# Patient Record
Sex: Male | Born: 1980 | Race: White | Hispanic: No | Marital: Single | Smoking: Current every day smoker
Health system: Southern US, Community
[De-identification: ages and names within clinical notes are randomized; demographics above are authoritative.]

## PROBLEM LIST (undated history)

## (undated) DIAGNOSIS — R634 Abnormal weight loss: Secondary | ICD-10-CM

## (undated) DIAGNOSIS — C801 Malignant (primary) neoplasm, unspecified: Secondary | ICD-10-CM

## (undated) DIAGNOSIS — C189 Malignant neoplasm of colon, unspecified: Secondary | ICD-10-CM

## (undated) DIAGNOSIS — Z800 Family history of malignant neoplasm of digestive organs: Secondary | ICD-10-CM

## (undated) DIAGNOSIS — K219 Gastro-esophageal reflux disease without esophagitis: Secondary | ICD-10-CM

## (undated) DIAGNOSIS — D649 Anemia, unspecified: Secondary | ICD-10-CM

## (undated) DIAGNOSIS — R630 Anorexia: Secondary | ICD-10-CM

## (undated) DIAGNOSIS — Z803 Family history of malignant neoplasm of breast: Secondary | ICD-10-CM

## (undated) DIAGNOSIS — J45909 Unspecified asthma, uncomplicated: Secondary | ICD-10-CM

## (undated) DIAGNOSIS — R110 Nausea: Secondary | ICD-10-CM

## (undated) DIAGNOSIS — R109 Unspecified abdominal pain: Secondary | ICD-10-CM

## (undated) HISTORY — DX: Family history of malignant neoplasm of breast: Z80.3

## (undated) HISTORY — DX: Malignant neoplasm of colon, unspecified: C18.9

## (undated) HISTORY — DX: Gastro-esophageal reflux disease without esophagitis: K21.9

## (undated) HISTORY — DX: Family history of malignant neoplasm of digestive organs: Z80.0

## (undated) HISTORY — PX: NO PAST SURGERIES: SHX2092

## (undated) HISTORY — DX: Unspecified asthma, uncomplicated: J45.909

---
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
## 2012-02-15 ENCOUNTER — Ambulatory Visit: Payer: Self-pay | Admitting: Internal Medicine

## 2012-04-08 ENCOUNTER — Other Ambulatory Visit: Payer: Self-pay | Admitting: Pediatrics

## 2015-02-21 LAB — HM HIV SCREENING LAB: HM HIV Screening: NEGATIVE

## 2015-07-15 DIAGNOSIS — A630 Anogenital (venereal) warts: Secondary | ICD-10-CM | POA: Insufficient documentation

## 2016-07-02 ENCOUNTER — Emergency Department
Admission: EM | Admit: 2016-07-02 | Discharge: 2016-07-02 | Disposition: A | Discharge status: Home / Self Care | Payer: Self-pay | Attending: Emergency Medicine | Admitting: Emergency Medicine

## 2016-07-02 ENCOUNTER — Emergency Department: Payer: Self-pay

## 2016-07-02 DIAGNOSIS — K529 Noninfective gastroenteritis and colitis, unspecified: Secondary | ICD-10-CM | POA: Insufficient documentation

## 2016-07-02 DIAGNOSIS — F17200 Nicotine dependence, unspecified, uncomplicated: Secondary | ICD-10-CM | POA: Insufficient documentation

## 2016-07-02 LAB — URINALYSIS, COMPLETE (UACMP) WITH MICROSCOPIC
BACTERIA UA: NONE SEEN
BILIRUBIN URINE: NEGATIVE
Glucose, UA: NEGATIVE mg/dL
Hgb urine dipstick: NEGATIVE
KETONES UR: NEGATIVE mg/dL
LEUKOCYTES UA: NEGATIVE
NITRITE: NEGATIVE
Protein, ur: NEGATIVE mg/dL
Specific Gravity, Urine: 1.008 (ref 1.005–1.030)
Squamous Epithelial / LPF: NONE SEEN
WBC, UA: NONE SEEN WBC/hpf (ref 0–5)
pH: 7 (ref 5.0–8.0)

## 2016-07-02 LAB — COMPREHENSIVE METABOLIC PANEL
ALT: 11 U/L — ABNORMAL LOW (ref 17–63)
ANION GAP: 7 (ref 5–15)
AST: 16 U/L (ref 15–41)
Albumin: 3.9 g/dL (ref 3.5–5.0)
Alkaline Phosphatase: 96 U/L (ref 38–126)
BILIRUBIN TOTAL: 0.5 mg/dL (ref 0.3–1.2)
BUN: 7 mg/dL (ref 6–20)
CO2: 30 mmol/L (ref 22–32)
Calcium: 8.9 mg/dL (ref 8.9–10.3)
Chloride: 100 mmol/L — ABNORMAL LOW (ref 101–111)
Creatinine, Ser: 0.6 mg/dL — ABNORMAL LOW (ref 0.61–1.24)
GFR calc Af Amer: >60 mL/min (ref >60)
Glucose, Bld: 100 mg/dL — ABNORMAL HIGH (ref 65–99)
Potassium: 4.1 mmol/L (ref 3.5–5.1)
Sodium: 137 mmol/L (ref 135–145)
TOTAL PROTEIN: 7.6 g/dL (ref 6.5–8.1)

## 2016-07-02 LAB — CBC
HEMATOCRIT: 41.2 % (ref 40.0–52.0)
HEMOGLOBIN: 13.8 g/dL (ref 13.0–18.0)
MCH: 29.3 pg (ref 26.0–34.0)
MCHC: 33.5 g/dL (ref 32.0–36.0)
MCV: 87.4 fL (ref 80.0–100.0)
Platelets: 221 10*3/uL (ref 150–440)
RBC: 4.71 MIL/uL (ref 4.40–5.90)
RDW: 13.5 % (ref 11.5–14.5)
WBC: 10.9 10*3/uL — AB (ref 3.8–10.6)

## 2016-07-02 LAB — LIPASE, BLOOD: Lipase: 17 U/L (ref 11–51)

## 2016-07-02 MED ORDER — IOPAMIDOL (ISOVUE-300) INJECTION 61%
30.0000 mL | Freq: Once | INTRAVENOUS | Status: AC | PRN
  Administered 2016-07-02: 30 mL via ORAL
  Filled 2016-07-02: qty 30

## 2016-07-02 MED ORDER — CIPROFLOXACIN HCL 500 MG PO TABS: 500.0000 mg | ORAL_TABLET | Freq: Two times a day (BID) | ORAL | 0 refills | Status: AC

## 2016-07-02 MED ORDER — IOPAMIDOL (ISOVUE-300) INJECTION 61%
100.0000 mL | Freq: Once | INTRAVENOUS | Status: AC | PRN
  Administered 2016-07-02: 100 mL via INTRAVENOUS
  Filled 2016-07-02: qty 100

## 2016-07-02 MED ORDER — METRONIDAZOLE 500 MG PO TABS: 500.0000 mg | ORAL_TABLET | Freq: Three times a day (TID) | ORAL | 0 refills | Status: AC

## 2016-07-02 MED ORDER — HYDROCODONE-ACETAMINOPHEN 5-325 MG PO TABS: 1.0000 | ORAL_TABLET | ORAL | 0 refills | Status: DC | PRN

## 2016-07-02 NOTE — Discharge Instructions (Signed)
Please take your antibiotics as prescribed there entire course. Please call gastroenterology tomorrow to arrange a follow-up appointment as soon as possible to discuss further workup such as a colonoscopy to rule out inflammatory bowel disease such as Crohn's disease or ulcerative colitis. Return to the emergency department for any significant increase in abdominal pain, fever, or any other symptom personally concerning to yourself.

## 2016-07-02 NOTE — ED Notes (Signed)
Return from CT Scan.  AAOx3.  Skin warm and dry. NAD

## 2016-07-02 NOTE — ED Triage Notes (Signed)
Pt arrived via POV from [facility]. Pt sent for evaluation of LLQ abdominal pain and liquid stools. [facility] told pt he needed a CT scan. [facility] reports urinalysis normal.

## 2016-07-02 NOTE — ED Notes (Signed)
Discussed discharge instructions, prescriptions, and follow-up care with patient. No questions or concerns at this time. Pt stable at discharge.  

## 2016-07-02 NOTE — ED Provider Notes (Signed)
[facility] Emergency Department Provider Note  Time seen: 3:27 PM  I have reviewed the triage vital signs and the nursing notes.   HISTORY  Chief Complaint Groin Pain    HPI Tristan [name] is a 36 y.o. male with no past medical history who presents to the emergency department for lower abdominal pain. According to the patient for the past week he has been expressing pain in his left lower quadrant and lower abdomen. States this has happened twice previously about a year ago they state it was self-limited and only lasts a couple days. He states the pain is moderate, worse with any type of exertion or heavy lifting. Denies diarrhea, contrary to triage note. Denies nausea, vomiting, fever, dysuria, hematuria, penile discharge or scrotal edema. Denies any genital pain. Patient describes the pain as a pressure/aching pain located in the lower abdomen to left lower quadrant. Patient went to urgent care was referred to the emergency department for further evaluation.  History reviewed. No pertinent past medical history.  There are no active problems to display for this patient.   History reviewed. No pertinent surgical history.  Prior to Admission medications   Not on File    Not on File  History reviewed. No pertinent family history.  Social History Social History  Substance Use Topics  . Smoking status: Current Every Day Smoker  . Smokeless tobacco: Not on file  . Alcohol use Yes    Review of Systems Constitutional: Negative for fever. Cardiovascular: Negative for chest pain. Respiratory: Negative for shortness of breath. Gastrointestinal: Positive for lower abdominal pain. Negative for nausea vomiting and diarrhea Genitourinary: Negative for dysuria. Musculoskeletal: Negative for back pain. Neurological: Negative for headache All other ROS negative  ____________________________________________   PHYSICAL EXAM:  VITAL SIGNS: ED Triage Vitals   Enc Vitals Group     BP 07/02/16 1255 130/72     Pulse Rate 07/02/16 1255 83     Resp 07/02/16 1255 18     Temp 07/02/16 1255 98.7 F (37.1 C)     Temp Source 07/02/16 1255 Oral     SpO2 07/02/16 1255 98 %     Weight 07/02/16 1255 160 lb (72.6 kg)     Height 07/02/16 1255 6\' 1"  (1.854 m)     Head Circumference --      Peak Flow --      Pain Score 07/02/16 1311 1     Pain Loc --      Pain Edu? --      Excl. in [place]? --     Constitutional: Alert and oriented. Well appearing and in no distress. Eyes: Normal exam ENT   Head: Normocephalic and atraumatic.   Mouth/Throat: Mucous membranes are moist. Cardiovascular: Normal rate, regular rhythm. No murmur Respiratory: Normal respiratory effort without tachypnea nor retractions. Breath sounds are clear Gastrointestinal: Soft and nontender. No distention.  Musculoskeletal: Nontender with normal range of motion in all extremities.  Neurologic:  Normal speech and language. No gross focal neurologic deficits  Skin:  Skin is warm, dry and intact.  Psychiatric: Mood and affect are normal.   ____________________________________________     RADIOLOGY  IMPRESSION: 1. Prominent inflammatory findings in the sigmoid colon and extending into the rectum, with extensive wall thickening and perirectal stranding. There are a few scattered diverticula along the sigmoid colon, although I am uncertain that the findings are due to diverticulitis versus a different cause of prominent distal colitis. Inflammatory bowel disease is not readily  excluded. Although there is edema and a small amount of fluid in the sigmoid mesocolon, there is no current drainable abscess or extraluminal gas identified. 2. Scattered small retroperitoneal lymph nodes and mesenteric lymph nodes are likely reactive. 3. 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 Advanced for age lumbar spondylosis and degenerative  disc disease.  ____________________________________________   INITIAL IMPRESSION / ASSESSMENT AND PLAN / ED COURSE  Pertinent labs & imaging results that were available during my care of the patient were reviewed by me and considered in my medical decision making (see chart for details).  Patient presents the emergency department for lower abdominal pain and left lower quadrant pain ongoing for the past one week. Denies any nausea, vomiting, diarrhea, black or bloody stool, dysuria or hematuria. Patient's labs show a slight white blood cell count elevation, otherwise are largely normal. Given the patient's ongoing pain I discussed treating the discomfort with watchful waiting versus a CT scan. Patient would much rather prefer to have a CT scan to help rule out intra-abdominal pathology.  CT results are concerning for colitis versus inflammatory bowel disease. Patient states a similar event occurred approximately one year ago lasting for several days and then resolved on its own, this would also be concerning for possible inflammatory bowel disease. No known family history per patient. We will cover with antibiotics Flagyl and ciprofloxacin. I refer the patient to GI medicine for further evaluation and colonoscopy. Patient agreeable to plan.  ____________________________________________   FINAL CLINICAL IMPRESSION(S) / ED DIAGNOSES  Lower abdominal pain Colitis   Harvest Dark, MD 07/02/16 1626

## 2016-07-02 NOTE — ED Triage Notes (Signed)
Pt [facility] here from [facility] for a CT of "my groin area." states has been having sharp pains lower abd/groin. Been taking gas pills that help relieve for a little bit. Symptoms x 2 weeks.

## 2016-07-10 ENCOUNTER — Ambulatory Visit (INDEPENDENT_AMBULATORY_CARE_PROVIDER_SITE_OTHER): Payer: BLUE CROSS/BLUE SHIELD | Admitting: Gastroenterology

## 2016-07-10 ENCOUNTER — Telehealth: Payer: Self-pay

## 2016-07-10 ENCOUNTER — Encounter: Payer: Self-pay | Admitting: Gastroenterology

## 2016-07-10 ENCOUNTER — Other Ambulatory Visit
Admission: RE | Admit: 2016-07-10 | Discharge: 2016-07-10 | Disposition: A | Discharge status: Home / Self Care | Payer: BLUE CROSS/BLUE SHIELD | Source: Ambulatory Visit | Attending: Gastroenterology | Admitting: Gastroenterology

## 2016-07-10 ENCOUNTER — Other Ambulatory Visit: Payer: Self-pay

## 2016-07-10 VITALS — BP 141/87 | HR 103 | Temp 98.6°F | Resp 18 | Ht 73.0 in | Wt 169.6 lb

## 2016-07-10 DIAGNOSIS — K529 Noninfective gastroenteritis and colitis, unspecified: Secondary | ICD-10-CM

## 2016-07-10 DIAGNOSIS — K5190 Ulcerative colitis, unspecified, without complications: Secondary | ICD-10-CM

## 2016-07-10 LAB — C-REACTIVE PROTEIN: CRP: 1.9 mg/dL — ABNORMAL HIGH (ref <1.0)

## 2016-07-10 NOTE — Telephone Encounter (Signed)
Gastroenterology Pre-Procedure Review  Request Date: 6/7  Requesting Physician: Dr. Vicente Males  PATIENT REVIEW QUESTIONS: The patient responded to the following health history questions as indicated:    1. Are you having any GI issues? yes (colitis) 2. Do you have a personal history of Polyps? no 3. Do you have a family history of Colon Cancer or Polyps? yes (family: colon cancer) 4. Diabetes Mellitus? no 5. Joint replacements in the past 12 months?no 6. Major health problems in the past 3 months?no 7. Any artificial heart valves, MVP, or defibrillator?no    MEDICATIONS & ALLERGIES:    Patient reports the following regarding taking any anticoagulation/antiplatelet therapy:   Plavix, Coumadin, Eliquis, Xarelto, Lovenox, Pradaxa, Brilinta, or Effient? no Aspirin? no  Patient confirms/reports the following medications:  Current Outpatient Prescriptions  Medication Sig Dispense Refill  . ciprofloxacin (CIPRO) 500 MG tablet Take 1 tablet (500 mg total) by mouth 2 (two) times daily. 28 tablet 0  . HYDROcodone-acetaminophen (NORCO/VICODIN) 5-325 MG tablet Take 1 tablet by mouth every 4 (four) hours as needed. (Patient not taking: Reported on 07/10/2016) 15 tablet 0  . metroNIDAZOLE (FLAGYL) 500 MG tablet Take 1 tablet (500 mg total) by mouth 3 (three) times daily. (Patient not taking: Reported on 07/10/2016) 42 tablet 0  . PROAIR HFA 108 (90 Base) MCG/ACT inhaler     . promethazine-dextromethorphan (PROMETHAZINE-DM) 6.25-15 MG/5ML syrup      No current facility-administered medications for this visit.     Patient confirms/reports the following allergies:  No Known Allergies  No orders of the defined types were placed in this encounter.   AUTHORIZATION INFORMATION Primary Insurance: 1D#: Group #:  Secondary Insurance: 1D#: Group #:  SCHEDULE INFORMATION: Date: 6/7 Time: Location:  [facility]

## 2016-07-10 NOTE — Progress Notes (Signed)
Gastroenterology Consultation  Referring Provider: Dr Kerman Passey   Primary Care Physician:  No PCP Per Patient Primary Gastroenterologist:  Dr. [name]  Reason for Consultation:     Colitis        HPI:   Tristan [name] is a 36 y.o. y/o male  referred for abdominal pain. He was seen at the ER on 07/02/16 for abdominal pain . He underwent a CT scan of the abdomen which showed inflammation in the sigmoid colon extending into the rectum - it wasn't clear per radiologist if changes are from colitis or diverticulitis. Small retroperitoneal and mesenteric lymph nodes were seen . He was given a course of flagyl and ciprofloxacin with plan to follow up with GI.  He says that it has happened once before 5 years back and was seen at the urgent care, was treated for gas , on another occasion was told he may have been constipated and was treated with lactulose.   Abdominal pain: Onset: 2.5 weeks, gradually getting better, after starting antibiotics feeling better. Flagyl caused him to have back pain which he stopped. , pain was on and off, occurring more often . Now its almost gone.  Site :groins  Radiation: back   Aggravating factors: cant recall  Weight loss: no  NSAID use: advil- 4-6 per day for one day  PPI use :no  Gall bladder surgery: no  Frequency of bowel movements: 2-3 times a day which is his normal , no blood, no diarrhea  Change in bowel movements: no  Gas/Bloating/Abdominal distension: yes  No past medical history on file.  No past surgical history on file.  Prior to Admission medications   Medication Sig Start Date End Date Taking? Authorizing Provider  ciprofloxacin (CIPRO) 500 MG tablet Take 1 tablet (500 mg total) by mouth 2 (two) times daily. 07/02/16 07/16/16  Harvest Dark, MD  HYDROcodone-acetaminophen (NORCO/VICODIN) 5-325 MG tablet Take 1 tablet by mouth every 4 (four) hours as needed. Patient not taking: Reported on 07/10/2016 07/02/16   Harvest Dark, MD    metroNIDAZOLE (FLAGYL) 500 MG tablet Take 1 tablet (500 mg total) by mouth 3 (three) times daily. Patient not taking: Reported on 07/10/2016 07/02/16 07/16/16  Harvest Dark, MD  PROAIR HFA 108 [phone] Base) MCG/ACT inhaler  05/18/16   Historical Provider, MD  promethazine-dextromethorphan (PROMETHAZINE-DM) 6.25-15 MG/5ML syrup  05/18/16   Historical Provider, MD    No family history on file.   Social History  Substance Use Topics  . Smoking status: Current Every Day Smoker  . Smokeless tobacco: Not on file  . Alcohol use Yes    Allergies as of 07/10/2016  . (No Known Allergies)    Review of Systems:    All systems reviewed and negative except where noted in HPI.   Physical Exam:  BP (!) 141/87 (Cuff Size: Normal)   Pulse (!) 103   Temp 98.6 F (37 C) (Oral)   Resp 18   Ht 6\' 1"  (1.854 m)   Wt 169 lb 9.6 oz (76.9 kg)   BMI 22.38 kg/m  No LMP for male patient. Psych:  Alert and cooperative. Normal mood and affect. General:   Alert,  Well-developed, well-nourished, pleasant and cooperative in NAD Head:  Normocephalic and atraumatic. Eyes:  Sclera clear, no icterus.   Conjunctiva pink. Ears:  Normal auditory acuity. Nose:  No deformity, discharge, or lesions. Mouth:  No deformity or lesions,oropharynx pink & moist. Neck:  Supple; no masses or thyromegaly. Lungs:  Respirations even  and unlabored.  Clear throughout to auscultation.   No wheezes, crackles, or rhonchi. No acute distress. Heart:  Regular rate and rhythm; no murmurs, clicks, rubs, or gallops. Abdomen:  Normal bowel sounds.  No bruits.  Soft, non-tender and non-distended without masses, hepatosplenomegaly or hernias noted.  No guarding or rebound tenderness.    Msk:  Symmetrical without gross deformities. Good, equal movement & strength bilaterally. Pulses:  Normal pulses noted. Extremities:  No clubbing or edema.  No cyanosis. Neurologic:  Alert and oriented x3;  grossly normal neurologically. Skin:  Intact without  significant lesions or rashes.Tatooes over arms  Psych:  Alert and cooperative. Normal mood and affect.  Imaging Studies: Ct Abdomen Pelvis W Contrast  Result Date: 07/02/2016 CLINICAL DATA:  Left lower quadrant abdominal pain and liquid stools for 2 weeks. EXAM: CT ABDOMEN AND PELVIS WITH CONTRAST TECHNIQUE: Multidetector CT imaging of the abdomen and pelvis was performed using the standard protocol following bolus administration of intravenous contrast. CONTRAST:  126mL ISOVUE-300 IOPAMIDOL (ISOVUE-300) INJECTION 61% COMPARISON:  None. FINDINGS: Lower chest: Unremarkable Hepatobiliary: Unremarkable Pancreas: Unremarkable Spleen: Unremarkable Adrenals/Urinary Tract: Unremarkable Stomach/Bowel: Prominent wall thickening and wall indistinctness in the sigmoid colon favoring prominent active inflammation. This extends down into the rectum although is more severe in the sigmoid colon, with considerable surrounding stranding. There are a few diverticula in the seat the sigmoid colon. Although there is mild fluid in the adjacent mesentery I do not see a drainable abscess or extraluminal gas. Vascular/Lymphatic: Upper normal sized retroperitoneal lymph nodes are most likely to be reactive. Reproductive: Unremarkable Other: No supplemental non-categorized findings. Musculoskeletal: Bilateral chronic pars defects at L5 with 3 mm grade 1 anterolisthesis at L5-S1. There is also lumbar spondylosis and degenerative disc disease. IMPRESSION: 1. Prominent inflammatory findings in the sigmoid colon and extending into the rectum, with extensive wall thickening and perirectal stranding. There are a few scattered diverticula along the sigmoid colon, although I am uncertain that the findings are due to diverticulitis versus a different cause of prominent distal colitis. Inflammatory bowel disease is not readily excluded. Although there is edema and a small amount of fluid in the sigmoid mesocolon, there is no current drainable  abscess or extraluminal gas identified. 2. Scattered small retroperitoneal lymph nodes and mesenteric lymph nodes are likely reactive. 3. 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 Advanced for age lumbar spondylosis and degenerative disc disease. Electronically Signed   By: Van Clines M.D.   On: 07/02/2016 16:21    Assessment and Plan:   Tristan [name] is a 36 y.o. y/o male has been referred for  colitis seen on the CT scan on the left side of the colon. The Ct scan findings are indeterminant and similar findings can be seen in IBD, diverticulitis or colitis associated with diverticular disease.   Plan  1. Fecal calpoprectin.  2. CRP 3. Colonoscopy in 6 weeks time. If has worsening of symptoms in the meanwhile then will need a flexible sigmoidoscopy.   Follow up in 8 weeks   Dr [name] MD

## 2016-07-11 ENCOUNTER — Telehealth: Payer: Self-pay | Admitting: Gastroenterology

## 2016-07-11 NOTE — Telephone Encounter (Signed)
07/10/16 Faxed Prior Auth to [facility] for Colonoscopy [phone] / Colitis K52.9.

## 2016-07-23 ENCOUNTER — Other Ambulatory Visit
Admission: RE | Admit: 2016-07-23 | Discharge: 2016-07-23 | Disposition: A | Discharge status: Home / Self Care | Payer: BLUE CROSS/BLUE SHIELD | Source: Ambulatory Visit | Attending: Gastroenterology | Admitting: Gastroenterology

## 2016-07-23 DIAGNOSIS — K529 Noninfective gastroenteritis and colitis, unspecified: Secondary | ICD-10-CM | POA: Insufficient documentation

## 2016-07-25 LAB — MISC LABCORP TEST (SEND OUT): LABCORP TEST CODE: 123255

## 2016-08-16 ENCOUNTER — Encounter: Payer: Self-pay | Admitting: *Deleted

## 2016-08-16 ENCOUNTER — Ambulatory Visit: Payer: BLUE CROSS/BLUE SHIELD | Admitting: Anesthesiology

## 2016-08-16 ENCOUNTER — Encounter
Admission: RE | Disposition: A | Discharge status: Home / Self Care | Payer: Self-pay | Source: Ambulatory Visit | Attending: Gastroenterology

## 2016-08-16 ENCOUNTER — Ambulatory Visit
Admission: RE | Admit: 2016-08-16 | Discharge: 2016-08-16 | Disposition: A | Discharge status: Home / Self Care | Payer: BLUE CROSS/BLUE SHIELD | Source: Ambulatory Visit | Attending: Gastroenterology | Admitting: Gastroenterology

## 2016-08-16 DIAGNOSIS — K529 Noninfective gastroenteritis and colitis, unspecified: Secondary | ICD-10-CM | POA: Diagnosis present

## 2016-08-16 DIAGNOSIS — F17200 Nicotine dependence, unspecified, uncomplicated: Secondary | ICD-10-CM | POA: Diagnosis not present

## 2016-08-16 DIAGNOSIS — K219 Gastro-esophageal reflux disease without esophagitis: Secondary | ICD-10-CM | POA: Diagnosis not present

## 2016-08-16 DIAGNOSIS — R933 Abnormal findings on diagnostic imaging of other parts of digestive tract: Secondary | ICD-10-CM | POA: Diagnosis not present

## 2016-08-16 DIAGNOSIS — K5190 Ulcerative colitis, unspecified, without complications: Secondary | ICD-10-CM

## 2016-08-16 HISTORY — PX: COLONOSCOPY WITH PROPOFOL: SHX5780

## 2016-08-16 SURGERY — COLONOSCOPY WITH PROPOFOL
Anesthesia: General

## 2016-08-16 MED ORDER — FENTANYL CITRATE (PF) 100 MCG/2ML IJ SOLN
INTRAMUSCULAR | Status: AC
  Filled 2016-08-16: qty 2

## 2016-08-16 MED ORDER — PROPOFOL 500 MG/50ML IV EMUL
INTRAVENOUS | Status: DC | PRN
  Administered 2016-08-16: 120 ug/kg/min via INTRAVENOUS

## 2016-08-16 MED ORDER — PROPOFOL 500 MG/50ML IV EMUL
INTRAVENOUS | Status: AC
  Filled 2016-08-16: qty 50

## 2016-08-16 MED ORDER — FENTANYL CITRATE (PF) 100 MCG/2ML IJ SOLN
INTRAMUSCULAR | Status: DC | PRN
  Administered 2016-08-16 (×2): 50 ug via INTRAVENOUS

## 2016-08-16 MED ORDER — SODIUM CHLORIDE 0.9 % IV SOLN
INTRAVENOUS | Status: DC
  Administered 2016-08-16: 1000 mL via INTRAVENOUS

## 2016-08-16 MED ORDER — MIDAZOLAM HCL 2 MG/2ML IJ SOLN
INTRAMUSCULAR | Status: DC | PRN
  Administered 2016-08-16: 2 mg via INTRAVENOUS

## 2016-08-16 MED ORDER — MIDAZOLAM HCL 2 MG/2ML IJ SOLN
INTRAMUSCULAR | Status: AC
  Filled 2016-08-16: qty 2

## 2016-08-16 MED ORDER — LIDOCAINE HCL (PF) 1 % IJ SOLN
INTRAMUSCULAR | Status: AC
  Filled 2016-08-16: qty 2

## 2016-08-16 MED ORDER — LIDOCAINE HCL (PF) 1 % IJ SOLN: 2.0000 mL | Freq: Once | INTRAMUSCULAR | Status: DC

## 2016-08-16 NOTE — Op Note (Signed)
[facility] Gastroenterology Patient Name: Tristan [name] Procedure Date: 08/16/2016 8:47 AM MRN: [id number] Account #: [id number] Date of Birth: April 12, 1980 Admit Type: Ambulatory Age: 36 Room: [facility] ENDO ROOM 3 Gender: Male Note Status: Finalized Procedure:            Colonoscopy Indications:          Suspected left-sided chronic ulcerative colitis,                        Abnormal CT of the GI tract Providers:            [name] MD, MD Medicines:            Monitored Anesthesia Care Complications:        No immediate complications. Procedure:            Pre-Anesthesia Assessment:                       - Prior to the procedure, a History and Physical was                        performed, and patient medications, allergies and                        sensitivities were reviewed. The patient's tolerance of                        previous anesthesia was reviewed.                       - The risks and benefits of the procedure and the                        sedation options and risks were discussed with the                        patient. All questions were answered and informed                        consent was obtained.                       - ASA Grade Assessment: II - A patient with mild                        systemic disease.                       After obtaining informed consent, the colonoscope was                        passed under direct vision. Throughout the procedure,                        the patient's blood pressure, pulse, and oxygen                        saturations were monitored continuously. The                        Colonoscope was introduced through the anus with the  intention of advancing to the cecum. The scope was                        advanced to the sigmoid colon before the procedure was                        aborted. Medications were given. The colonoscopy was                        unusually difficult due to a  tortuous colon. Successful                        completion of the procedure was aided by increasing the                        dose of sedation medication, withdrawing the scope and                        replacing with the pediatric colonoscope, straightening                        and shortening the scope to obtain bowel loop reduction                        and applying abdominal pressure. The patient tolerated                        the procedure well. The quality of the bowel                        preparation was adequate. Findings:      The perianal and digital rectal examinations were normal.      Normal mucosa was found in the rectum and in the sigmoid colon. Biopsies       were taken with a cold forceps for histology. Impression:           - Normal mucosa in the rectum and in the sigmoid colon.                        Biopsied. Recommendation:       - Discharge patient to home (with escort).                       - Resume previous diet.                       - Continue present medications.                       - Await pathology results.                       - Suggest CT colonography to evaluate the remainder of                        the colon , unable to advance beyond sigmoid colon due                        to very sharp turn despite multiple manoevers                       -  Return to my office in 4 weeks. Procedure Code(s):    --- Professional ---                       [id number], 47, Colonoscopy, flexible; with biopsy, single                        or multiple Diagnosis Code(s):    --- Professional ---                       R93.3, Abnormal findings on diagnostic imaging of other                        parts of digestive tract CPT copyright 2016 American Medical Association. All rights reserved. The codes documented in this report are preliminary and upon coder review may  be revised to meet current compliance requirements. [name], MD [name] MD, MD 08/16/2016  9:21:50 AM This report has been signed electronically. Number of Addenda: 0 Note Initiated On: 08/16/2016 8:47 AM Total Procedure Duration: 0 hours 9 minutes 52 seconds       [facility]

## 2016-08-16 NOTE — H&P (Signed)
  [name] MD [address], [place], [place] [address] Phone: [phone] Fax : [phone]  Primary Care Physician:  Patient, No Pcp Per Primary Gastroenterologist:  Dr. [name]   Pre-Procedure History & Physical: HPI:  Tristan [name] is a 36 y.o. male is here for an colonoscopy.   History reviewed. No pertinent past medical history.  History reviewed. No pertinent surgical history.  Prior to Admission medications   Medication Sig Start Date End Date Taking? Authorizing Provider  HYDROcodone-acetaminophen (NORCO/VICODIN) 5-325 MG tablet Take 1 tablet by mouth every 4 (four) hours as needed. Patient not taking: Reported on 07/10/2016 07/02/16   Harvest Dark, MD  [facility] HFA 108 [phone] Base) MCG/ACT inhaler  05/18/16   [provider]  promethazine-dextromethorphan (PROMETHAZINE-DM) 6.25-15 MG/5ML syrup  05/18/16   [provider]    Allergies as of 07/10/2016  . (No Known Allergies)    History reviewed. No pertinent family history.  Social History   Social History  . Marital status: Single    Spouse name: N/A  . Number of children: N/A  . Years of education: N/A   Occupational History  . Not on file.   Social History Main Topics  . Smoking status: Current Every Day Smoker    Packs/day: 1.00  . Smokeless tobacco: Never Used  . Alcohol use Yes  . Drug use: No  . Sexual activity: Yes    Birth control/ protection: Condom   Other Topics Concern  . Not on file   Social History Narrative  . No narrative on file    Review of Systems: See HPI, otherwise negative ROS  Physical Exam: BP 122/77   Pulse 76   Temp (!) 96.3 F (35.7 C) (Tympanic)   Resp 17   Ht 6\' 1"  (1.854 m)   Wt 160 lb (72.6 kg)   SpO2 100%   BMI 21.11 kg/m  General:   Alert,  pleasant and cooperative in NAD Head:  Normocephalic and atraumatic. Neck:  Supple; no masses or thyromegaly. Lungs:  Clear throughout to auscultation.    Heart:  Regular rate and  rhythm. Abdomen:  Soft, nontender and nondistended. Normal bowel sounds, without guarding, and without rebound.   Neurologic:  Alert and  oriented x4;  grossly normal neurologically.  Impression/Plan: [name] is here for an colonoscopy to be performed for colitis  Risks, benefits, limitations, and alternatives regarding  colonoscopy have been reviewed with the patient.  Questions have been answered.  All parties agreeable.   [name], MD  08/16/2016, 8:47 AM

## 2016-08-16 NOTE — Anesthesia Procedure Notes (Signed)
Performed by: COOK-MARTIN, Giovanni Biby Pre-anesthesia Checklist: Patient identified, Emergency Drugs available, Suction available, Patient being monitored and Timeout performed Patient Re-evaluated:Patient Re-evaluated prior to inductionOxygen Delivery Method: Nasal cannula Preoxygenation: Pre-oxygenation with 100% oxygen Intubation Type: IV induction Placement Confirmation: positive ETCO2 and CO2 detector       

## 2016-08-16 NOTE — Transfer of Care (Signed)
Immediate Anesthesia Transfer of Care Note  Patient: Tristan [name]  Procedure(s) Performed: Procedure(s): COLONOSCOPY WITH PROPOFOL (N/A)  Patient Location: PACU  Anesthesia Type:General  Level of Consciousness: awake and sedated  Airway & Oxygen Therapy: Patient Spontanous Breathing and Patient connected to nasal cannula oxygen  Post-op Assessment: Report given to RN and Post -op Vital signs reviewed and stable  Post vital signs: Reviewed and stable  Last Vitals:  Vitals:   08/16/16 0757  BP: 122/77  Pulse: 76  Resp: 17  Temp: (!) 35.7 C    Last Pain:  Vitals:   08/16/16 0757  TempSrc: Tympanic         Complications: No apparent anesthesia complications

## 2016-08-16 NOTE — Anesthesia Preprocedure Evaluation (Addendum)
Anesthesia Evaluation  Patient identified by MRN, date of birth, ID band Patient awake    Reviewed: Allergy & Precautions, H&P , NPO status , Patient's Chart, lab work & pertinent test results, reviewed documented beta blocker date and time   History of Anesthesia Complications Negative for: history of anesthetic complications  Airway Mallampati: I  TM Distance: >3 FB Neck ROM: full    Dental  (+) Dental Advidsory Given, Missing, Chipped, Teeth Intact   Pulmonary neg shortness of breath, neg COPD, neg recent URI, Current Smoker,           Cardiovascular Exercise Tolerance: Good negative cardio ROS       Neuro/Psych negative neurological ROS  negative psych ROS   GI/Hepatic Neg liver ROS, GERD  ,  Endo/Other  negative endocrine ROS  Renal/GU negative Renal ROS  negative genitourinary   Musculoskeletal   Abdominal   Peds  Hematology negative hematology ROS (+)   Anesthesia Other Findings History reviewed. No pertinent past medical history.   Reproductive/Obstetrics negative OB ROS                             Anesthesia Physical Anesthesia Plan  ASA: II  Anesthesia Plan: General   Post-op Pain Management:    Induction:   PONV Risk Score and Plan: 1 and Treatment may vary due to age and Propofol  Airway Management Planned:   Additional Equipment:   Intra-op Plan:   Post-operative Plan:   Informed Consent: I have reviewed the patients History and Physical, chart, labs and discussed the procedure including the risks, benefits and alternatives for the proposed anesthesia with the patient or authorized representative who has indicated his/her understanding and acceptance.   Dental Advisory Given  Plan Discussed with: Anesthesiologist, CRNA and Surgeon  Anesthesia Plan Comments:         Anesthesia Quick Evaluation

## 2016-08-16 NOTE — Anesthesia Postprocedure Evaluation (Signed)
Anesthesia Post Note  Patient: Tristan [name]  Procedure(s) Performed: Procedure(s) (LRB): COLONOSCOPY WITH PROPOFOL (N/A)  Patient location during evaluation: Endoscopy Anesthesia Type: General Level of consciousness: awake and alert Pain management: pain level controlled Vital Signs Assessment: post-procedure vital signs reviewed and stable Respiratory status: spontaneous breathing, nonlabored ventilation, respiratory function stable and patient connected to nasal cannula oxygen Cardiovascular status: blood pressure returned to baseline and stable Postop Assessment: no signs of nausea or vomiting Anesthetic complications: no     Last Vitals:  Vitals:   08/16/16 0944 08/16/16 0954  BP: 114/81 126/76  Pulse: 63 63  Resp: 18 17  Temp:      Last Pain:  Vitals:   08/16/16 0924  TempSrc: Tympanic  PainSc: Asleep                 Martha Clan

## 2016-08-16 NOTE — Anesthesia Post-op Follow-up Note (Cosign Needed)
Anesthesia QCDR form completed.        

## 2016-08-17 ENCOUNTER — Encounter: Payer: Self-pay | Admitting: Gastroenterology

## 2016-08-17 LAB — SURGICAL PATHOLOGY

## 2016-08-23 ENCOUNTER — Telehealth: Payer: Self-pay

## 2016-08-23 NOTE — Telephone Encounter (Signed)
Patient's mother came in for work note.

## 2016-08-28 ENCOUNTER — Other Ambulatory Visit: Payer: Self-pay

## 2016-08-28 ENCOUNTER — Telehealth: Payer: Self-pay

## 2016-08-28 DIAGNOSIS — K5190 Ulcerative colitis, unspecified, without complications: Secondary | ICD-10-CM

## 2016-08-28 NOTE — Telephone Encounter (Signed)
LVM for patient callback for results per Dr. Vicente Males.   Inform rectal bx were normal- Have we arranged for a CT colonography for him due to an incomplete colonoscopy  Ordered CT colonography. Waiting for patient callback prior to scheduling.

## 2016-09-04 ENCOUNTER — Telehealth: Payer: Self-pay

## 2016-09-04 NOTE — Telephone Encounter (Signed)
-----   Message from [name], MD sent at 08/27/2016 10:42 AM EDT ----- Inform rectal bx were normal- Have we arranged for a CT colonography for him due to an incomplete colonoscopy

## 2016-09-04 NOTE — Telephone Encounter (Signed)
Advised patient of results per Dr. Vicente Males.   Inform rectal bx were normal- Have we arranged for a CT colonography for him due to an incomplete colonoscopy   Patient to contact [employer] and verify payment coverage for procedure.   Patient states received $5000+ ER billing that was not submitted to insurance. Advised patient to contact billing and have charges resubmitted for payment since insurance was active during time of visit, per patient.

## 2016-09-26 ENCOUNTER — Telehealth: Payer: Self-pay

## 2016-09-26 ENCOUNTER — Ambulatory Visit (INDEPENDENT_AMBULATORY_CARE_PROVIDER_SITE_OTHER): Payer: BLUE CROSS/BLUE SHIELD | Admitting: Gastroenterology

## 2016-09-26 ENCOUNTER — Encounter: Payer: Self-pay | Admitting: Gastroenterology

## 2016-09-26 VITALS — BP 149/79 | HR 94 | Temp 98.4°F | Ht 73.0 in | Wt 167.4 lb

## 2016-09-26 DIAGNOSIS — K529 Noninfective gastroenteritis and colitis, unspecified: Secondary | ICD-10-CM

## 2016-09-26 NOTE — Progress Notes (Signed)
[name] MD, MRCP(U.K) [address]  [place]  [place], [place] [address]  Main: [phone]  Fax: [phone]   Primary Care Physician: Patient, No Pcp Per  Primary Gastroenterologist:  Dr. [name]   No chief complaint on file.   HPI: Tristan [name] is a 36 y.o. male   Summary of history : He was initially seen on 07/10/16 when he was referred for  abdominal pain. He was seen at the ER on 07/02/16 for abdominal pain . He underwent a CT scan of the abdomen which showed inflammation in the sigmoid colon extending into the rectum - it wasn't clear per radiologist if changes are from colitis or diverticulitis. Small retroperitoneal and mesenteric lymph nodes were seen . He was given a course of flagyl and ciprofloxacin with plan to follow up with GI.  He says that it has happened once before 5 years back and was seen at the urgent care, was treated for gas , on another occasion was told he may have been constipated and was treated with lactulose. At his initial visit he had abdominal pain for < 3 weeks and was gradually getting better, he was on advuls, having 2-3 bowel movements    Interval history   07/10/2016-  09/26/2016   CRP 1.9  Fecal lactoferrin was 255 I attempted a colonoscopy but he had a very tight and tortious sigmoid colon which I could not go past, I had to abort the procedure . The limited visualization of the mucosa appeared normal. I attempted to obtain a CT colonography but was denies by insurance.   Rectal biopsies were normal   Having normal bowel movements , on average having 3-5 bowel movements . Drinking a lot of alcohol . Denies any blood in the stool, no abdominal pain , stool is thin in caliber.   Current Outpatient Prescriptions  Medication Sig Dispense Refill  . HYDROcodone-acetaminophen (NORCO/VICODIN) 5-325 MG tablet Take 1 tablet by mouth every 4 (four) hours as needed. (Patient not taking: Reported on 07/10/2016) 15 tablet 0  . PROAIR HFA  108 (90 Base) MCG/ACT inhaler     . promethazine-dextromethorphan (PROMETHAZINE-DM) 6.25-15 MG/5ML syrup      No current facility-administered medications for this visit.     Allergies as of 09/26/2016  . (No Known Allergies)    ROS:  General: Negative for anorexia, weight loss, fever, chills, fatigue, weakness. ENT: Negative for hoarseness, difficulty swallowing , nasal congestion. CV: Negative for chest pain, angina, palpitations, dyspnea on exertion, peripheral edema.  Respiratory: Negative for dyspnea at rest, dyspnea on exertion, cough, sputum, wheezing.  GI: See history of present illness. GU:  Negative for dysuria, hematuria, urinary incontinence, urinary frequency, nocturnal urination.  Endo: Negative for unusual weight change.    Physical Examination:   There were no vitals taken for this visit.  General: Well-nourished, well-developed in no acute distress.  Eyes: No icterus. Conjunctivae pink. Mouth: Oropharyngeal mucosa moist and pink , no lesions erythema or exudate. Lungs: Clear to auscultation bilaterally. Non-labored. Heart: Regular rate and rhythm, no murmurs rubs or gallops.  Abdomen: Bowel sounds are normal, nontender, nondistended, no hepatosplenomegaly or masses, no abdominal bruits or hernia , no rebound or guarding.   Extremities: No lower extremity edema. No clubbing or deformities. Neuro: Alert and oriented x 3.  Grossly intact. Skin: Warm and dry, no jaundice.   Psych: Alert and cooperative, normal mood and affect.   Imaging Studies: No results found.  Assessment and Plan:  Tristan [name] is a 36 y.o. y/o male here to follow up for   colitis seen on the CT scan on the left side of the colon. The Ct scan findings are indeterminant and similar findings can be seen in IBD, diverticulitis or colitis associated with diverticular disease. Attempt at colonoscopy was unsuccessful as his sigmoid colon was very angulated/tortious and I could not get past .     Plan  1. Barium enema to evaluate sigmoid colon and if no obstruction will proceed with colon Pill cam which we may be able to obtain through a research study    Dr [name]  MD,MRCP [facility]) Follow up in 3 months

## 2016-09-26 NOTE — Telephone Encounter (Signed)
Advised patient of Barium enema scheduled for 7/26 @ [facility] @ 1015am.   Suprep sample left with receptionist for patient pick-up.

## 2016-09-26 NOTE — Addendum Note (Signed)
Addended by: Peggye Ley on: 09/26/2016 01:31 PM   Modules accepted: Orders

## 2016-10-01 ENCOUNTER — Ambulatory Visit: Payer: BLUE CROSS/BLUE SHIELD

## 2016-10-04 ENCOUNTER — Other Ambulatory Visit: Payer: Self-pay | Admitting: Gastroenterology

## 2016-10-04 ENCOUNTER — Ambulatory Visit
Admission: RE | Admit: 2016-10-04 | Discharge: 2016-10-04 | Disposition: A | Discharge status: Home / Self Care | Payer: BLUE CROSS/BLUE SHIELD | Source: Ambulatory Visit | Attending: Gastroenterology | Admitting: Gastroenterology

## 2016-10-04 DIAGNOSIS — K529 Noninfective gastroenteritis and colitis, unspecified: Secondary | ICD-10-CM | POA: Diagnosis not present

## 2017-10-21 ENCOUNTER — Other Ambulatory Visit: Payer: Self-pay

## 2017-10-21 ENCOUNTER — Ambulatory Visit
Admission: EM | Admit: 2017-10-21 | Discharge: 2017-10-21 | Disposition: A | Discharge status: Home / Self Care | Payer: BLUE CROSS/BLUE SHIELD | Attending: Family Medicine | Admitting: Family Medicine

## 2017-10-21 DIAGNOSIS — R21 Rash and other nonspecific skin eruption: Secondary | ICD-10-CM

## 2017-10-21 MED ORDER — PREDNISONE 10 MG (21) PO TBPK: ORAL_TABLET | Freq: Every day | ORAL | 0 refills | Status: DC

## 2017-10-21 MED ORDER — TRIAMCINOLONE ACETONIDE 0.1 % EX CREA: 1.0000 "application " | TOPICAL_CREAM | Freq: Two times a day (BID) | CUTANEOUS | 0 refills | Status: DC

## 2017-10-21 MED ORDER — HYDROXYZINE HCL 25 MG PO TABS: 25.0000 mg | ORAL_TABLET | Freq: Four times a day (QID) | ORAL | 0 refills | Status: DC

## 2017-10-21 NOTE — ED Provider Notes (Signed)
[facility]    CSN: [id number] Arrival date & time: 10/21/17  1637     History   Chief Complaint Chief Complaint  Patient presents with  . Rash    HPI Tristan [name] is a 37 y.o. male.   HPI  37 year old male presents with a rash most of his body for the last 4 to 6 weeks.  States it is very itchy.  Seems to be relieved with anything cold with such as showers or ice.  Has been using Benadryl and itch cream.  Typically he states that during the day it does not seem to be as bad but is worse after he returns home at night.  The animal he has a cat who has been he has for years and does not go out of doors.  The rash does not keep him awake at night.  Is not relate changing any perfumes deodorants cleansing materials etc.  He works at [employer].  States that several of the areas he has itched has turned into a scar.  It has spared his axilla and groin.  Is primarily on his trunk and extremities.  He has areas of excoriation present.         History reviewed. No pertinent past medical history.  There are no active problems to display for this patient.   Past Surgical History:  Procedure Laterality Date  . COLONOSCOPY WITH PROPOFOL N/A 08/16/2016   Procedure: COLONOSCOPY WITH PROPOFOL;  Surgeon: [name], MD;  Location: [facility] ENDOSCOPY;  Service: Endoscopy;  Laterality: N/A;       Home Medications    Prior to Admission medications   Medication Sig Start Date End Date Taking? Authorizing Provider  lactobacillus acidophilus (BACID) TABS tablet Take 2 tablets by mouth 3 (three) times daily.   Yes [provider]  hydrOXYzine (ATARAX/VISTARIL) 25 MG tablet Take 1 tablet (25 mg total) by mouth every 6 (six) hours. As necessary for itching 10/21/17   Lorin Picket, PA-C  predniSONE (STERAPRED UNI-PAK 21 TAB) 10 MG (21) TBPK tablet Take by mouth daily. Take 6 tabs by mouth daily  for 2 days, then 5 tabs for 2 days, then 4 tabs for 2 days, then  3 tabs for 2 days, 2 tabs for 2 days, then 1 tab by mouth daily for 2 days 10/21/17   Lorin Picket, PA-C  triamcinolone cream (KENALOG) 0.1 % Apply 1 application topically 2 (two) times daily. 10/21/17   Lorin Picket, PA-C    Family History Family History  Problem Relation Age of Onset  . Diabetes Mother     Social History Social History   Tobacco Use  . Smoking status: Current Every Day Smoker    Packs/day: 1.00  . Smokeless tobacco: Never Used  Substance Use Topics  . Alcohol use: Not Currently  . Drug use: No     Allergies   Patient has no known allergies.   Review of Systems Review of Systems  Constitutional: Negative for activity change, appetite change, chills, fatigue and fever.  Skin: Positive for rash.  All other systems reviewed and are negative.    Physical Exam Triage Vital Signs ED Triage Vitals  Enc Vitals Group     BP 10/21/17 1652 122/79     Pulse Rate 10/21/17 1652 92     Resp 10/21/17 1652 18     Temp 10/21/17 1652 98.3 F (36.8 C)     Temp Source 10/21/17 1652 Oral  SpO2 10/21/17 1652 99 %     Weight 10/21/17 1649 170 lb (77.1 kg)     Height 10/21/17 1649 6\' 1"  (1.854 m)     Head Circumference --      Peak Flow --      Pain Score 10/21/17 1649 0     Pain Loc --      Pain Edu? --      Excl. in [place]? --    No data found.  Updated Vital Signs BP 122/79 (BP Location: Left Arm)   Pulse 92   Temp 98.3 F (36.8 C) (Oral)   Resp 18   Ht 6\' 1"  (1.854 m)   Wt 170 lb (77.1 kg)   SpO2 99%   BMI 22.43 kg/m   Visual Acuity Right Eye Distance:   Left Eye Distance:   Bilateral Distance:    Right Eye Near:   Left Eye Near:    Bilateral Near:     Physical Exam  Constitutional: He is oriented to person, place, and time. He appears well-developed and well-nourished. No distress.  HENT:  Head: Normocephalic.  Eyes: Pupils are equal, round, and reactive to light. Right eye exhibits no discharge. Left eye exhibits no discharge.    Neck: Normal range of motion.  Musculoskeletal: Normal range of motion.  Neurological: He is alert and oriented to person, place, and time.  Skin: Skin is warm and dry. Rash noted. He is not diaphoretic.  Refer to photographs for details.  Macular papular rash on extremities trunk and face.  Nothing on the hands.  Small 1 to 3 mm in diameter some excoriated.  Some are pustular in the beginning there is no confluence noticed.  Psychiatric: He has a normal mood and affect. His behavior is normal. Judgment and thought content normal.  Nursing note and vitals reviewed.        UC Treatments / Results  Labs (all labs ordered are listed, but only abnormal results are displayed) Labs Reviewed - No data to display  EKG None  Radiology No results found.  Procedures Procedures (including critical care time)  Medications Ordered in UC Medications - No data to display  Initial Impression / Assessment and Plan / UC Course  I have reviewed the triage vital signs and the nursing notes.  Pertinent labs & imaging results that were available during my care of the patient were reviewed by me and considered in my medical decision making (see chart for details).    Plan: 1. Test/x-ray results and diagnosis reviewed with patient 2. rx as per orders; risks, benefits, potential side effects reviewed with patient 3. Recommend supportive treatment with prednisone 12-day taper.  [facility] supply with Atarax for itching and triamcinolone cream for spot use on particularly itchy areas.  If he is not improving will refer him to dermatology. 4. F/u prn if symptoms worsen or don't improve  Final Clinical Impressions(s) / UC Diagnoses   Final diagnoses:  Rash   Discharge Instructions   None    ED Prescriptions    Medication Sig Dispense Auth. Provider   predniSONE (STERAPRED UNI-PAK 21 TAB) 10 MG (21) TBPK tablet Take by mouth daily. Take 6 tabs by mouth daily  for 2 days, then 5 tabs for 2 days,  then 4 tabs for 2 days, then 3 tabs for 2 days, 2 tabs for 2 days, then 1 tab by mouth daily for 2 days 42 tablet Crecencio Mc P, PA-C   hydrOXYzine (ATARAX/VISTARIL) 25 MG tablet  Take 1 tablet (25 mg total) by mouth every 6 (six) hours. As necessary for itching 30 tablet Crecencio Mc P, PA-C   triamcinolone cream (KENALOG) 0.1 % Apply 1 application topically 2 (two) times daily. 30 g Lorin Picket, PA-C     Controlled Substance Prescriptions [place] Controlled Substance Registry consulted? Not Applicable   Lorin Picket, PA-C 10/21/17 1732

## 2017-10-21 NOTE — ED Triage Notes (Signed)
Patient complains of rash all over body that started 4-6 weeks ago. Patient states that he has been using benadryl and itch cream and cold showers.

## 2017-10-29 ENCOUNTER — Encounter: Payer: Self-pay | Admitting: Emergency Medicine

## 2017-10-29 ENCOUNTER — Ambulatory Visit
Admission: EM | Admit: 2017-10-29 | Discharge: 2017-10-29 | Disposition: A | Discharge status: Home / Self Care | Payer: Self-pay | Attending: Family Medicine | Admitting: Family Medicine

## 2017-10-29 ENCOUNTER — Other Ambulatory Visit: Payer: Self-pay

## 2017-10-29 DIAGNOSIS — R21 Rash and other nonspecific skin eruption: Secondary | ICD-10-CM

## 2017-10-29 MED ORDER — DOXYCYCLINE HYCLATE 100 MG PO CAPS: 100.0000 mg | ORAL_CAPSULE | Freq: Two times a day (BID) | ORAL | 0 refills | Status: DC

## 2017-10-29 NOTE — Discharge Instructions (Signed)
Try the doxy.  Continue the triamcinolone.  If persists, see Derm.  Take care  Dr. Lacinda Axon

## 2017-10-29 NOTE — ED Provider Notes (Signed)
[facility]    CSN: [id number] Arrival date & time: 10/29/17  1727  History   Chief Complaint Chief Complaint  Patient presents with  . Rash   HPI  37 year old male presents with persistent rash.  Patient seen on 8/12.  Placed on prednisone, triamcinolone, Atarax.  Patient states that his rash is improved but not resolved.  He continues to have several areas, most notably on the trunk and back.  Mild itching.  Patient is concerned as his rash is not completely resolved.  Patient states that he has had no new exposures except for eating macadamian nut cookies.  No known exacerbating factors.  No other associated symptoms.  No other complaints.  Social History Social History   Tobacco Use  . Smoking status: Current Every Day Smoker    Packs/day: 1.00  . Smokeless tobacco: Never Used  Substance Use Topics  . Alcohol use: Not Currently  . Drug use: No   Allergies   Patient has no known allergies.  Review of Systems Review of Systems  Constitutional: Negative.   Skin: Positive for rash.   Physical Exam Triage Vital Signs ED Triage Vitals  Enc Vitals Group     BP 10/29/17 1739 (!) 143/80     Pulse Rate 10/29/17 1739 99     Resp 10/29/17 1739 16     Temp 10/29/17 1739 98.4 F (36.9 C)     Temp Source 10/29/17 1739 Oral     SpO2 10/29/17 1739 99 %     Weight 10/29/17 1739 169 lb 12.1 oz (77 kg)     Height 10/29/17 1739 6\' 1"  (1.854 m)     Head Circumference --      Peak Flow --      Pain Score 10/29/17 1738 0     Pain Loc --      Pain Edu? --      Excl. in [place]? --    Updated Vital Signs BP (!) 143/80 (BP Location: Left Arm)   Pulse 99   Temp 98.4 F (36.9 C) (Oral)   Resp 16   Ht 6\' 1"  (1.854 m)   Wt 77 kg   SpO2 99%   BMI 22.40 kg/m   Visual Acuity Right Eye Distance:   Left Eye Distance:   Bilateral Distance:    Right Eye Near:   Left Eye Near:    Bilateral Near:     Physical Exam  Constitutional: He is oriented to person, place, and  time. He appears well-developed. No distress.  HENT:  Head: Normocephalic and atraumatic.  Pulmonary/Chest: Effort normal. No respiratory distress.  Neurological: He is alert and oriented to person, place, and time.  Skin:  Patient with scattered erythematous papules.   Psychiatric: He has a normal mood and affect. His behavior is normal.  Nursing note and vitals reviewed.  UC Treatments / Results  Labs (all labs ordered are listed, but only abnormal results are displayed) Labs Reviewed - No data to display  EKG None  Radiology No results found.  Procedures Procedures (including critical care time)  Medications Ordered in UC Medications - No data to display  Initial Impression / Assessment and Plan / UC Course  I have reviewed the triage vital signs and the nursing notes.  Pertinent labs & imaging results that were available during my care of the patient were reviewed by me and considered in my medical decision making (see chart for details).    37 year old male presents  with persistent rash. Uncertain etiology. Trial of doxy given the fact that some of areas appear similar to pustules. If persists, advised to see Derm.  Final Clinical Impressions(s) / UC Diagnoses   Final diagnoses:  Rash     Discharge Instructions     Try the doxy.  Continue the triamcinolone.  If persists, see Derm.  Take care  Dr. Lacinda Axon    ED Prescriptions    Medication Sig Dispense Auth. Provider   doxycycline (VIBRAMYCIN) 100 MG capsule Take 1 capsule (100 mg total) by mouth 2 (two) times daily. 14 capsule Coral Spikes, DO     Controlled Substance Prescriptions [place] Controlled Substance Registry consulted? Not Applicable   Coral Spikes, DO 10/29/17 1824

## 2017-10-29 NOTE — ED Triage Notes (Signed)
Patient in today c/o rash. Patient was seen 10/21/17 and treated with steroids, triamcinolone cream. Patient states rash is better, but not resolved.

## 2017-12-01 ENCOUNTER — Other Ambulatory Visit: Payer: Self-pay

## 2017-12-01 ENCOUNTER — Encounter: Payer: Self-pay | Admitting: Emergency Medicine

## 2017-12-01 ENCOUNTER — Ambulatory Visit
Admission: EM | Admit: 2017-12-01 | Discharge: 2017-12-01 | Disposition: A | Discharge status: Home / Self Care | Payer: Self-pay | Attending: Emergency Medicine | Admitting: Emergency Medicine

## 2017-12-01 DIAGNOSIS — J0100 Acute maxillary sinusitis, unspecified: Secondary | ICD-10-CM

## 2017-12-01 DIAGNOSIS — J209 Acute bronchitis, unspecified: Secondary | ICD-10-CM

## 2017-12-01 DIAGNOSIS — F17210 Nicotine dependence, cigarettes, uncomplicated: Secondary | ICD-10-CM

## 2017-12-01 MED ORDER — BENZONATATE 100 MG PO CAPS: 100.0000 mg | ORAL_CAPSULE | Freq: Three times a day (TID) | ORAL | 0 refills | Status: DC | PRN

## 2017-12-01 MED ORDER — DOXYCYCLINE HYCLATE 100 MG PO CAPS: 100.0000 mg | ORAL_CAPSULE | Freq: Two times a day (BID) | ORAL | 0 refills | Status: DC

## 2017-12-01 MED ORDER — PREDNISONE 20 MG PO TABS: 40.0000 mg | ORAL_TABLET | Freq: Every day | ORAL | 0 refills | Status: DC

## 2017-12-01 MED ORDER — HYDROCOD POLST-CPM POLST ER 10-8 MG/5ML PO SUER: 5.0000 mL | Freq: Every evening | ORAL | 0 refills | Status: DC | PRN

## 2017-12-01 MED ORDER — ALBUTEROL SULFATE HFA 108 (90 BASE) MCG/ACT IN AERS: 2.0000 | INHALATION_SPRAY | RESPIRATORY_TRACT | 0 refills | Status: DC | PRN

## 2017-12-01 NOTE — Discharge Instructions (Signed)
Take medication as prescribed and as discussed. Rest. Drink plenty of fluids.   Follow up with your primary care physician this week as needed. Return to Urgent care for new or worsening concerns.

## 2017-12-01 NOTE — ED Provider Notes (Signed)
[facility] ____________________________________________  Time seen: Approximately 10:03 AM  I have reviewed the triage vital signs and the nursing notes.   HISTORY  Chief Complaint Cough and Sinus Problem   HPI Tristan [name] is a 37 y.o. male presenting for evaluation of 6 days of runny nose, nasal congestion, cough and chest congestion.  States initially had a sore throat but is since resolved.  States intermittent sinus pressure with his congestion.  States cough is productive of green sputum.  States occasionally hears himself wheeze.  Is a chronic smoker.  Denies shortness of breath or chest pain.  Denies known fevers.  Denies known direct sick contacts.  Has been taken over-the-counter DayQuil and NyQuil which helps some but no resolution.  Continues to drink fluids well, overall eating and drinking.  Denies other aggravating or alleviating factors.  Reports otherwise feels well denies other complaints.Denies recent sickness. Denies recent antibiotic use.    History reviewed. No pertinent past medical history. Denies   There are no active problems to display for this patient.   Past Surgical History:  Procedure Laterality Date  . COLONOSCOPY WITH PROPOFOL N/A 08/16/2016   Procedure: COLONOSCOPY WITH PROPOFOL;  Surgeon: [name], MD;  Location: [facility] ENDOSCOPY;  Service: Endoscopy;  Laterality: N/A;     No current facility-administered medications for this encounter.   Current Outpatient Medications:  .  albuterol (PROVENTIL HFA;VENTOLIN HFA) 108 (90 Base) MCG/ACT inhaler, Inhale 2 puffs into the lungs every 4 (four) hours as needed for wheezing., Disp: 1 Inhaler, Rfl: 0 .  benzonatate (TESSALON PERLES) 100 MG capsule, Take 1 capsule (100 mg total) by mouth 3 (three) times daily as needed for cough., Disp: 15 capsule, Rfl: 0 .  chlorpheniramine-HYDROcodone (TUSSIONEX PENNKINETIC ER) 10-8 MG/5ML SUER, Take 5 mLs by mouth at bedtime as needed for cough. do not  drive or operate machinery while taking as can cause drowsiness., Disp: 50 mL, Rfl: 0 .  doxycycline (VIBRAMYCIN) 100 MG capsule, Take 1 capsule (100 mg total) by mouth 2 (two) times daily., Disp: 20 capsule, Rfl: 0 .  predniSONE (DELTASONE) 20 MG tablet, Take 2 tablets (40 mg total) by mouth daily., Disp: 10 tablet, Rfl: 0  Allergies Patient has no known allergies.  Family History  Problem Relation Age of Onset  . Diabetes Mother   . Healthy Father     Social History Social History   Tobacco Use  . Smoking status: Current Every Day Smoker    Packs/day: 1.00  . Smokeless tobacco: Never Used  Substance Use Topics  . Alcohol use: Not Currently  . Drug use: No    Review of Systems Constitutional: No fever/chills Cardiovascular: Denies chest pain. Respiratory: Denies shortness of breath. Gastrointestinal: No abdominal pain.  Musculoskeletal: Negative for back pain. Skin: Negative for rash.   ____________________________________________   PHYSICAL EXAM:  VITAL SIGNS: ED Triage Vitals  Enc Vitals Group     BP 12/01/17 0810 120/76     Pulse Rate 12/01/17 0810 95     Resp 12/01/17 0810 16     Temp 12/01/17 0810 98.6 F (37 C)     Temp Source 12/01/17 0810 Oral     SpO2 12/01/17 0810 98 %     Weight 12/01/17 0808 175 lb (79.4 kg)     Height 12/01/17 0808 6\' 1"  (1.854 m)     Head Circumference --      Peak Flow --      Pain Score 12/01/17 0808 6  Pain Loc --      Pain Edu? --      Excl. in [place]? --    Constitutional: Alert and oriented. Well appearing and in no acute distress. Eyes: Conjunctivae are normal.  Head: [email or web address] maxillary sinus tenderness palpation.  No frontal sinus tenderness palpation.  No swelling. No erythema.  Ears: no erythema, normal TMs bilaterally.   Nose:Nasal congestion with clear rhinorrhea  Mouth/Throat: Mucous membranes are moist. No pharyngeal erythema. No tonsillar swelling or exudate.  Neck: No stridor.  No cervical spine  tenderness to palpation. Hematological/Lymphatic/Immunilogical: No cervical lymphadenopathy. Cardiovascular: Normal rate, regular rhythm. Grossly normal heart sounds.  Good peripheral circulation. Respiratory: Normal respiratory effort.  No retractions.  Mild scattered inspiratory and expiratory wheezes.  Mild scattered rhonchi. Good air movement.  Speaks in complete sentences. Musculoskeletal: Ambulatory with steady gait.  Neurologic:  Normal speech and language. No gait instability. Skin:  Skin appears warm, dry and intact. No rash noted. Psychiatric: Mood and affect are normal. Speech and behavior are normal.  ___________________________________________   LABS (all labs ordered are listed, but only abnormal results are displayed)  Labs Reviewed - No data to display   PROCEDURES Procedures   INITIAL IMPRESSION / ASSESSMENT AND PLAN / ED COURSE  Pertinent labs & imaging results that were available during my care of the patient were reviewed by me and considered in my medical decision making (see chart for details).  Well-appearing patient.  No acute distress.  Suspect recent viral upper respiratory infection.  Patient is a chronic smoker, suspect acute bronchitis and sinusitis.  Will treat with prednisone, albuterol inhaler, PRN Tessalon Perles and PRN Tussionex.  Over-the-counter Sudafed use.  Encourage this for 2-3 more days and if symptoms not improving then to start Rx hardcopy doxycycline.  Discussed prompt reevaluation for any worsening concerns.  Encourage rest, fluids, supportive care.Discussed indication, risks and benefits of medications with patient.  Discussed follow up with Primary care physician this week. Discussed follow up and return parameters including no resolution or any worsening concerns. Patient verbalized understanding and agreed to plan.   ____________________________________________   FINAL CLINICAL IMPRESSION(S) / ED DIAGNOSES  Final diagnoses:  Acute  bronchitis, unspecified organism  Acute maxillary sinusitis, recurrence not specified     ED Discharge Orders         Ordered    doxycycline (VIBRAMYCIN) 100 MG capsule  2 times daily     12/01/17 0835    predniSONE (DELTASONE) 20 MG tablet  Daily     12/01/17 0835    albuterol (PROVENTIL HFA;VENTOLIN HFA) 108 (90 Base) MCG/ACT inhaler  Every 4 hours PRN     12/01/17 0835    benzonatate (TESSALON PERLES) 100 MG capsule  3 times daily PRN     12/01/17 0835    chlorpheniramine-HYDROcodone (TUSSIONEX PENNKINETIC ER) 10-8 MG/5ML SUER  At bedtime PRN     12/01/17 9357           Note: This dictation was prepared with Dragon dictation along with smaller phrase technology. Any transcriptional errors that result from this process are unintentional.         Marylene Land, NP 12/01/17 1008

## 2017-12-01 NOTE — ED Triage Notes (Signed)
Patient c/o cough, chest congestion, sinus pressure and runny nose since Monday.

## 2018-10-30 ENCOUNTER — Encounter: Payer: Self-pay | Admitting: Physician Assistant

## 2018-10-30 ENCOUNTER — Other Ambulatory Visit: Payer: Self-pay

## 2018-10-30 ENCOUNTER — Ambulatory Visit: Payer: Self-pay | Admitting: Physician Assistant

## 2018-10-30 DIAGNOSIS — A630 Anogenital (venereal) warts: Secondary | ICD-10-CM

## 2018-10-30 NOTE — Progress Notes (Signed)
Here today for HPV Tx. Hal Morales, RN

## 2018-10-30 NOTE — Progress Notes (Signed)
S:  Patient into clinic requesting HPV cryotx today. Declines other screening and bloodwork.  Denies problems with previous tx with cryo. 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  HPV- patient requests cryotx 2.  Cryotx to HPV in 3 freeze/thaw cycles 3.  Reviewed with patient after care instructions 4.  No sex until after area has healed 5.  Rec condoms with all sex 6.  RTC 10-14 days if needed for next treatment 7.  Call with any questions or concerns.

## 2018-10-31 ENCOUNTER — Encounter: Payer: Self-pay | Admitting: Emergency Medicine

## 2018-10-31 ENCOUNTER — Ambulatory Visit
Admission: EM | Admit: 2018-10-31 | Discharge: 2018-10-31 | Disposition: A | Discharge status: Home / Self Care | Payer: Self-pay | Attending: Family Medicine | Admitting: Family Medicine

## 2018-10-31 ENCOUNTER — Other Ambulatory Visit: Payer: Self-pay

## 2018-10-31 DIAGNOSIS — R1031 Right lower quadrant pain: Secondary | ICD-10-CM

## 2018-10-31 DIAGNOSIS — R1013 Epigastric pain: Secondary | ICD-10-CM

## 2018-10-31 DIAGNOSIS — R198 Other specified symptoms and signs involving the digestive system and abdomen: Secondary | ICD-10-CM

## 2018-10-31 NOTE — ED Provider Notes (Signed)
[facility]    CSN: [id number] Arrival date & time: 10/31/18  1627      History   Chief Complaint Chief Complaint  Patient presents with  . Abdominal Pain    right sided    HPI Tristan [name] is a 38 y.o. male.   38 yo male with a c/o progressively worsening right lower abdominal pain for the past 3 weeks. Denies any injury, fevers, chills, vomiting, constipation, diarrhea, dysuria, hematuria, penile discharge.  States today he's had some slight sweats.    Abdominal Pain   History reviewed. No pertinent past medical history.  Patient Active Problem List   Diagnosis Date Noted  . Genital warts 07/15/2015    Past Surgical History:  Procedure Laterality Date  . COLONOSCOPY WITH PROPOFOL N/A 08/16/2016   Procedure: COLONOSCOPY WITH PROPOFOL;  Surgeon: [name], MD;  Location: [facility] ENDOSCOPY;  Service: Endoscopy;  Laterality: N/A;       Home Medications    Prior to Admission medications   Medication Sig Start Date End Date Taking? Authorizing Provider  albuterol (PROVENTIL HFA;VENTOLIN HFA) 108 (90 Base) MCG/ACT inhaler Inhale 2 puffs into the lungs every 4 (four) hours as needed for wheezing. 12/01/17   Marylene Land, NP    Family History Family History  Problem Relation Age of Onset  . Diabetes Mother   . Healthy Father     Social History Social History   Tobacco Use  . Smoking status: Current Every Day Smoker    Packs/day: 1.00  . Smokeless tobacco: Never Used  Substance Use Topics  . Alcohol use: Not Currently  . Drug use: No     Allergies   Patient has no known allergies.   Review of Systems Review of Systems  Gastrointestinal: Positive for abdominal pain.     Physical Exam Triage Vital Signs ED Triage Vitals  Enc Vitals Group     BP 10/31/18 1641 137/80     Pulse Rate 10/31/18 1641 94     Resp 10/31/18 1641 16     Temp 10/31/18 1641 99 F (37.2 C)     Temp Source 10/31/18 1641 Oral     SpO2 10/31/18 1641 99 %    Weight 10/31/18 1638 190 lb (86.2 kg)     Height 10/31/18 1638 6\' 1"  (1.854 m)     Head Circumference --      Peak Flow --      Pain Score 10/31/18 1719 1     Pain Loc --      Pain Edu? --      Excl. in [place]? --    No data found.  Updated Vital Signs BP 137/80 (BP Location: Left Arm)   Pulse 94   Temp 99 F (37.2 C) (Oral)   Resp 16   Ht 6\' 1"  (1.854 m)   Wt 86.2 kg   SpO2 99%   BMI 25.07 kg/m   Visual Acuity Right Eye Distance:   Left Eye Distance:   Bilateral Distance:    Right Eye Near:   Left Eye Near:    Bilateral Near:     Physical Exam Vitals signs and nursing note reviewed.  Constitutional:      General: He is not in acute distress.    Appearance: He is not toxic-appearing or diaphoretic.  Abdominal:     General: Abdomen is flat. Bowel sounds are normal. There is no distension. There are no signs of injury.     Palpations: Abdomen is  soft.     Tenderness: There is abdominal tenderness in the right lower quadrant. There is no right CVA tenderness or left CVA tenderness. Positive signs include McBurney's sign.     Hernia: No hernia is present.  Neurological:     Mental Status: He is alert.      UC Treatments / Results  Labs (all labs ordered are listed, but only abnormal results are displayed) Labs Reviewed - No data to display  EKG   Radiology No results found.  Procedures Procedures (including critical care time)  Medications Ordered in UC Medications - No data to display  Initial Impression / Assessment and Plan / UC Course  I have reviewed the triage vital signs and the nursing notes.  Pertinent labs & imaging results that were available during my care of the patient were reviewed by me and considered in my medical decision making (see chart for details).      Final Clinical Impressions(s) / UC Diagnoses   Final diagnoses:  Right lower quadrant abdominal pain  Tenderness at McBurney's point     Discharge Instructions      Recommend patient go to Emergency Department for further evaluation and management    ED Prescriptions    None     1. differential diagnosis reviewed with patient; recommend patient go to Emergency Department for further evaluation; patient verbalizes understanding, is in stable condition and will proceed by private vehicle.    Controlled Substance Prescriptions [place] Controlled Substance Registry consulted? Not Applicable   Norval Gable, MD 10/31/18 [phone]

## 2018-10-31 NOTE — ED Triage Notes (Signed)
Patient c/o sharp right sided abdominal pain off and on for 3-4 weeks.  Patient reports pain is worse when he bends or moves a certain.  Patient denies N/V/D.  Patient denies fevers.

## 2018-10-31 NOTE — Discharge Instructions (Signed)
Recommend patient go to Emergency Department for further evaluation and management °

## 2018-11-01 ENCOUNTER — Encounter: Payer: Self-pay | Admitting: Emergency Medicine

## 2018-11-01 ENCOUNTER — Emergency Department
Admission: EM | Admit: 2018-11-01 | Discharge: 2018-11-01 | Disposition: A | Discharge status: Home / Self Care | Payer: Self-pay | Attending: Emergency Medicine | Admitting: Emergency Medicine

## 2018-11-01 ENCOUNTER — Emergency Department: Payer: Self-pay

## 2018-11-01 ENCOUNTER — Other Ambulatory Visit: Payer: Self-pay

## 2018-11-01 DIAGNOSIS — F17210 Nicotine dependence, cigarettes, uncomplicated: Secondary | ICD-10-CM | POA: Insufficient documentation

## 2018-11-01 DIAGNOSIS — R9389 Abnormal findings on diagnostic imaging of other specified body structures: Secondary | ICD-10-CM

## 2018-11-01 DIAGNOSIS — K529 Noninfective gastroenteritis and colitis, unspecified: Secondary | ICD-10-CM | POA: Insufficient documentation

## 2018-11-01 DIAGNOSIS — R935 Abnormal findings on diagnostic imaging of other abdominal regions, including retroperitoneum: Secondary | ICD-10-CM | POA: Insufficient documentation

## 2018-11-01 LAB — COMPREHENSIVE METABOLIC PANEL
ALT: 13 U/L (ref 0–44)
AST: 17 U/L (ref 15–41)
Albumin: 3.9 g/dL (ref 3.5–5.0)
Alkaline Phosphatase: 81 U/L (ref 38–126)
Anion gap: 9 (ref 5–15)
BUN: 7 mg/dL (ref 6–20)
CO2: 29 mmol/L (ref 22–32)
Calcium: 9.6 mg/dL (ref 8.9–10.3)
Chloride: 102 mmol/L (ref 98–111)
Creatinine, Ser: 0.81 mg/dL (ref 0.61–1.24)
GFR calc Af Amer: >60 mL/min (ref >60)
GFR calc non Af Amer: >60 mL/min (ref >60)
Glucose, Bld: 91 mg/dL (ref 70–99)
Potassium: 4 mmol/L (ref 3.5–5.1)
Sodium: 140 mmol/L (ref 135–145)
Total Bilirubin: 0.6 mg/dL (ref 0.3–1.2)
Total Protein: 7.5 g/dL (ref 6.5–8.1)

## 2018-11-01 LAB — CBC
HCT: 40.9 % (ref 39.0–52.0)
Hemoglobin: 12.7 g/dL — ABNORMAL LOW (ref 13.0–17.0)
MCH: 25.8 pg — ABNORMAL LOW (ref 26.0–34.0)
MCHC: 31.1 g/dL (ref 30.0–36.0)
MCV: 83.1 fL (ref 80.0–100.0)
Platelets: 249 10*3/uL (ref 150–400)
RBC: 4.92 MIL/uL (ref 4.22–5.81)
RDW: 14.5 % (ref 11.5–15.5)
WBC: 7.9 10*3/uL (ref 4.0–10.5)
nRBC: 0 % (ref 0.0–0.2)

## 2018-11-01 LAB — URINALYSIS, COMPLETE (UACMP) WITH MICROSCOPIC
Bacteria, UA: NONE SEEN
Bilirubin Urine: NEGATIVE
Glucose, UA: NEGATIVE mg/dL
Hgb urine dipstick: NEGATIVE
Ketones, ur: NEGATIVE mg/dL
Leukocytes,Ua: NEGATIVE
Nitrite: NEGATIVE
Protein, ur: NEGATIVE mg/dL
Specific Gravity, Urine: 1.009 (ref 1.005–1.030)
WBC, UA: NONE SEEN WBC/hpf (ref 0–5)
pH: 7 (ref 5.0–8.0)

## 2018-11-01 LAB — LIPASE, BLOOD: Lipase: 22 U/L (ref 11–51)

## 2018-11-01 MED ORDER — METRONIDAZOLE 500 MG PO TABS: 500.0000 mg | ORAL_TABLET | Freq: Three times a day (TID) | ORAL | 0 refills | Status: AC

## 2018-11-01 MED ORDER — CIPROFLOXACIN HCL 500 MG PO TABS: 500.0000 mg | ORAL_TABLET | Freq: Two times a day (BID) | ORAL | 0 refills | Status: AC

## 2018-11-01 MED ORDER — HYDROCODONE-ACETAMINOPHEN 5-325 MG PO TABS: 1.0000 | ORAL_TABLET | ORAL | 0 refills | Status: DC | PRN

## 2018-11-01 MED ORDER — SODIUM CHLORIDE 0.9% FLUSH: 3.0000 mL | Freq: Once | INTRAVENOUS | Status: DC

## 2018-11-01 MED ORDER — IOHEXOL 300 MG/ML  SOLN
100.0000 mL | Freq: Once | INTRAMUSCULAR | Status: AC | PRN
  Administered 2018-11-01: 100 mL via INTRAVENOUS

## 2018-11-01 NOTE — ED Triage Notes (Signed)
Pt arrived via POV with reports of RLQ abdominal pain, pt states the pain is worse with moving, and bending. Pt states the pain as been going on x 3 weeks. Denies N/V/D.   Pt states standing around does not have cause pain, but increases with movement and is tender upon palpation.

## 2018-11-01 NOTE — ED Notes (Signed)
Dr Isaacs at bedside 

## 2018-11-01 NOTE — ED Notes (Signed)
Pt having RLQ abdomenal pain- nothing makes it better or worse- suspected that its the appendix- no gallbladder or appendix surgery- denies n/v/d

## 2018-11-01 NOTE — ED Notes (Signed)
FIRST NURSE NOTE:  PT seen yesterday at [facility], was referred to ED yesterday to r/o appendicitis. Pt in today continues to have abdominal pain. Denies any N/V at this time.  NAD noted on arrival. Pt is alert and oriented.

## 2018-11-01 NOTE — ED Notes (Signed)
Pt given remote for TV

## 2018-11-01 NOTE — Discharge Instructions (Signed)
As we discussed, your CT scan is concerning for recurrent infection of the colon. While the antiiotics may help this, it is VERY IMPORTANT to follow-up with GI to determine if there is a more serious underlying condition, such as Crohn's disease, ulcerative colitis, or cancer.

## 2018-11-01 NOTE — ED Notes (Signed)
Patient transported to CT 

## 2018-11-01 NOTE — ED Provider Notes (Signed)
[facility] Emergency Department Provider Note  ____________________________________________   First MD Initiated Contact with Patient 11/01/18 1049     (approximate)  I have reviewed the triage vital signs and the nursing notes.   HISTORY  Chief Complaint Abdominal Pain    HPI Tristan [name] is a 38 y.o. male  With PMHx below here with RLQ abd pain. Pt reports that for the past 3 weeks he's had progressively worsening RLQ pain. He states it began as a mild, aching, gnawing RLQ pain that has not worsened but has been persistent. It is worse with movement, palpation, and stretching so he thought it was MSK, but it has not improved. He went to UC yesterday at his family's request and was told to come to the ED. However, he had to go back home and is now back for evaluation. Reports aching, gnawing pain RLQ. No nausea, vomiting. No diarrhea or blood in stool. NO fevers. NO other complaints. No weight loss, night sweats. No recent travel.       History reviewed. No pertinent past medical history.  Patient Active Problem List   Diagnosis Date Noted   Genital warts 07/15/2015    Past Surgical History:  Procedure Laterality Date   COLONOSCOPY WITH PROPOFOL N/A 08/16/2016   Procedure: COLONOSCOPY WITH PROPOFOL;  Surgeon: [name], MD;  Location: [facility] ENDOSCOPY;  Service: Endoscopy;  Laterality: N/A;    Prior to Admission medications   Medication Sig Start Date End Date Taking? Authorizing Provider  albuterol (PROVENTIL HFA;VENTOLIN HFA) 108 (90 Base) MCG/ACT inhaler Inhale 2 puffs into the lungs every 4 (four) hours as needed for wheezing. 12/01/17   Marylene Land, NP  ciprofloxacin (CIPRO) 500 MG tablet Take 1 tablet (500 mg total) by mouth 2 (two) times daily for 10 days. 11/01/18 11/11/18  Duffy Bruce, MD  famotidine (PEPCID) 40 MG tablet Take 40 mg by mouth 2 (two) times daily. 09/16/18   [provider]  levocetirizine (XYZAL) 5 MG tablet  Take 5 mg by mouth 2 (two) times daily. 09/16/18   [provider]  metroNIDAZOLE (FLAGYL) 500 MG tablet Take 1 tablet (500 mg total) by mouth 3 (three) times daily for 10 days. 11/01/18 11/11/18  Duffy Bruce, MD  montelukast (SINGULAIR) 10 MG tablet Take 10 mg by mouth daily. 09/16/18   [provider]    Allergies Patient has no known allergies.  Family History  Problem Relation Age of Onset   Diabetes Mother    Healthy Father     Social History Social History   Tobacco Use   Smoking status: Current Every Day Smoker    Packs/day: 1.00   Smokeless tobacco: Never Used  Substance Use Topics   Alcohol use: Not Currently   Drug use: No    Review of Systems  Review of Systems  Constitutional: Negative for chills, fatigue and fever.  HENT: Negative for sore throat.   Respiratory: Negative for shortness of breath.   Cardiovascular: Negative for chest pain.  Gastrointestinal: Positive for abdominal pain and nausea.  Genitourinary: Negative for flank pain.  Musculoskeletal: Negative for neck pain.  Skin: Negative for rash and wound.  Allergic/Immunologic: Negative for immunocompromised state.  Neurological: Negative for weakness and numbness.  Hematological: Does not bruise/bleed easily.  All other systems reviewed and are negative.    ____________________________________________  PHYSICAL EXAM:      VITAL SIGNS: ED Triage Vitals  Enc Vitals Group     BP 11/01/18 1029 133/75  Pulse Rate 11/01/18 1029 97     Resp 11/01/18 1029 18     Temp 11/01/18 1029 98.8 F (37.1 C)     Temp Source 11/01/18 1029 Oral     SpO2 11/01/18 1029 100 %     Weight 11/01/18 1018 190 lb (86.2 kg)     Height 11/01/18 1018 6\' 1"  (1.854 m)     Head Circumference --      Peak Flow --      Pain Score 11/01/18 1017 7     Pain Loc --      Pain Edu? --      Excl. in [place]? --      Physical Exam Vitals signs and nursing note reviewed.  Constitutional:      General: He  is not in acute distress.    Appearance: He is well-developed.  HENT:     Head: Normocephalic and atraumatic.  Eyes:     Conjunctiva/sclera: Conjunctivae normal.  Neck:     Musculoskeletal: Neck supple.  Cardiovascular:     Rate and Rhythm: Normal rate and regular rhythm.     Heart sounds: Normal heart sounds. No murmur. No friction rub.  Pulmonary:     Effort: Pulmonary effort is normal. No respiratory distress.     Breath sounds: Normal breath sounds. No wheezing or rales.  Abdominal:     General: There is no distension.     Palpations: Abdomen is soft.     Tenderness: There is abdominal tenderness in the right lower quadrant and periumbilical area. Positive signs include Murphy's sign.  Skin:    General: Skin is warm.     Capillary Refill: Capillary refill takes less than 2 seconds.  Neurological:     Mental Status: He is alert and oriented to person, place, and time.     Motor: No abnormal muscle tone.       ____________________________________________   LABS (all labs ordered are listed, but only abnormal results are displayed)  Labs Reviewed  CBC - Abnormal; Notable for the following components:      Result Value   Hemoglobin 12.7 (*)    MCH 25.8 (*)    All other components within normal limits  URINALYSIS, COMPLETE (UACMP) WITH MICROSCOPIC - Abnormal; Notable for the following components:   Color, Urine YELLOW (*)    APPearance CLEAR (*)    All other components within normal limits  LIPASE, BLOOD  COMPREHENSIVE METABOLIC PANEL    ____________________________________________  EKG:  ________________________________________  RADIOLOGY All imaging, including plain films, CT scans, and ultrasounds, independently reviewed by me, and interpretations confirmed via formal radiology reads.  ED MD interpretation:   CT: Enterocolitis vs massl ike thickening of ascending colon and ileum, appendix likely reactive  Official radiology report(s): Ct Abdomen Pelvis W  Contrast  Result Date: 11/01/2018 CLINICAL DATA:  Right lower quadrant pain x3 weeks EXAM: CT ABDOMEN AND PELVIS WITH CONTRAST TECHNIQUE: Multidetector CT imaging of the abdomen and pelvis was performed using the standard protocol following bolus administration of intravenous contrast. CONTRAST:  127mL OMNIPAQUE IOHEXOL 300 MG/ML  SOLN COMPARISON:  None. FINDINGS: Lower chest: Lung bases are clear. Hepatobiliary: Liver is within normal limits. Gallbladder is unremarkable. No intrahepatic or extrahepatic ductal dilatation. Pancreas: Within normal limits. Spleen: Within normal limits. Adrenals/Urinary Tract: Adrenal glands are within normal limits. Kidneys are within normal limits.  No hydronephrosis. Bladder is within normal limits. Stomach/Bowel: Stomach is within normal limits. No evidence of bowel obstruction. Appendix is mildly  prominent with surrounding mild inflammatory changes (series 2/image 56), although this appearance is favored to be secondary. Masslike wall thickening involving the cecum/ascending colon and terminal ileum (coronal image 34). Surrounding inflammatory changes. While this appearance may simply reflect infectious/inflammatory enterocolitis, given the degree of wall thickening, colonic neoplasm is important to exclude. Vascular/Lymphatic: No evidence of abdominal aortic aneurysm. Small ileocolic nodes measuring up to 8 mm short axis (series 2/images 40 and 47). Reproductive: Prostate is unremarkable. Other: Trace pelvic fluid. Musculoskeletal: Mild degenerative changes of the lumbar spine. IMPRESSION: Masslike wall thickening involving the cecum/ascending colon and terminal ileum, with surrounding inflammatory changes. While this appearance may simply reflect infectious/inflammatory enterocolitis, given the degree of wall thickening, colonic neoplasm is important to exclude. Electronically Signed   By: Julian Hy M.D.   On: 11/01/2018 12:47     ____________________________________________  PROCEDURES   Procedure(s) performed (including Critical Care):  Procedures  ____________________________________________  INITIAL IMPRESSION / MDM / [place] / ED COURSE  As part of my medical decision making, I reviewed the following data within the electronic MEDICAL RECORD NUMBER Notes from prior ED visits and [place] Controlled Substance Database      *AHSAN SALOM was evaluated in Emergency Department on 11/01/2018 for the symptoms described in the history of present illness. He was evaluated in the context of the global COVID-19 pandemic, which necessitated consideration that the patient might be at risk for infection with the SARS-CoV-2 virus that causes COVID-19. Institutional protocols and algorithms that pertain to the evaluation of patients at risk for COVID-19 are in a state of rapid change based on information released by regulatory bodies including the CDC and federal and state organizations. These policies and algorithms were followed during the patient's care in the ED.  Some ED evaluations and interventions may be delayed as a result of limited staffing during the pandemic.*   Clinical Course as of Oct 31 1399  Sat Nov 01, 2018  1358 38 yo M here with ongoing RLQ pain x 3 weeks. CT scan shows focal entercolitis in RLQ, with secondary changes but no primary signs of appendicitis. WBC normal. CMP unremarkable. Per records, pt has had similar sx multiple times in same location in past. In addition to the acute colitis, there is concern for underlying IBD, also possible malignancy. Discussed case with Dr. Allen Norris, of GI. He recommends empiric ABX, f/u in outpt for further evaluation. Pt is in agreement with this plan. Absence of any fever, leukocytosis, and no CT evidence of primary appendicitis makes surgical etiology unlikely, and pt would much prefer ABX regardless.    [CI]    Clinical Course User Index [CI] Duffy Bruce, MD    Medical Decision Making: As above. Pt has responded well ti Cipro/Flagyl in past, will repeat.  ____________________________________________  FINAL CLINICAL IMPRESSION(S) / ED DIAGNOSES  Final diagnoses:  Enterocolitis  Abnormal CT scan     MEDICATIONS GIVEN DURING THIS VISIT:  Medications  sodium chloride flush (NS) 0.9 % injection 3 mL (3 mLs Intravenous Not Given 11/01/18 1052)  iohexol (OMNIPAQUE) 300 MG/ML solution 100 mL (100 mLs Intravenous Contrast Given 11/01/18 1156)     ED Discharge Orders         Ordered    ciprofloxacin (CIPRO) 500 MG tablet  2 times daily     11/01/18 1348    metroNIDAZOLE (FLAGYL) 500 MG tablet  3 times daily     11/01/18 1348  Note:  This document was prepared using Dragon voice recognition software and may include unintentional dictation errors.   Duffy Bruce, MD 11/01/18 1401

## 2018-11-03 ENCOUNTER — Telehealth: Payer: Self-pay | Admitting: Gastroenterology

## 2018-11-03 NOTE — Telephone Encounter (Signed)
I received records from the ED stating patient needed a f/u with Dr Vicente Males in 1wk. I called & l/m for patient to call back to schedule an appointment.

## 2018-11-12 ENCOUNTER — Ambulatory Visit: Payer: Self-pay | Admitting: Gastroenterology

## 2018-11-12 ENCOUNTER — Encounter: Payer: Self-pay | Admitting: Gastroenterology

## 2018-11-13 ENCOUNTER — Ambulatory Visit: Payer: Self-pay | Admitting: Gastroenterology

## 2018-11-13 ENCOUNTER — Other Ambulatory Visit: Payer: Self-pay

## 2018-11-13 VITALS — BP 116/77 | HR 101 | Temp 98.7°F | Ht 73.0 in | Wt 194.4 lb

## 2018-11-13 DIAGNOSIS — R933 Abnormal findings on diagnostic imaging of other parts of digestive tract: Secondary | ICD-10-CM

## 2018-11-13 DIAGNOSIS — R109 Unspecified abdominal pain: Secondary | ICD-10-CM

## 2018-11-13 NOTE — Progress Notes (Signed)
Tristan Bellows MD, MRCP(U.K) [address]  [place]  [place], [place] [address]  Main: [phone]  Fax: [phone]   Gastroenterology Consultation  Referring Provider: Emergency room  primary Care Physician:  Patient, No Pcp Per Primary Gastroenterologist:  Dr. Jonathon [name]  Reason for Consultation:    Abnormal CT scan of the abdomen  HPI:   Tristan [name] is a 38 y.o. y/o male referred from the emergency room for abdominal pain and abnormal CT scan of the abdomen  Summary of history :  I have previously seen him in July 2018 for abdominal pain.  It was following an ER visit.  He had a CT scan of the abdomen which showed inflammation of the sigmoid colon extending into the rectum.  Differentials were diverticulitis or colitis.  Small retroperitoneal mesenteric lymph nodes were seen.  He was given a course of Flagyl and ciprofloxacin and asked to follow-up with GI.  At that point of time he had had abdominal pain for less than 3 weeks and is gradually getting better.  He has been taking Advil's and having 2-3 bowel movements per day.  He had an elevated fecal lactoferrin and a CRP.  A colonoscopy was attempted but it was very tight and tortuous sigmoid colon which I could not go past hence had to abort the procedure.  Rectal biopsies were normal.  I requested for a CT colonography but was denied by the insurance.  At that point of time was drinking a lot of alcohol.  Having 3-5 bowel movements per day.  No blood in the stool.  I subsequently requested a barium enema to evaluate the sigmoid colon but she did not obtain the barium enema as he had retained stool in the left colon and rectum which could not permit the barium enema study was rescheduled but he did not obtain.  He subsequently did not follow-up.  Interval history     Presented to the emergency room on 11/01/2018 with right lower quadrant pain.  He underwent a CT scan of the abdomen which showed a masslike wall thickening  involving the cecum and ascending colon, terminal ileum with surrounding inflammatory changes.  Differentials included infectious/inflammatory enterocolitis as well as colonic neoplasm.  Hemoglobin 12.7 g with an MCV of 83.1.  CMP normal.  Lipase 22.  He states that he did not follow-up after his last office visit since he did not have any insurance and did not have any money to pay for this visit.  He states that he was doing pretty fine since then but over the last 3 weeks developed right-sided abdominal pain.  The pain got worse and hence went to the ER.  After commencing the antibiotics he feels a bit better but the pain has not completely resolved.  Denies any rectal bleeding but he has been having diarrhea.  Denies any weight loss.  Still does not have any insurance and has to pay cash for all his care.  Denies any joint pain or skin rashes.  Denies any NSAID use.   Past Surgical History:  Procedure Laterality Date  . COLONOSCOPY WITH PROPOFOL N/A 08/16/2016   Procedure: COLONOSCOPY WITH PROPOFOL;  Surgeon: Tristan Bellows, MD;  Location: [facility] ENDOSCOPY;  Service: Endoscopy;  Laterality: N/A;    Prior to Admission medications   Medication Sig Start Date End Date Taking? Authorizing Provider  albuterol (PROVENTIL HFA;VENTOLIN HFA) 108 (90 Base) MCG/ACT inhaler Inhale 2 puffs into the lungs every 4 (four) hours as needed  for wheezing. 12/01/17   Marylene Land, NP  famotidine (PEPCID) 40 MG tablet Take 40 mg by mouth 2 (two) times daily. 09/16/18   [provider]  HYDROcodone-acetaminophen (NORCO/VICODIN) 5-325 MG tablet Take 1-2 tablets by mouth every 4 (four) hours as needed for moderate pain or severe pain. 11/01/18 11/01/19  Tristan Bruce, MD  levocetirizine (XYZAL) 5 MG tablet Take 5 mg by mouth 2 (two) times daily. 09/16/18   [provider]  montelukast (SINGULAIR) 10 MG tablet Take 10 mg by mouth daily. 09/16/18   [provider]    Family History  Problem Relation  Age of Onset  . Diabetes Mother   . Healthy Father      Social History   Tobacco Use  . Smoking status: Current Every Day Smoker    Packs/day: 1.00  . Smokeless tobacco: Never Used  Substance Use Topics  . Alcohol use: Not Currently  . Drug use: No    Allergies as of 11/13/2018  . (No Known Allergies)    Review of Systems:    All systems reviewed and negative except where noted in HPI.   Physical Exam:  There were no vitals taken for this visit. No LMP for male patient. Psych:  Alert and cooperative. Normal mood and affect. General:   Alert,  Well-developed, well-nourished, pleasant and cooperative in NAD Head:  Normocephalic and atraumatic. Eyes:  Sclera clear, no icterus.   Conjunctiva pink. Ears:  Normal auditory acuity. Nose:  No deformity, discharge, or lesions. Mouth:  No deformity or lesions,oropharynx pink & moist. Neck:  Supple; no masses or thyromegaly. Lungs:  Respirations even and unlabored.  Clear throughout to auscultation.   No wheezes, crackles, or rhonchi. No acute distress. Heart:  Regular rate and rhythm; no murmurs, clicks, rubs, or gallops. Abdomen:  Normal bowel sounds.  No bruits.  Soft, non-tender and non-distended without masses, hepatosplenomegaly or hernias noted.  No guarding or rebound tenderness.    Neurologic:  Alert and oriented x3;  grossly normal neurologically. Skin:  Intact without significant lesions or rashes. No jaundice. Lymph Nodes:  No significant cervical adenopathy. Psych:  Alert and cooperative. Normal mood and affect.  Imaging Studies: Ct Abdomen Pelvis W Contrast  Result Date: 11/01/2018 CLINICAL DATA:  Right lower quadrant pain x3 weeks EXAM: CT ABDOMEN AND PELVIS WITH CONTRAST TECHNIQUE: Multidetector CT imaging of the abdomen and pelvis was performed using the standard protocol following bolus administration of intravenous contrast. CONTRAST:  125mL OMNIPAQUE IOHEXOL 300 MG/ML  SOLN COMPARISON:  None. FINDINGS: Lower  chest: Lung bases are clear. Hepatobiliary: Liver is within normal limits. Gallbladder is unremarkable. No intrahepatic or extrahepatic ductal dilatation. Pancreas: Within normal limits. Spleen: Within normal limits. Adrenals/Urinary Tract: Adrenal glands are within normal limits. Kidneys are within normal limits.  No hydronephrosis. Bladder is within normal limits. Stomach/Bowel: Stomach is within normal limits. No evidence of bowel obstruction. Appendix is mildly prominent with surrounding mild inflammatory changes (series 2/image 56), although this appearance is favored to be secondary. Masslike wall thickening involving the cecum/ascending colon and terminal ileum (coronal image 34). Surrounding inflammatory changes. While this appearance may simply reflect infectious/inflammatory enterocolitis, given the degree of wall thickening, colonic neoplasm is important to exclude. Vascular/Lymphatic: No evidence of abdominal aortic aneurysm. Small ileocolic nodes measuring up to 8 mm short axis (series 2/images 40 and 47). Reproductive: Prostate is unremarkable. Other: Trace pelvic fluid. Musculoskeletal: Mild degenerative changes of the lumbar spine. IMPRESSION: Masslike wall thickening involving the cecum/ascending colon and  terminal ileum, with surrounding inflammatory changes. While this appearance may simply reflect infectious/inflammatory enterocolitis, given the degree of wall thickening, colonic neoplasm is important to exclude. Electronically Signed   By: Tristan [name] M.D.   On: 11/01/2018 12:47    Assessment and Plan:   Tristan [name] is a 38 y.o. y/o male has been referred to see me today after an ER visit on 11/01/2018 for abdominal pain.  CT scan of the abdomen shows inflammation and thickening of the terminal ileum and cecum and ascending colon.  Differentials include inflammatory/infectious colitis as well as neoplasm.  I had attempted a colonoscopy 2 years back and could not get past the  sigmoid colon.  We were in the process of obtaining a barium enema but he did not follow-up and did not return back to clinic for over 2 years.  His main issue is that he does not have insurance and hence cannot afford any of the tests I am ordering.  He says that if I order a test which is a few thousand dollars it would be impossible for him to obtain.  Hence I suggest we start with a barium enema to determine if there is any blockage of the sigmoid colon and subsequently if there is none I can proceed with a colonoscopy to determine if there is any evidence of malignancy or inflammatory bowel disease to explain the abnormality seen on the CAT scan.  I will also try and help with patient assistance and try and get him in touch with the appropriate people in the hospital.  I would also like to suggest we check a stool for C. difficile and GI PCR since he is having diarrhea which may be related to the antibiotics.  I did explain to him my differential diagnosis for the abnormal CT scan which would include infection, inflammatory bowel disease or alpha malignancy although he is very young for the same it is possible although rarely   Follow up in 2 weeks time via telephone visit  Dr Tristan Bellows MD,MRCP(U.K)

## 2018-11-18 ENCOUNTER — Encounter: Payer: Self-pay | Admitting: Gastroenterology

## 2018-11-19 ENCOUNTER — Other Ambulatory Visit: Payer: Self-pay

## 2018-11-19 LAB — C DIFFICILE, CYTOTOXIN B

## 2018-11-19 LAB — C DIFFICILE TOXINS A+B W/RFLX: C difficile Toxins A+B, EIA: NEGATIVE

## 2018-11-20 LAB — GI PROFILE, STOOL, PCR

## 2018-12-29 ENCOUNTER — Other Ambulatory Visit: Payer: Self-pay

## 2018-12-29 ENCOUNTER — Other Ambulatory Visit: Payer: Self-pay | Admitting: Diagnostic Radiology

## 2018-12-29 ENCOUNTER — Emergency Department
Admission: EM | Admit: 2018-12-29 | Discharge: 2018-12-29 | Disposition: A | Discharge status: Home / Self Care | Payer: Self-pay | Attending: Emergency Medicine | Admitting: Emergency Medicine

## 2018-12-29 ENCOUNTER — Encounter: Payer: Self-pay | Admitting: Emergency Medicine

## 2018-12-29 DIAGNOSIS — R109 Unspecified abdominal pain: Secondary | ICD-10-CM

## 2018-12-29 DIAGNOSIS — Z79899 Other long term (current) drug therapy: Secondary | ICD-10-CM | POA: Insufficient documentation

## 2018-12-29 DIAGNOSIS — R1031 Right lower quadrant pain: Secondary | ICD-10-CM | POA: Insufficient documentation

## 2018-12-29 DIAGNOSIS — F17210 Nicotine dependence, cigarettes, uncomplicated: Secondary | ICD-10-CM | POA: Insufficient documentation

## 2018-12-29 DIAGNOSIS — R933 Abnormal findings on diagnostic imaging of other parts of digestive tract: Secondary | ICD-10-CM

## 2018-12-29 LAB — LIPASE, BLOOD: Lipase: 16 U/L (ref 11–51)

## 2018-12-29 LAB — COMPREHENSIVE METABOLIC PANEL
ALT: 13 U/L (ref 0–44)
AST: 15 U/L (ref 15–41)
Albumin: 3.4 g/dL — ABNORMAL LOW (ref 3.5–5.0)
Alkaline Phosphatase: 73 U/L (ref 38–126)
Anion gap: 9 (ref 5–15)
BUN: 7 mg/dL (ref 6–20)
CO2: 27 mmol/L (ref 22–32)
Calcium: 8.9 mg/dL (ref 8.9–10.3)
Chloride: 103 mmol/L (ref 98–111)
Creatinine, Ser: 0.64 mg/dL (ref 0.61–1.24)
GFR calc Af Amer: >60 mL/min (ref >60)
GFR calc non Af Amer: >60 mL/min (ref >60)
Glucose, Bld: 108 mg/dL — ABNORMAL HIGH (ref 70–99)
Potassium: 4 mmol/L (ref 3.5–5.1)
Sodium: 139 mmol/L (ref 135–145)
Total Bilirubin: 0.5 mg/dL (ref 0.3–1.2)
Total Protein: 7.6 g/dL (ref 6.5–8.1)

## 2018-12-29 LAB — CBC
HCT: 35.2 % — ABNORMAL LOW (ref 39.0–52.0)
Hemoglobin: 10.9 g/dL — ABNORMAL LOW (ref 13.0–17.0)
MCH: 24 pg — ABNORMAL LOW (ref 26.0–34.0)
MCHC: 31 g/dL (ref 30.0–36.0)
MCV: 77.5 fL — ABNORMAL LOW (ref 80.0–100.0)
Platelets: 270 10*3/uL (ref 150–400)
RBC: 4.54 MIL/uL (ref 4.22–5.81)
RDW: 14.6 % (ref 11.5–15.5)
WBC: 7.8 10*3/uL (ref 4.0–10.5)
nRBC: 0 % (ref 0.0–0.2)

## 2018-12-29 LAB — URINALYSIS, COMPLETE (UACMP) WITH MICROSCOPIC
Bacteria, UA: NONE SEEN
Bilirubin Urine: NEGATIVE
Glucose, UA: NEGATIVE mg/dL
Hgb urine dipstick: NEGATIVE
Ketones, ur: NEGATIVE mg/dL
Leukocytes,Ua: NEGATIVE
Nitrite: NEGATIVE
Protein, ur: NEGATIVE mg/dL
Specific Gravity, Urine: 1.021 (ref 1.005–1.030)
Squamous Epithelial / HPF: NONE SEEN (ref 0–5)
pH: 6 (ref 5.0–8.0)

## 2018-12-29 MED ORDER — HYDROCODONE-ACETAMINOPHEN 5-325 MG PO TABS: 1.0000 | ORAL_TABLET | ORAL | 0 refills | Status: DC | PRN

## 2018-12-29 MED ORDER — MORPHINE SULFATE (PF) 4 MG/ML IV SOLN
4.0000 mg | Freq: Once | INTRAVENOUS | Status: AC
  Administered 2018-12-29: 4 mg via INTRAVENOUS
  Filled 2018-12-29: qty 1

## 2018-12-29 NOTE — ED Triage Notes (Signed)
Pt reports for the last several months has had pain to his right lower abd that sometimes radiates around to his back. Pt reports pain gets worse if he walks or does certain things and only feels better if he lays flat. Pt states was seen here for the same and had a CT scan and was given abx but it has not helped.

## 2018-12-29 NOTE — ED Provider Notes (Signed)
[facility] Emergency Department Provider Note  Time seen: 12:39 PM  I have reviewed the triage vital signs and the nursing notes.   HISTORY  Chief Complaint Abdominal Pain and Back Pain   HPI Tristan [name] is a 38 y.o. male who presents to the emergency department for abdominal pain.  Patient states over the past several years he has been experiencing lower abdominal pain, states he is attempted to have a colonoscopy in 2018 for the same pain but it was unsuccessful.  Patient was seen in August of this year and had a CT scan which did show some findings concerning for bowel wall thickening of the cecum area.  Patient was placed on antibiotics and told to follow-up with GI medicine but the patient has not followed up.  Patient states he continues to have moderate abdominal pain in the right lower quadrant.  Denies any worsening of the pain but states it is not improved either.  Denies any nausea vomiting or diarrhea.  No dysuria.   History reviewed. No pertinent past medical history.  Patient Active Problem List   Diagnosis Date Noted  . Genital warts 07/15/2015    Past Surgical History:  Procedure Laterality Date  . COLONOSCOPY WITH PROPOFOL N/A 08/16/2016   Procedure: COLONOSCOPY WITH PROPOFOL;  Surgeon: [name], MD;  Location: [facility] ENDOSCOPY;  Service: Endoscopy;  Laterality: N/A;    Prior to Admission medications   Medication Sig Start Date End Date Taking? Authorizing Provider  albuterol (PROVENTIL HFA;VENTOLIN HFA) 108 (90 Base) MCG/ACT inhaler Inhale 2 puffs into the lungs every 4 (four) hours as needed for wheezing. Patient not taking: Reported on 11/13/2018 12/01/17   Marylene Land, NP  famotidine (PEPCID) 40 MG tablet Take 40 mg by mouth 2 (two) times daily. 09/16/18   [provider]  HYDROcodone-acetaminophen (NORCO/VICODIN) 5-325 MG tablet Take 1-2 tablets by mouth every 4 (four) hours as needed for moderate pain or severe pain. Patient  not taking: Reported on 11/13/2018 11/01/18 11/01/19  Duffy Bruce, MD  levocetirizine (XYZAL) 5 MG tablet Take 5 mg by mouth 2 (two) times daily. 09/16/18   [provider]  montelukast (SINGULAIR) 10 MG tablet Take 10 mg by mouth daily. 09/16/18   [provider]    No Known Allergies  Family History  Problem Relation Age of Onset  . Diabetes Mother   . Healthy Father     Social History Social History   Tobacco Use  . Smoking status: Current Every Day Smoker    Packs/day: 1.00  . Smokeless tobacco: Never Used  Substance Use Topics  . Alcohol use: Not Currently  . Drug use: No    Review of Systems Constitutional: Negative for fever. Cardiovascular: Negative for chest pain. Respiratory: Negative for shortness of breath. Gastrointestinal: Right lower quadrant abdominal pain. Genitourinary: Negative for urinary compaints Musculoskeletal: Negative for musculoskeletal complaints Skin: Negative for skin complaints  Neurological: Negative for headache All other ROS negative  ____________________________________________   PHYSICAL EXAM:  VITAL SIGNS: ED Triage Vitals  Enc Vitals Group     BP 12/29/18 0809 123/73     Pulse Rate 12/29/18 0809 (!) 106     Resp 12/29/18 0809 20     Temp 12/29/18 0809 99 F (37.2 C)     Temp Source 12/29/18 0809 Oral     SpO2 12/29/18 0809 97 %     Weight 12/29/18 0806 180 lb (81.6 kg)     Height 12/29/18 0806 6\' 1"  (  1.854 m)     Head Circumference --      Peak Flow --      Pain Score 12/29/18 0805 3     Pain Loc --      Pain Edu? --      Excl. in [place]? --    Constitutional: Alert and oriented. Well appearing and in no distress. Eyes: Normal exam ENT      Head: Normocephalic and atraumatic.      Mouth/Throat: Mucous membranes are moist. Cardiovascular: Normal rate, regular rhythm.  Respiratory: Normal respiratory effort without tachypnea nor retractions. Breath sounds are clear  Gastrointestinal: Soft, minimal right  lower quadrant tenderness to palpation without rebound guarding or distention. Musculoskeletal: Nontender with normal range of motion in all extremities. No lower extremity tenderness Neurologic:  Normal speech and language. No gross focal neurologic deficits Skin:  Skin is warm, dry and intact.  Psychiatric: Mood and affect are normal.  ____________________________________________    INITIAL IMPRESSION / ASSESSMENT AND PLAN / ED COURSE  Pertinent labs & imaging results that were available during my care of the patient were reviewed by me and considered in my medical decision making (see chart for details).   Patient presents emergency department for lower abdominal pain unchanged over the past several months been present for the past several years intermittently.  Patient's work-up today is largely reassuring, mild tenderness on exam, lab work is normal.  I reviewed the patient's recent CT scan in August.  Given these findings I did offer to repeat a CT scan today, patient does not wish to have a CT scan does not believe anything would have changed.  I discussed with the patient the need to follow-up with GI for further evaluation as this could possibly indicate inflammatory bowel disease.  Patient agreeable to plan of care.  We will cover with a short course of pain medication.  [name] was evaluated in Emergency Department on 12/29/2018 for the symptoms described in the history of present illness. He was evaluated in the context of the global COVID-19 pandemic, which necessitated consideration that the patient might be at risk for infection with the SARS-CoV-2 virus that causes COVID-19. Institutional protocols and algorithms that pertain to the evaluation of patients at risk for COVID-19 are in a state of rapid change based on information released by regulatory bodies including the CDC and federal and state organizations. These policies and algorithms were followed during the patient's  care in the ED.  ____________________________________________   FINAL CLINICAL IMPRESSION(S) / ED DIAGNOSES  Abdominal pain   Harvest Dark, MD 12/29/18 1249

## 2018-12-30 ENCOUNTER — Telehealth: Payer: Self-pay

## 2018-12-30 NOTE — Telephone Encounter (Signed)
Spoke with pt and informed him of his barium enema appointment information and prior prep. Pt agrees and understands.

## 2019-01-02 ENCOUNTER — Other Ambulatory Visit: Payer: Self-pay | Admitting: Gastroenterology

## 2019-01-02 ENCOUNTER — Other Ambulatory Visit: Payer: Self-pay

## 2019-01-02 ENCOUNTER — Ambulatory Visit
Admission: RE | Admit: 2019-01-02 | Discharge: 2019-01-02 | Disposition: A | Discharge status: Home / Self Care | Payer: Self-pay | Source: Ambulatory Visit | Attending: Gastroenterology | Admitting: Gastroenterology

## 2019-01-02 DIAGNOSIS — R933 Abnormal findings on diagnostic imaging of other parts of digestive tract: Secondary | ICD-10-CM

## 2019-01-02 DIAGNOSIS — R109 Unspecified abdominal pain: Secondary | ICD-10-CM

## 2019-01-02 MED ORDER — PEG 3350-KCL-NA BICARB-NACL 420 G PO SOLR: 4000.0000 mL | Freq: Once | ORAL | 0 refills | Status: AC

## 2019-01-02 NOTE — Addendum Note (Signed)
Addended by: Dorethea Clan on: 01/02/2019 11:05 AM   Modules accepted: Orders

## 2019-01-02 NOTE — Progress Notes (Signed)
Please reschedule

## 2019-01-05 ENCOUNTER — Other Ambulatory Visit (HOSPITAL_COMMUNITY): Payer: Self-pay

## 2019-01-09 ENCOUNTER — Ambulatory Visit
Admission: RE | Admit: 2019-01-09 | Discharge: 2019-01-09 | Disposition: A | Discharge status: Home / Self Care | Payer: Self-pay | Source: Ambulatory Visit | Attending: Gastroenterology | Admitting: Gastroenterology

## 2019-01-09 ENCOUNTER — Other Ambulatory Visit: Payer: Self-pay

## 2019-01-09 DIAGNOSIS — R109 Unspecified abdominal pain: Secondary | ICD-10-CM | POA: Insufficient documentation

## 2019-01-09 DIAGNOSIS — R933 Abnormal findings on diagnostic imaging of other parts of digestive tract: Secondary | ICD-10-CM | POA: Insufficient documentation

## 2019-01-12 ENCOUNTER — Ambulatory Visit (INDEPENDENT_AMBULATORY_CARE_PROVIDER_SITE_OTHER): Payer: Self-pay | Admitting: Gastroenterology

## 2019-01-12 ENCOUNTER — Other Ambulatory Visit: Payer: Self-pay

## 2019-01-12 DIAGNOSIS — R933 Abnormal findings on diagnostic imaging of other parts of digestive tract: Secondary | ICD-10-CM

## 2019-01-12 NOTE — Progress Notes (Signed)
Tristan [name] , MD [address]  [place]  [place], [place] [address]  Main: [phone]  Fax: [phone]   Primary Care Physician: Patient, No Pcp Per  Virtual Visit via Telephone Note  I connected with patient on 01/12/19 at 11:00 AM EST by telephone and verified that I am speaking with the correct person using two identifiers.   I discussed the limitations, risks, security and privacy concerns of performing an evaluation and management service by telephone and the availability of in person appointments. I also discussed with the patient that there may be a patient responsible charge related to this service. The patient expressed understanding and agreed to proceed.  Location of Patient: Home Location of Provider: Home Persons involved: Patient and provider only   History of Present Illness:   Here to discuss results of the barium enema  HPI: Tristan [name] is a 38 y.o. male   Summary of history :  I have previously seen him in July 2018 for abdominal pain.  It was following an ER visit.  He had a CT scan of the abdomen which showed inflammation of the sigmoid colon extending into the rectum.  Differentials were diverticulitis or colitis.  Small retroperitoneal mesenteric lymph nodes were seen.  He was given a course of Flagyl and ciprofloxacin and asked to follow-up with GI.  At that point of time he had had abdominal pain for less than 3 weeks and is gradually getting better.  He has been taking Advil's and having 2-3 bowel movements per day.  He had an elevated fecal lactoferrin and a CRP.  A colonoscopy was attempted but it was very tight and tortuous sigmoid colon which I could not go past hence had to abort the procedure.  Rectal biopsies were normal.  I requested for a CT colonography but was denied by the insurance.  At that point of time was drinking a lot of alcohol.  Having 3-5 bowel movements per day.  No blood in the stool.  I subsequently requested a barium  enema to evaluate the sigmoid colon but he did not obtain the barium enema as he had retained stool in the left colon and rectum which could not permit the barium enema study was rescheduled but he did not obtain.   He states that he did not follow-up after his last office visit since he did not have any insurance and did not have any money to pay for this visit.   Presented to the emergency room on 11/01/2018 with right lower quadrant pain.  He underwent a CT scan of the abdomen which showed a masslike wall thickening involving the cecum and ascending colon, terminal ileum with surrounding inflammatory changes.  Differentials included infectious/inflammatory enterocolitis as well as colonic neoplasm.  Hemoglobin 12.7 g with an MCV of 83.1.  CMP normal.  Lipase 22.   Interval history   11/13/2018-01/12/2019  01/09/2019 barium enema: A very large irregular apple core lesion involving the lower portion of the right colon and cecum was noted these findings are most consistent with colon cancer correlate with CT findings.  Extensive sigmoid diverticulosis scattered left colon diverticulosis.  Evaluation of the sigmoid colon was difficult because of this.    Current Outpatient Medications  Medication Sig Dispense Refill   albuterol (PROVENTIL HFA;VENTOLIN HFA) 108 (90 Base) MCG/ACT inhaler Inhale 2 puffs into the lungs every 4 (four) hours as needed for wheezing. (Patient not taking: Reported on 11/13/2018) 1 Inhaler 0   famotidine (  PEPCID) 40 MG tablet Take 40 mg by mouth 2 (two) times daily.     HYDROcodone-acetaminophen (NORCO/VICODIN) 5-325 MG tablet Take 1 tablet by mouth every 4 (four) hours as needed. 15 tablet 0   levocetirizine (XYZAL) 5 MG tablet Take 5 mg by mouth 2 (two) times daily.     montelukast (SINGULAIR) 10 MG tablet Take 10 mg by mouth daily.     No current facility-administered medications for this visit.     Allergies as of 01/12/2019   (No Known Allergies)    Review of  Systems:    All systems reviewed and negative except where noted in HPI.   Observations/Objective:  Labs: CMP     Component Value Date/Time   NA 139 12/29/2018 0811   K 4.0 12/29/2018 0811   CL 103 12/29/2018 0811   CO2 27 12/29/2018 0811   GLUCOSE 108 (H) 12/29/2018 0811   BUN 7 12/29/2018 0811   CREATININE 0.64 12/29/2018 0811   CALCIUM 8.9 12/29/2018 0811   PROT 7.6 12/29/2018 0811   ALBUMIN 3.4 (L) 12/29/2018 0811   AST 15 12/29/2018 0811   ALT 13 12/29/2018 0811   ALKPHOS 73 12/29/2018 0811   BILITOT 0.5 12/29/2018 0811   GFRNONAA >60 12/29/2018 0811   GFRAA >60 12/29/2018 0811   Lab Results  Component Value Date   WBC 7.8 12/29/2018   HGB 10.9 (L) 12/29/2018   HCT 35.2 (L) 12/29/2018   MCV 77.5 (L) 12/29/2018   PLT 270 12/29/2018    Imaging Studies: Dg Abd 1 View  Result Date: 01/02/2019 CLINICAL DATA:  Scout image for BE. Patient not given full preparation. EXAM: AIR CONTRAST BARIUM ENEMA TECHNIQUE: Barium enema not performed due to lack of preparation. FLUOROSCOPY TIME:  None. COMPARISON:  CT 11/01/2018. FINDINGS: Patient not given a full bowel prep. Stool is noted throughout the colon and rectum. Barium enema not performed. Full preparation of the patient is suggested prior to BE. Patient can be rescheduled. IMPRESSION: Patient not given a full bowel prep. Stool noted throughout the colon and rectum. Barium enema not performed. Full preparation of the patient is suggested prior to BE. Patient can be rescheduled. Electronically Signed   By: Marcello Moores  Register   On: 01/02/2019 10:25   Dg Be (colon) W Double Cm (hd)  Result Date: 01/09/2019 CLINICAL DATA:  Abnormal CT scan.  Abdominal pain. EXAM: AIR CONTRAST BARIUM ENEMA TECHNIQUE: Initial scout AP supine abdominal image obtained to insure adequate colon cleansing. Barium was introduced into the colon in a retrograde fashion and refluxed from the rectum to the distal transverse colon. As much of the barium as possible  was then removed through the indwelling tube via gravity drain. Air was then insufflated into the colon. Spot images of the colon followed by overhead radiographs were obtained. FLUOROSCOPY TIME:  Fluoroscopy Time:  4 minutes 36 seconds Radiation Exposure Index (if provided by the fluoroscopic device): 144.2 mGy COMPARISON:  CT 11/01/2018. FINDINGS: Scout film unremarkable. Extensive sigmoid diverticulosis noted. This makes evaluation of the sigmoid colon difficult. Scattered left colon diverticula are noted. A very large irregular apple-core lesion involving the lower portion of the right colon and cecum is noted. Although these findings could be inflammatory/infectious, these findings are most consistent with a colon cancer. These findings correlate with recent CT findings. IMPRESSION: 1. A very large irregular apple-core lesion involving the lower portion of the right colon and cecum is noted. These findings are most consistent with colon cancer. These findings correlate  with recent CT findings. 2. Extensive sigmoid diverticulosis. Scattered left colon diverticulosis. These results will be called to the ordering clinician or representative by the Radiologist Assistant, and communication documented in the PACS or zVision Dashboard. Electronically Signed   By: Marcello Moores  Register   On: 01/09/2019 12:09    Assessment and Plan:   Tristan [name] is a 39 y.o. y/o male was initially seen by me back in 2018 with an abnormal CT scan of the abdomen showing a possible mass in the right colon.  I performed a colonoscopy which I could not get past the sigmoid colon due to significant luminal narrowing.  I subsequently intended to obtain a CT colonography but his insurance did not authorize and hence wanted to get a barium enema to determine if there is any blockage of the sigmoid colon.  He did not obtain the same as he said he did not any money for the co-pay.  He subsequently was lost to follow-up and presented back to  the emergency room in 10/30/2018 with abdominal pain.CT scan of the abdomen shows inflammation and thickening of the terminal ileum and cecum and ascending colon.  Differentials include inflammatory/infectious colitis as well as neoplasm.  Repeat attempt at barium enema showed there was significant colonic diverticulosis in the sigmoid colon which also limited visualization of that area in addition there is an apple core lesion seen in the right colon.  He is here today to discuss the results.  I informed him that it is very important that he follows through with our plan at this point of time as it is not possible for me to obtain a biopsy in the right colon as there is significant luminal narrowing in the sigmoid colon which have not permitted the colonoscopy to pass through..  I suggested we refer him to a surgeon urgently and he undergo a right hemicolectomy with possible sigmoid colon resection as well.  This will give him a tissue diagnosis and subsequently depending on the stage of the lesion if cancerous will definitely need a repeat colonoscopy after surgery to evaluate the remainder of the colon.  Luminal obstruction from either the mass in the right colon or the significant sigmoid diverticulosis.  Plan 1.  Urgent referral to Dr. Dahlia Byes to discuss surgical options.   f/u in 4 weeks   I discussed the assessment and treatment plan with the patient. The patient was provided an opportunity to ask questions and all were answered. The patient agreed with the plan and demonstrated an understanding of the instructions.   The patient was advised to call back or seek an in-person evaluation if the symptoms worsen or if the condition fails to improve as anticipated.  I provided 12 minutes of non-face-to-face time during this encounter.  Dr Tristan Bellows MD,MRCP [facility]) Gastroenterology/Hepatology Pager: [phone]   Speech recognition software was used to dictate this note.

## 2019-01-14 ENCOUNTER — Other Ambulatory Visit
Admission: RE | Admit: 2019-01-14 | Discharge: 2019-01-14 | Disposition: A | Discharge status: Home / Self Care | Payer: Self-pay | Source: Ambulatory Visit | Attending: Surgery | Admitting: Surgery

## 2019-01-14 ENCOUNTER — Telehealth: Payer: Self-pay

## 2019-01-14 ENCOUNTER — Other Ambulatory Visit: Payer: Self-pay

## 2019-01-14 ENCOUNTER — Encounter: Payer: Self-pay | Admitting: Surgery

## 2019-01-14 ENCOUNTER — Ambulatory Visit (INDEPENDENT_AMBULATORY_CARE_PROVIDER_SITE_OTHER): Payer: Self-pay | Admitting: Surgery

## 2019-01-14 VITALS — BP 110/62 | HR 98 | Temp 97.7°F | Ht 73.0 in | Wt 178.0 lb

## 2019-01-14 DIAGNOSIS — K6389 Other specified diseases of intestine: Secondary | ICD-10-CM

## 2019-01-14 LAB — COMPREHENSIVE METABOLIC PANEL
ALT: 38 U/L (ref 0–44)
AST: 38 U/L (ref 15–41)
Albumin: 3.1 g/dL — ABNORMAL LOW (ref 3.5–5.0)
Alkaline Phosphatase: 57 U/L (ref 38–126)
Anion gap: 14 (ref 5–15)
BUN: 7 mg/dL (ref 6–20)
CO2: 27 mmol/L (ref 22–32)
Calcium: 8.7 mg/dL — ABNORMAL LOW (ref 8.9–10.3)
Chloride: 96 mmol/L — ABNORMAL LOW (ref 98–111)
Creatinine, Ser: 0.75 mg/dL (ref 0.61–1.24)
GFR calc Af Amer: >60 mL/min (ref >60)
GFR calc non Af Amer: >60 mL/min (ref >60)
Glucose, Bld: 113 mg/dL — ABNORMAL HIGH (ref 70–99)
Potassium: 3.3 mmol/L — ABNORMAL LOW (ref 3.5–5.1)
Sodium: 137 mmol/L (ref 135–145)
Total Bilirubin: 0.6 mg/dL (ref 0.3–1.2)
Total Protein: 8 g/dL (ref 6.5–8.1)

## 2019-01-14 MED ORDER — NEOMYCIN SULFATE 500 MG PO TABS: ORAL_TABLET | ORAL | 0 refills | Status: DC

## 2019-01-14 MED ORDER — BISACODYL 5 MG PO TBEC: DELAYED_RELEASE_TABLET | ORAL | 0 refills | Status: DC

## 2019-01-14 MED ORDER — KETOROLAC TROMETHAMINE 10 MG PO TABS: 10.0000 mg | ORAL_TABLET | Freq: Four times a day (QID) | ORAL | 0 refills | Status: DC | PRN

## 2019-01-14 MED ORDER — POLYETHYLENE GLYCOL 3350 17 GM/SCOOP PO POWD: ORAL | 0 refills | Status: DC

## 2019-01-14 MED ORDER — ERYTHROMYCIN BASE 500 MG PO TABS: ORAL_TABLET | ORAL | 0 refills | Status: DC

## 2019-01-14 MED ORDER — GABAPENTIN 300 MG PO CAPS: 300.0000 mg | ORAL_CAPSULE | Freq: Three times a day (TID) | ORAL | 1 refills | Status: DC

## 2019-01-14 NOTE — Telephone Encounter (Signed)
-----   Message from Jules Husbands, MD sent at 01/14/2019  4:25 PM EST ----- Please let him know labs are nml ----- Message ----- From: Interface, Lab In [place] Sent: 01/14/2019   1:12 PM EST To: Jules Husbands, MD

## 2019-01-14 NOTE — Patient Instructions (Addendum)
Please go to the Lab today at [facility]. Enter in through the [place] entrance. You may also pickup your prep kit at Radiology.   Patient has been scheduled for a CT abdomen/pelvis witht contrast at [facility] for 01/21/19 at 2:30 pm, please arrive by 2:15 pm. Prep: NPO 4 hours prior and pick up prep kit.    Please pick up your medication at the pharmacy.

## 2019-01-14 NOTE — Telephone Encounter (Signed)
Per Dr.Pabon to notify patient that recent lab results were normal. Patient verbalized understanding.

## 2019-01-15 ENCOUNTER — Telehealth: Payer: Self-pay | Admitting: Surgery

## 2019-01-15 NOTE — Telephone Encounter (Signed)
Spoke with patient at this time. He was prescribed gabapentin and toradol yesterday however the pharmacist had to order the gabapentin. He states he is still having pain and he was instructed to pick up his gabapentin today after work and take the 2 medications and to allow time to work. He said  he would do so and call back if he needed to.

## 2019-01-15 NOTE — Telephone Encounter (Signed)
Patient is calling said he has been taken Ketorolac that was called in for the pain, patient said this isn't really helping and is still having the pain, patient is just asking if we could call in something else for the pain. Please call patient and advise.

## 2019-01-16 ENCOUNTER — Telehealth: Payer: Self-pay | Admitting: Surgery

## 2019-01-16 NOTE — Telephone Encounter (Signed)
Pt has been advised of pre admission date/time, Covid Testing date and Surgery date.  Surgery Date: 01/30/19 with Dr Dola Argyle hand assisted right colectomy.  Preadmission Testing Date: 01/26/19-phone interview-between 8-12:00pm. Covid Testing Date: 01/27/19 between 8-10:30am - patient advised to go to the [place] ([place])  [employer] Video sent via [employer] [facility] and [employer].  Patient has been made aware to call [phone], between 1-3:00pm the day before surgery, to find out what time to arrive.    I have also mailed the bowel prep instructions to the patient's confirmed address.

## 2019-01-16 NOTE — Progress Notes (Signed)
Patient ID: [name], male   DOB: [birth year], 38 y.o.   MRN: [id number]  HPI Tristan [name] is a 38 y.o. male seen in consultation at the request of Dr. Vicente Males.  He has had chronic abdominal pain since 2018.  He reports that the pain is intermittent moderate intensity and sharp.  The pain is located in the right lower quadrant.  No specific alleviating or aggravating factors.  He has been seen by GI and work-up has been performed.  He has also received antibiotics without any changes.  He also had a history of diverticulitis at some point in time that was appropriately treated. Did have a attempted colonoscopy 2019 by Dr. Vicente Males but he was unable to reach the cecum. He consequently lost to follow-up.  Most recently he went to the emergency room where CT scan was performed.  I have personally reviewed the images showing a masslike within the cecum and ascending colon.  There is some inflammatory changes.Marland Kitchen  He then had a barium enema that I have also reviewed showing evidence of an apple core lesion within the cecum.   HPI  Past Medical History:  Diagnosis Date  . Asthma   . GERD (gastroesophageal reflux disease)     Past Surgical History:  Procedure Laterality Date  . COLONOSCOPY WITH PROPOFOL N/A 08/16/2016   Procedure: COLONOSCOPY WITH PROPOFOL;  Surgeon: [name], MD;  Location: [facility] ENDOSCOPY;  Service: Endoscopy;  Laterality: N/A;    Family History  Problem Relation Age of Onset  . Diabetes Mother   . Healthy Father   . Colon polyps Father   . Colon cancer Other   . Ulcerative colitis Paternal Aunt     Social History Social History   Tobacco Use  . Smoking status: Current Every Day Smoker    Packs/day: 0.75    Types: Cigarettes  . Smokeless tobacco: Never Used  Substance Use Topics  . Alcohol use: Not Currently  . Drug use: No    No Known Allergies  Current Outpatient Medications  Medication Sig Dispense Refill  . albuterol (PROVENTIL HFA;VENTOLIN HFA) 108 (90  Base) MCG/ACT inhaler Inhale 2 puffs into the lungs every 4 (four) hours as needed for wheezing. 1 Inhaler 0  . famotidine (PEPCID) 40 MG tablet Take 40 mg by mouth 2 (two) times daily.    Marland Kitchen HYDROcodone-acetaminophen (NORCO/VICODIN) 5-325 MG tablet Take 1 tablet by mouth every 4 (four) hours as needed. 15 tablet 0  . levocetirizine (XYZAL) 5 MG tablet Take 5 mg by mouth 2 (two) times daily.    . montelukast (SINGULAIR) 10 MG tablet Take 10 mg by mouth daily.    . bisacodyl (DULCOLAX) 5 MG EC tablet Take all 4 tablets at 8 am the morning prior to your surgery. 4 tablet 0  . erythromycin base (E-MYCIN) 500 MG tablet Take 2 tablets at 8am, 2 tablets at 2pm, and 2 tablets at 8pm the day prior to surgery. 6 tablet 0  . gabapentin (NEURONTIN) 300 MG capsule Take 1 capsule (300 mg total) by mouth 3 (three) times daily. 30 capsule 1  . ketorolac (TORADOL) 10 MG tablet Take 1 tablet (10 mg total) by mouth every 6 (six) hours as needed. 30 tablet 0  . neomycin (MYCIFRADIN) 500 MG tablet Take 2 tablet at 8am, take 2 tablets at 2pm, and take 2 tablets at 8pm the day prior to your surgery 6 tablet 0  . polyethylene glycol powder (MIRALAX) 17 GM/SCOOP powder Mix full container  in 64 ounces of Gatorade or other clear liquid. NO RED LIQUIDS 238 g 0   No current facility-administered medications for this visit.      Review of Systems Full ROS  was asked and was negative except for the information on the HPI  Physical Exam Blood pressure 110/62, pulse 98, temperature 97.7 F (36.5 C), height 6\' 1"  (1.854 m), weight 178 lb (80.7 kg). CONSTITUTIONAL: NAD EYES: Pupils are equal, round, and reactive to light, Sclera are non-icteric. EARS, NOSE, MOUTH AND THROAT: The oropharynx is clear. The oral mucosa is pink and moist. Hearing is intact to voice. LYMPH NODES:  Lymph nodes in the neck are normal. RESPIRATORY:  Lungs are clear. There is normal respiratory effort, with equal breath sounds bilaterally, and without  pathologic use of accessory muscles. CARDIOVASCULAR: Heart is regular without murmurs, gallops, or rubs. GI: The abdomen is soft, tender to palpation to the right lower quadrant but without peritonitis or rebound nondistended. There are no palpable masses. There is no hepatosplenomegaly. There are normal bowel sounds in all quadrants. GU: Rectal deferred.   MUSCULOSKELETAL: Normal muscle strength and tone. No cyanosis or edema.   SKIN: Turgor is good and there are no pathologic skin lesions or ulcers. NEUROLOGIC: Motor and sensation is grossly normal. Cranial nerves are grossly intact. PSYCH:  Oriented to person, place and time. Affect is normal.  Data Reviewed I have personally reviewed the patient's imaging, laboratory findings and medical records.    Assessment/Plan 38 year old male with right lower quadrant pain and a right apple core large colon mass.  Currently there is no evidence of obstruction or continued bleeding.  I had an extensive discussion regarding the situation with both the patient and his father.  Differential diagnosis will obviously include colon cancer versus inflammatory bowel disease.  Regardless of the etiology and given the fact that GI was unable to reach the cecum the next appropriate step is to perform a right hemicolectomy.  I do think that we will be able to accomplish this laparoscopically.  Procedure discussed with the patient in detail.  Risk, benefits and possible complications including but not limited to: Bleeding, infection, anastomotic leak, pain and reinterventions were discussed with the patient in detail.    I would like to also obtain a more recent CT scan of the abdomen pelvis to make sure there is no evidence of metastatic disease  we will schedule him for laparoscopic colectomy and we will give him a bowel prep the day before.  Extensive counseling provided.  A copy of this report was sent to the referring provider   A copy of this report was sent to  the referring provider   Caroleen Hamman, MD FACS General Surgeon 01/16/2019, 10:07 AM

## 2019-01-16 NOTE — H&P (View-Only) (Signed)
Patient ID: Tristan [name], male   DOB: 05-Feb-1981, 38 y.o.   MRN: [id number]  HPI Tristan [name] is a 38 y.o. male seen in consultation at the request of Dr. Vicente Males.  He has had chronic abdominal pain since 2018.  He reports that the pain is intermittent moderate intensity and sharp.  The pain is located in the right lower quadrant.  No specific alleviating or aggravating factors.  He has been seen by GI and work-up has been performed.  He has also received antibiotics without any changes.  He also had a history of diverticulitis at some point in time that was appropriately treated. Did have a attempted colonoscopy 2019 by Dr. Vicente Males but he was unable to reach the cecum. He consequently lost to follow-up.  Most recently he went to the emergency room where CT scan was performed.  I have personally reviewed the images showing a masslike within the cecum and ascending colon.  There is some inflammatory changes.Marland Kitchen  He then had a barium enema that I have also reviewed showing evidence of an apple core lesion within the cecum.   HPI  Past Medical History:  Diagnosis Date  . Asthma   . GERD (gastroesophageal reflux disease)     Past Surgical History:  Procedure Laterality Date  . COLONOSCOPY WITH PROPOFOL N/A 08/16/2016   Procedure: COLONOSCOPY WITH PROPOFOL;  Surgeon: [name], MD;  Location: [facility] ENDOSCOPY;  Service: Endoscopy;  Laterality: N/A;    Family History  Problem Relation Age of Onset  . Diabetes Mother   . Healthy Father   . Colon polyps Father   . Colon cancer Other   . Ulcerative colitis Paternal Aunt     Social History Social History   Tobacco Use  . Smoking status: Current Every Day Smoker    Packs/day: 0.75    Types: Cigarettes  . Smokeless tobacco: Never Used  Substance Use Topics  . Alcohol use: Not Currently  . Drug use: No    No Known Allergies  Current Outpatient Medications  Medication Sig Dispense Refill  . albuterol (PROVENTIL HFA;VENTOLIN HFA) 108 (90  Base) MCG/ACT inhaler Inhale 2 puffs into the lungs every 4 (four) hours as needed for wheezing. 1 Inhaler 0  . famotidine (PEPCID) 40 MG tablet Take 40 mg by mouth 2 (two) times daily.    Marland Kitchen HYDROcodone-acetaminophen (NORCO/VICODIN) 5-325 MG tablet Take 1 tablet by mouth every 4 (four) hours as needed. 15 tablet 0  . levocetirizine (XYZAL) 5 MG tablet Take 5 mg by mouth 2 (two) times daily.    . montelukast (SINGULAIR) 10 MG tablet Take 10 mg by mouth daily.    . bisacodyl (DULCOLAX) 5 MG EC tablet Take all 4 tablets at 8 am the morning prior to your surgery. 4 tablet 0  . erythromycin base (E-MYCIN) 500 MG tablet Take 2 tablets at 8am, 2 tablets at 2pm, and 2 tablets at 8pm the day prior to surgery. 6 tablet 0  . gabapentin (NEURONTIN) 300 MG capsule Take 1 capsule (300 mg total) by mouth 3 (three) times daily. 30 capsule 1  . ketorolac (TORADOL) 10 MG tablet Take 1 tablet (10 mg total) by mouth every 6 (six) hours as needed. 30 tablet 0  . neomycin (MYCIFRADIN) 500 MG tablet Take 2 tablet at 8am, take 2 tablets at 2pm, and take 2 tablets at 8pm the day prior to your surgery 6 tablet 0  . polyethylene glycol powder (MIRALAX) 17 GM/SCOOP powder Mix full container  in 64 ounces of Gatorade or other clear liquid. NO RED LIQUIDS 238 g 0   No current facility-administered medications for this visit.      Review of Systems Full ROS  was asked and was negative except for the information on the HPI  Physical Exam Blood pressure 110/62, pulse 98, temperature 97.7 F (36.5 C), height 6\' 1"  (1.854 m), weight 178 lb (80.7 kg). CONSTITUTIONAL: NAD EYES: Pupils are equal, round, and reactive to light, Sclera are non-icteric. EARS, NOSE, MOUTH AND THROAT: The oropharynx is clear. The oral mucosa is pink and moist. Hearing is intact to voice. LYMPH NODES:  Lymph nodes in the neck are normal. RESPIRATORY:  Lungs are clear. There is normal respiratory effort, with equal breath sounds bilaterally, and without  pathologic use of accessory muscles. CARDIOVASCULAR: Heart is regular without murmurs, gallops, or rubs. GI: The abdomen is soft, tender to palpation to the right lower quadrant but without peritonitis or rebound nondistended. There are no palpable masses. There is no hepatosplenomegaly. There are normal bowel sounds in all quadrants. GU: Rectal deferred.   MUSCULOSKELETAL: Normal muscle strength and tone. No cyanosis or edema.   SKIN: Turgor is good and there are no pathologic skin lesions or ulcers. NEUROLOGIC: Motor and sensation is grossly normal. Cranial nerves are grossly intact. PSYCH:  Oriented to person, place and time. Affect is normal.  Data Reviewed I have personally reviewed the patient's imaging, laboratory findings and medical records.    Assessment/Plan 38 year old male with right lower quadrant pain and a right apple core large colon mass.  Currently there is no evidence of obstruction or continued bleeding.  I had an extensive discussion regarding the situation with both the patient and his father.  Differential diagnosis will obviously include colon cancer versus inflammatory bowel disease.  Regardless of the etiology and given the fact that GI was unable to reach the cecum the next appropriate step is to perform a right hemicolectomy.  I do think that we will be able to accomplish this laparoscopically.  Procedure discussed with the patient in detail.  Risk, benefits and possible complications including but not limited to: Bleeding, infection, anastomotic leak, pain and reinterventions were discussed with the patient in detail.    I would like to also obtain a more recent CT scan of the abdomen pelvis to make sure there is no evidence of metastatic disease  we will schedule him for laparoscopic colectomy and we will give him a bowel prep the day before.  Extensive counseling provided.  A copy of this report was sent to the referring provider   A copy of this report was sent to  the referring provider   Caroleen Hamman, MD FACS General Surgeon 01/16/2019, 10:07 AM

## 2019-01-20 ENCOUNTER — Other Ambulatory Visit: Payer: Self-pay

## 2019-01-20 ENCOUNTER — Telehealth: Payer: Self-pay

## 2019-01-20 ENCOUNTER — Telehealth: Payer: Self-pay | Admitting: Surgery

## 2019-01-20 DIAGNOSIS — R933 Abnormal findings on diagnostic imaging of other parts of digestive tract: Secondary | ICD-10-CM

## 2019-01-20 NOTE — Progress Notes (Signed)
Tristan [name] or Tristan [name]   This patient has been lost to follow-up in the past as he did not show up for prior appointments.  His evaluation is in fact overdue.  I have discussed with Dr. Janese Banks who is on-call today and felt it would be best to refer to her to follow-up from their side after he undergoes the surgery on 01/30/2019.  Can you make a referral please reason for referral mass seen on imaging in the right colon concerning for neoplasm  Dr [name] MD,MRCP [facility]) Gastroenterology/Hepatology Pager: [phone]

## 2019-01-20 NOTE — Telephone Encounter (Signed)
Patient is calling said he was in a lot of pain and has been taken his pain medicine like he suppose to, he is asking if there is anything he can do or take so he can make it through the day so hes not in so much pain to last him until surgery, pain level when he sits down is at a 1 or 2, but when he stands his pain is at an 8 and when walking it runs to about an 8 to a 9. Please call patient and advise.

## 2019-01-20 NOTE — Telephone Encounter (Signed)
Spoke with pt and informed him of Dr. Georgeann Oppenheim recommendation for pt to follow up with Dr. Janese Banks in oncology for evaluation of colon mass after his procedure with Dr. Dahlia Byes. I explained that we will send the referral to [facility]. Pt understands and agrees.

## 2019-01-20 NOTE — Telephone Encounter (Signed)
Per Dr Dahlia Byes he will not prescribing any more pain medication. He may go to the ER if the pain gets too bad. He understands. Patient instructed that he may use two extra strength Tylenol as instructed at well as his current medication. He will give this a try.

## 2019-01-20 NOTE — Telephone Encounter (Signed)
-----   Message from [name], MD sent at 01/20/2019 11:57 AM EST -----   ----- Message ----- From: Jules Husbands, MD Sent: 01/16/2019  10:20 AM EST To: No Pcp Per Patient, [name], MD

## 2019-01-21 ENCOUNTER — Telehealth: Payer: Self-pay | Admitting: Surgery

## 2019-01-21 ENCOUNTER — Ambulatory Visit
Admission: RE | Admit: 2019-01-21 | Discharge: 2019-01-21 | Disposition: A | Discharge status: Home / Self Care | Payer: Self-pay | Source: Ambulatory Visit | Attending: Surgery | Admitting: Surgery

## 2019-01-21 ENCOUNTER — Other Ambulatory Visit: Payer: Self-pay

## 2019-01-21 DIAGNOSIS — K6389 Other specified diseases of intestine: Secondary | ICD-10-CM | POA: Insufficient documentation

## 2019-01-21 MED ORDER — IOHEXOL 300 MG/ML  SOLN
100.0000 mL | Freq: Once | INTRAMUSCULAR | Status: AC | PRN
  Administered 2019-01-21: 100 mL via INTRAVENOUS

## 2019-01-21 NOTE — Telephone Encounter (Signed)
I called this patient regarding his recent CT results, explaining his need for IR drainage.  Advised he be admitted this evening so we can expedite his care tomorrow and start him on antibiotics.  He has an overwhelming concern about touching base with his employer, and if by all means remaining an outpatient.  I finally got him to agree to presenting to the office first thing in the morning and we will attempt to facilitate his care as able.

## 2019-01-22 ENCOUNTER — Encounter: Payer: Self-pay | Admitting: Surgery

## 2019-01-22 ENCOUNTER — Ambulatory Visit (INDEPENDENT_AMBULATORY_CARE_PROVIDER_SITE_OTHER): Payer: Self-pay | Admitting: Surgery

## 2019-01-22 ENCOUNTER — Other Ambulatory Visit: Payer: Self-pay

## 2019-01-22 VITALS — BP 110/72 | HR 112 | Temp 97.7°F | Resp 15 | Ht 73.0 in | Wt 175.4 lb

## 2019-01-22 DIAGNOSIS — K6389 Other specified diseases of intestine: Secondary | ICD-10-CM

## 2019-01-22 DIAGNOSIS — R935 Abnormal findings on diagnostic imaging of other abdominal regions, including retroperitoneum: Secondary | ICD-10-CM

## 2019-01-22 MED ORDER — GABAPENTIN 300 MG PO CAPS: 300.0000 mg | ORAL_CAPSULE | Freq: Three times a day (TID) | ORAL | 1 refills | Status: DC

## 2019-01-22 MED ORDER — AMOXICILLIN-POT CLAVULANATE 875-125 MG PO TABS: 1.0000 | ORAL_TABLET | Freq: Two times a day (BID) | ORAL | 0 refills | Status: AC

## 2019-01-22 MED ORDER — KETOROLAC TROMETHAMINE 10 MG PO TABS: 10.0000 mg | ORAL_TABLET | Freq: Four times a day (QID) | ORAL | 0 refills | Status: DC | PRN

## 2019-01-22 NOTE — Patient Instructions (Signed)
   Follow-up with our office as needed.  Please call and ask to speak with a nurse if you develop questions or concerns.  

## 2019-01-22 NOTE — Progress Notes (Signed)
Patient ID: Tristan [name], male   DOB: 08/15/1980, 38 y.o.   MRN: [id number]  Chief Complaint: Follow-up for CT results  History of Present Illness Tristan [name] is a 38 y.o. male with follow up visit requested .  He reports continued tightness of his abdomen, with pain inadequately relieved with gabapentin.  He reports night sweats but has known known fevers.  He continues to have diarrhea.  Past Medical History Past Medical History:  Diagnosis Date  . Asthma   . GERD (gastroesophageal reflux disease)      Past Surgical History:  Procedure Laterality Date  . COLONOSCOPY WITH PROPOFOL N/A 08/16/2016   Procedure: COLONOSCOPY WITH PROPOFOL;  Surgeon: [name], MD;  Location: [facility] ENDOSCOPY;  Service: Endoscopy;  Laterality: N/A;    No Known Allergies  Current Outpatient Medications  Medication Sig Dispense Refill  . albuterol (PROVENTIL HFA;VENTOLIN HFA) 108 (90 Base) MCG/ACT inhaler Inhale 2 puffs into the lungs every 4 (four) hours as needed for wheezing. 1 Inhaler 0  . bisacodyl (DULCOLAX) 5 MG EC tablet Take all 4 tablets at 8 am the morning prior to your surgery. 4 tablet 0  . calcium carbonate (TUMS - DOSED IN MG ELEMENTAL CALCIUM) 500 MG chewable tablet Chew 2 tablets by mouth 2 (two) times daily as needed for indigestion or heartburn.    . erythromycin base (E-MYCIN) 500 MG tablet Take 2 tablets at 8am, 2 tablets at 2pm, and 2 tablets at 8pm the day prior to surgery. 6 tablet 0  . HYDROcodone-acetaminophen (NORCO/VICODIN) 5-325 MG tablet Take 1 tablet by mouth every 4 (four) hours as needed. 15 tablet 0  . neomycin (MYCIFRADIN) 500 MG tablet Take 2 tablet at 8am, take 2 tablets at 2pm, and take 2 tablets at 8pm the day prior to your surgery 6 tablet 0  . polyethylene glycol powder (MIRALAX) 17 GM/SCOOP powder Mix full container in 64 ounces of Gatorade or other clear liquid. NO RED LIQUIDS 238 g 0  . amoxicillin-clavulanate (AUGMENTIN) 875-125 MG tablet Take 1 tablet by mouth  2 (two) times daily for 7 days. 14 tablet 0  . gabapentin (NEURONTIN) 300 MG capsule Take 1 capsule (300 mg total) by mouth 3 (three) times daily. 30 capsule 1  . ketorolac (TORADOL) 10 MG tablet Take 1 tablet (10 mg total) by mouth every 6 (six) hours as needed. 30 tablet 0   No current facility-administered medications for this visit.     Family History Family History  Problem Relation Age of Onset  . Diabetes Mother   . Healthy Father   . Colon polyps Father   . Colon cancer Other   . Ulcerative colitis Paternal Aunt       Social History Social History   Tobacco Use  . Smoking status: Current Every Day Smoker    Packs/day: 0.75    Types: Cigarettes  . Smokeless tobacco: Never Used  Substance Use Topics  . Alcohol use: Not Currently  . Drug use: No      Review of Systems  Constitutional: Positive for diaphoresis and malaise/fatigue. Negative for chills and fever.  Respiratory: Negative.   Cardiovascular: Negative.   Gastrointestinal: Positive for nausea. Negative for blood in stool, constipation and vomiting.  Skin: Negative for itching and rash.     Physical Exam Blood pressure 110/72, pulse (!) 112, temperature 97.7 F (36.5 C), resp. rate 15, height 6\' 1"  (1.854 m), weight 175 lb 6.4 oz (79.6 kg), SpO2 95 %.  CONSTITUTIONAL: Well  developed, no acute distress. EYES: Sclera non-icteric. EARS, NOSE, MOUTH AND THROAT: The oropharynx is clear. Oral mucosa is pink and moist. Hearing is intact to voice.  NECK: Trachea is midline, and there is no jugular venous distension.  Lymph nodes in the neck are not enlarged. RESPIRATORY:  Lungs are clear, and breath sounds are equal bilaterally. Normal respiratory effort without pathologic use of accessory muscles. CARDIOVASCULAR: Heart is regular. GI: The abdomen is diffusely firm, tenderness/rigidity of RLQ, and nondistended. There were no palpable masses. There were normal bowel sounds. MUSCULOSKELETAL:  Normal muscle  strength and tone in all four extremities.    SKIN: Skin turgor is normal. There are no pathologic skin lesions.  NEUROLOGIC:  Motor and sensation is grossly normal.  Cranial nerves are grossly intact. PSYCH:  Alert and oriented to person, place and time. Affect is normal.  Data Reviewed CT images reviewed with patient.   I have personally reviewed the patient's imaging and medical records.    Assessment    Right ileo-colic mass.  Adjacent fluid collections and inflammatory changes of appendix and adjacent bowel.   Plan    [place] has a way of making this a much more difficult situation.  I explained the ideal scenario would be to admit him today obtain percutaneous drainage and give him good IV antibiotics.  He has his mind so set on his future surgery and apparently its timing in relation to his work that he is not willing to adjust his schedule/plans for my proposed care.  He is very concerned about his work and maintaining his position.  I discussed with him in detail my concerns that he will continue to feel worse over the next few days.  I feel like my arm was bent to compromise and offer him further outpatient treatment with antibiotics, and refills of his Toradol and gabapentin.  But this is what we agreed to.  Face-to-face time spent with the patient and care providers was 45 minutes, with more than 50% of the time spent counseling, educating, and coordinating care of the patient.      [employer]....? Yes 01/22/2019, 9:27 AM

## 2019-01-26 ENCOUNTER — Encounter
Admission: RE | Admit: 2019-01-26 | Discharge: 2019-01-26 | Disposition: A | Discharge status: Home / Self Care | Payer: Self-pay | Source: Ambulatory Visit | Attending: Surgery | Admitting: Surgery

## 2019-01-26 ENCOUNTER — Telehealth: Payer: Self-pay

## 2019-01-26 ENCOUNTER — Other Ambulatory Visit: Payer: Self-pay

## 2019-01-26 HISTORY — DX: Abnormal weight loss: R63.4

## 2019-01-26 HISTORY — DX: Unspecified abdominal pain: R10.9

## 2019-01-26 HISTORY — DX: Anorexia: R63.0

## 2019-01-26 HISTORY — DX: Nausea: R11.0

## 2019-01-26 NOTE — Telephone Encounter (Signed)
Patient states he hasn't had much of an appetite. He had a protein shake.  He states he had applesauce and jello yesterday. He states he is drinking plenty of fluids. He stats he has diarrhea since all this started. He is urinating 5-6 times daily. He was instructed to continue to drink fluids.

## 2019-01-26 NOTE — Patient Instructions (Signed)
Your procedure is scheduled on: 01/30/2019 Fri Report to Same Day Surgery 2nd floor medical mall [facility] Entrance-take elevator on left to 2nd floor.  Check in with surgery information desk.) To find out your arrival time please call [phone] between 1PM - 3PM on 01/29/2019 Thurs  Remember: Instructions that are not followed completely may result in serious medical risk, up to and including death, or upon the discretion of your surgeon and anesthesiologist your surgery may need to be rescheduled.    _x___ 1. Do not eat food after midnight the night before your procedure. You may drink clear liquids up to 2 hours before you are scheduled to arrive at the hospital for your procedure.  Do not drink clear liquids within 2 hours of your scheduled arrival to the hospital.  Clear liquids include  --Water or Apple juice without pulp  --Clear carbohydrate beverage such as ClearFast or Gatorade  --Black Coffee or Clear Tea (No milk, no creamers, do not add anything to                  the coffee or Tea Type 1 and type 2 diabetics should only drink water.   ____Ensure clear carbohydrate drink on the way to the hospital for bariatric patients  ____Ensure clear carbohydrate drink 3 hours before surgery.   No gum chewing or hard candies.     __x__ 2. No Alcohol for 24 hours before or after surgery.   __x__3. No Smoking or e-cigarettes for 24 prior to surgery.  Do not use any chewable tobacco products for at least 6 hour prior to surgery   ____  4. Bring all medications with you on the day of surgery if instructed.    __x__ 5. Notify your doctor if there is any change in your medical condition     (cold, fever, infections).    x___6. On the morning of surgery brush your teeth with toothpaste and water.  You may rinse your mouth with mouth wash if you wish.  Do not swallow any toothpaste or mouthwash.   Do not wear jewelry, make-up, hairpins, clips or nail polish.  Do not wear lotions,  powders, or perfumes. You may wear deodorant.  Do not shave 48 hours prior to surgery. Men may shave face and neck.  Do not bring valuables to the hospital.    [facility] is not responsible for any belongings or valuables.               Contacts, dentures or bridgework may not be worn into surgery.  Leave your suitcase in the car. After surgery it may be brought to your room.  For patients admitted to the hospital, discharge time is determined by your                       treatment team.  _  Patients discharged the day of surgery will not be allowed to drive home.  You will need someone to drive you home and stay with you the night of your procedure.    Please read over the following fact sheets that you were given:   [facility] Preparing for Surgery and or MRSA Information   _x___ Take anti-hypertensive listed below, cardiac, seizure, asthma,     anti-reflux and psychiatric medicines. These include:  1. Medications per surgeon instructions  2.gabapentin (NEURONTIN) 300 MG capsule  3.ketorolac (TORADOL) 10 MG tablet if needed  4.  5.  6.  ____Fleets  enema or Magnesium Citrate as directed.   _x___ Use CHG Soap or sage wipes as directed on instruction sheet   ____ Use inhalers on the day of surgery and bring to hospital day of surgery  ____ Stop Metformin and Janumet 2 days prior to surgery.    ____ Take 1/2 of usual insulin dose the night before surgery and none on the morning     surgery.   _x___ Follow recommendations from Cardiologist, Pulmonologist or PCP regarding          stopping Aspirin, Coumadin, Plavix ,Eliquis, Effient, or Pradaxa, and Pletal.  X____Stop Anti-inflammatories such as Advil, Aleve, Ibuprofen, Motrin, Naproxen, Naprosyn, Goodies powders or aspirin products. OK to take Tylenol and                          Celebrex.   _x___ Stop supplements until after surgery.  But may continue Vitamin D, Vitamin B,       and multivitamin.   ____ Bring C-Pap to the  hospital.

## 2019-01-27 ENCOUNTER — Other Ambulatory Visit: Payer: Self-pay

## 2019-01-27 ENCOUNTER — Other Ambulatory Visit
Admission: RE | Admit: 2019-01-27 | Discharge: 2019-01-27 | Disposition: A | Discharge status: Home / Self Care | Payer: PRIVATE HEALTH INSURANCE | Source: Ambulatory Visit | Attending: Surgery | Admitting: Surgery

## 2019-01-27 DIAGNOSIS — Z20828 Contact with and (suspected) exposure to other viral communicable diseases: Secondary | ICD-10-CM | POA: Insufficient documentation

## 2019-01-27 DIAGNOSIS — Z01812 Encounter for preprocedural laboratory examination: Secondary | ICD-10-CM | POA: Insufficient documentation

## 2019-01-27 LAB — SARS CORONAVIRUS 2 (TAT 6-24 HRS): SARS Coronavirus 2: NEGATIVE

## 2019-01-28 ENCOUNTER — Other Ambulatory Visit: Payer: Self-pay

## 2019-01-28 DIAGNOSIS — K6389 Other specified diseases of intestine: Secondary | ICD-10-CM

## 2019-01-28 DIAGNOSIS — R933 Abnormal findings on diagnostic imaging of other parts of digestive tract: Secondary | ICD-10-CM

## 2019-01-29 ENCOUNTER — Telehealth: Payer: Self-pay

## 2019-01-29 NOTE — Telephone Encounter (Signed)
Please call [phone] to get pre-notification for surgery tomorrow.

## 2019-01-30 ENCOUNTER — Other Ambulatory Visit: Payer: Self-pay

## 2019-01-30 ENCOUNTER — Encounter
Admission: RE | Disposition: A | Discharge status: Home Health | Payer: Self-pay | Source: Home / Self Care | Attending: Surgery

## 2019-01-30 ENCOUNTER — Inpatient Hospital Stay
Admission: RE | Admit: 2019-01-30 | Discharge: 2019-02-08 | DRG: 329 | Disposition: A | Discharge status: Home Health | Payer: PRIVATE HEALTH INSURANCE | Attending: Surgery | Admitting: Surgery

## 2019-01-30 ENCOUNTER — Ambulatory Visit: Payer: PRIVATE HEALTH INSURANCE | Admitting: Certified Registered Nurse Anesthetist

## 2019-01-30 DIAGNOSIS — J45909 Unspecified asthma, uncomplicated: Secondary | ICD-10-CM | POA: Diagnosis present

## 2019-01-30 DIAGNOSIS — B954 Other streptococcus as the cause of diseases classified elsewhere: Secondary | ICD-10-CM | POA: Diagnosis present

## 2019-01-30 DIAGNOSIS — K429 Umbilical hernia without obstruction or gangrene: Secondary | ICD-10-CM | POA: Diagnosis present

## 2019-01-30 DIAGNOSIS — R6510 Systemic inflammatory response syndrome (SIRS) of non-infectious origin without acute organ dysfunction: Secondary | ICD-10-CM | POA: Diagnosis not present

## 2019-01-30 DIAGNOSIS — Z5331 Laparoscopic surgical procedure converted to open procedure: Secondary | ICD-10-CM

## 2019-01-30 DIAGNOSIS — B9620 Unspecified Escherichia coli [E. coli] as the cause of diseases classified elsewhere: Secondary | ICD-10-CM | POA: Diagnosis present

## 2019-01-30 DIAGNOSIS — K651 Peritoneal abscess: Secondary | ICD-10-CM | POA: Diagnosis present

## 2019-01-30 DIAGNOSIS — C182 Malignant neoplasm of ascending colon: Secondary | ICD-10-CM | POA: Diagnosis present

## 2019-01-30 DIAGNOSIS — R935 Abnormal findings on diagnostic imaging of other abdominal regions, including retroperitoneum: Secondary | ICD-10-CM

## 2019-01-30 DIAGNOSIS — K219 Gastro-esophageal reflux disease without esophagitis: Secondary | ICD-10-CM | POA: Diagnosis present

## 2019-01-30 DIAGNOSIS — F17210 Nicotine dependence, cigarettes, uncomplicated: Secondary | ICD-10-CM | POA: Diagnosis present

## 2019-01-30 DIAGNOSIS — D638 Anemia in other chronic diseases classified elsewhere: Secondary | ICD-10-CM | POA: Diagnosis present

## 2019-01-30 DIAGNOSIS — D62 Acute posthemorrhagic anemia: Secondary | ICD-10-CM | POA: Diagnosis not present

## 2019-01-30 DIAGNOSIS — Z20828 Contact with and (suspected) exposure to other viral communicable diseases: Secondary | ICD-10-CM | POA: Diagnosis present

## 2019-01-30 DIAGNOSIS — K6389 Other specified diseases of intestine: Secondary | ICD-10-CM | POA: Diagnosis not present

## 2019-01-30 DIAGNOSIS — E43 Unspecified severe protein-calorie malnutrition: Secondary | ICD-10-CM | POA: Insufficient documentation

## 2019-01-30 DIAGNOSIS — Z79899 Other long term (current) drug therapy: Secondary | ICD-10-CM

## 2019-01-30 DIAGNOSIS — N179 Acute kidney failure, unspecified: Secondary | ICD-10-CM | POA: Diagnosis not present

## 2019-01-30 DIAGNOSIS — R188 Other ascites: Secondary | ICD-10-CM | POA: Diagnosis present

## 2019-01-30 HISTORY — PX: COLONOSCOPY: SHX5424

## 2019-01-30 HISTORY — PX: COLONOSCOPY WITH PROPOFOL: SHX5780

## 2019-01-30 HISTORY — PX: LAPAROSCOPIC RIGHT COLECTOMY: SHX5925

## 2019-01-30 LAB — CBC
HCT: 28 % — ABNORMAL LOW (ref 39.0–52.0)
Hemoglobin: 8.8 g/dL — ABNORMAL LOW (ref 13.0–17.0)
MCH: 21.2 pg — ABNORMAL LOW (ref 26.0–34.0)
MCHC: 31.4 g/dL (ref 30.0–36.0)
MCV: 67.5 fL — ABNORMAL LOW (ref 80.0–100.0)
Platelets: 440 10*3/uL — ABNORMAL HIGH (ref 150–400)
RBC: 4.15 MIL/uL — ABNORMAL LOW (ref 4.22–5.81)
RDW: 18.3 % — ABNORMAL HIGH (ref 11.5–15.5)
WBC: 14.3 10*3/uL — ABNORMAL HIGH (ref 4.0–10.5)
nRBC: 0 % (ref 0.0–0.2)

## 2019-01-30 LAB — GRAM STAIN

## 2019-01-30 LAB — CREATININE, SERUM
Creatinine, Ser: 1.05 mg/dL (ref 0.61–1.24)
GFR calc Af Amer: >60 mL/min (ref >60)
GFR calc non Af Amer: >60 mL/min (ref >60)

## 2019-01-30 LAB — HIV ANTIBODY (ROUTINE TESTING W REFLEX): HIV Screen 4th Generation wRfx: NONREACTIVE

## 2019-01-30 LAB — ABO/RH: ABO/RH(D): O POS

## 2019-01-30 SURGERY — COLECTOMY, RIGHT, LAPAROSCOPIC
Anesthesia: General

## 2019-01-30 SURGERY — COLONOSCOPY WITH PROPOFOL
Anesthesia: General

## 2019-01-30 MED ORDER — PIPERACILLIN-TAZOBACTAM 3.375 G IVPB
INTRAVENOUS | Status: AC
  Administered 2019-01-30: 3.375 g
  Filled 2019-01-30: qty 50

## 2019-01-30 MED ORDER — HYDROMORPHONE HCL 1 MG/ML IJ SOLN
INTRAMUSCULAR | Status: DC | PRN
  Administered 2019-01-30 (×4): 0.5 mg via INTRAVENOUS

## 2019-01-30 MED ORDER — FENTANYL CITRATE (PF) 250 MCG/5ML IJ SOLN
INTRAMUSCULAR | Status: DC | PRN
  Administered 2019-01-30 (×2): 50 ug via INTRAVENOUS
  Administered 2019-01-30: 100 ug via INTRAVENOUS

## 2019-01-30 MED ORDER — VANCOMYCIN HCL 1.25 G IV SOLR
1250.0000 mg | Freq: Three times a day (TID) | INTRAVENOUS | Status: DC
  Administered 2019-01-30 – 2019-01-31 (×3): 1250 mg via INTRAVENOUS
  Filled 2019-01-30 (×6): qty 1250

## 2019-01-30 MED ORDER — HEPARIN SODIUM (PORCINE) 5000 UNIT/ML IJ SOLN
INTRAMUSCULAR | Status: AC
  Administered 2019-01-30: 5000 [IU] via SUBCUTANEOUS
  Filled 2019-01-30: qty 1

## 2019-01-30 MED ORDER — KETOROLAC TROMETHAMINE 30 MG/ML IJ SOLN
INTRAMUSCULAR | Status: AC
  Filled 2019-01-30: qty 1

## 2019-01-30 MED ORDER — KETAMINE HCL 50 MG/ML IJ SOLN
INTRAMUSCULAR | Status: AC
  Filled 2019-01-30: qty 10

## 2019-01-30 MED ORDER — KETAMINE HCL 50 MG/ML IJ SOLN
INTRAMUSCULAR | Status: DC | PRN
  Administered 2019-01-30 (×2): 25 mg via INTRAVENOUS

## 2019-01-30 MED ORDER — ONDANSETRON HCL 4 MG/2ML IJ SOLN
INTRAMUSCULAR | Status: DC | PRN
  Administered 2019-01-30: 4 mg via INTRAVENOUS

## 2019-01-30 MED ORDER — CELECOXIB 200 MG PO CAPS
ORAL_CAPSULE | ORAL | Status: AC
  Administered 2019-01-30: 200 mg via ORAL
  Filled 2019-01-30: qty 1

## 2019-01-30 MED ORDER — HEPARIN SODIUM (PORCINE) 5000 UNIT/ML IJ SOLN
5000.0000 [IU] | Freq: Once | INTRAMUSCULAR | Status: AC
  Administered 2019-01-30: 07:00:00 5000 [IU] via SUBCUTANEOUS

## 2019-01-30 MED ORDER — OXYCODONE HCL 5 MG PO TABS
5.0000 mg | ORAL_TABLET | ORAL | Status: DC | PRN
  Administered 2019-02-02 (×3): 5 mg via ORAL
  Administered 2019-02-03 – 2019-02-08 (×13): 10 mg via ORAL
  Filled 2019-01-30 (×4): qty 2
  Filled 2019-01-30: qty 1
  Filled 2019-01-30 (×3): qty 2
  Filled 2019-01-30: qty 1
  Filled 2019-01-30 (×2): qty 2
  Filled 2019-01-30: qty 1
  Filled 2019-01-30 (×5): qty 2

## 2019-01-30 MED ORDER — LACTATED RINGERS IV SOLN
INTRAVENOUS | Status: DC
  Administered 2019-01-30: 20 mL/h via INTRAVENOUS
  Administered 2019-01-30 (×2): via INTRAVENOUS

## 2019-01-30 MED ORDER — CHLORHEXIDINE GLUCONATE CLOTH 2 % EX PADS: 6.0000 | MEDICATED_PAD | Freq: Once | CUTANEOUS | Status: DC

## 2019-01-30 MED ORDER — ROCURONIUM BROMIDE 50 MG/5ML IV SOLN
INTRAVENOUS | Status: AC
  Filled 2019-01-30: qty 1

## 2019-01-30 MED ORDER — ROCURONIUM BROMIDE 100 MG/10ML IV SOLN
INTRAVENOUS | Status: DC | PRN
  Administered 2019-01-30 (×2): 50 mg via INTRAVENOUS

## 2019-01-30 MED ORDER — DEXAMETHASONE SODIUM PHOSPHATE 10 MG/ML IJ SOLN
INTRAMUSCULAR | Status: DC | PRN
  Administered 2019-01-30: 10 mg via INTRAVENOUS

## 2019-01-30 MED ORDER — OXYCODONE HCL 5 MG/5ML PO SOLN: 5.0000 mg | Freq: Once | ORAL | Status: DC | PRN

## 2019-01-30 MED ORDER — CELECOXIB 200 MG PO CAPS
200.0000 mg | ORAL_CAPSULE | ORAL | Status: AC
  Administered 2019-01-30: 07:00:00 200 mg via ORAL

## 2019-01-30 MED ORDER — BUPIVACAINE-EPINEPHRINE (PF) 0.5% -1:200000 IJ SOLN
INTRAMUSCULAR | Status: DC | PRN
  Administered 2019-01-30: 30 mL

## 2019-01-30 MED ORDER — SODIUM CHLORIDE 0.9 % IV SOLN
INTRAVENOUS | Status: DC
  Administered 2019-01-30 – 2019-02-07 (×9): via INTRAVENOUS

## 2019-01-30 MED ORDER — VANCOMYCIN HCL 10 G IV SOLR: 1250.0000 mg | Freq: Three times a day (TID) | INTRAVENOUS | Status: DC

## 2019-01-30 MED ORDER — PROCHLORPERAZINE EDISYLATE 10 MG/2ML IJ SOLN
5.0000 mg | Freq: Four times a day (QID) | INTRAMUSCULAR | Status: DC | PRN
  Administered 2019-02-05: 5 mg via INTRAVENOUS
  Filled 2019-01-30: qty 1
  Filled 2019-01-30 (×2): qty 2

## 2019-01-30 MED ORDER — BUPIVACAINE LIPOSOME 1.3 % IJ SUSP
INTRAMUSCULAR | Status: AC
  Filled 2019-01-30: qty 20

## 2019-01-30 MED ORDER — GLYCOPYRROLATE 0.2 MG/ML IJ SOLN
INTRAMUSCULAR | Status: AC
  Filled 2019-01-30: qty 1

## 2019-01-30 MED ORDER — PROMETHAZINE HCL 25 MG/ML IJ SOLN: 6.2500 mg | INTRAMUSCULAR | Status: DC | PRN

## 2019-01-30 MED ORDER — SUGAMMADEX SODIUM 500 MG/5ML IV SOLN
INTRAVENOUS | Status: DC | PRN
  Administered 2019-01-30: 300 mg via INTRAVENOUS

## 2019-01-30 MED ORDER — PROPOFOL 10 MG/ML IV BOLUS
INTRAVENOUS | Status: DC | PRN
  Administered 2019-01-30: 160 mg via INTRAVENOUS

## 2019-01-30 MED ORDER — GABAPENTIN 300 MG PO CAPS
300.0000 mg | ORAL_CAPSULE | ORAL | Status: AC
  Administered 2019-01-30: 07:00:00 300 mg via ORAL

## 2019-01-30 MED ORDER — HYDRALAZINE HCL 20 MG/ML IJ SOLN: 10.0000 mg | INTRAMUSCULAR | Status: DC | PRN

## 2019-01-30 MED ORDER — EPINEPHRINE PF 1 MG/ML IJ SOLN
INTRAMUSCULAR | Status: AC
  Filled 2019-01-30: qty 1

## 2019-01-30 MED ORDER — PIPERACILLIN-TAZOBACTAM 3.375 G IVPB
3.3750 g | Freq: Three times a day (TID) | INTRAVENOUS | Status: DC
  Administered 2019-01-30 – 2019-02-08 (×26): 3.375 g via INTRAVENOUS
  Filled 2019-01-30 (×26): qty 50

## 2019-01-30 MED ORDER — MEPERIDINE HCL 50 MG/ML IJ SOLN: 6.2500 mg | INTRAMUSCULAR | Status: DC | PRN

## 2019-01-30 MED ORDER — BUPIVACAINE HCL (PF) 0.5 % IJ SOLN
INTRAMUSCULAR | Status: AC
  Filled 2019-01-30: qty 30

## 2019-01-30 MED ORDER — SUGAMMADEX SODIUM 500 MG/5ML IV SOLN
INTRAVENOUS | Status: AC
  Filled 2019-01-30: qty 5

## 2019-01-30 MED ORDER — MORPHINE SULFATE (PF) 4 MG/ML IV SOLN
4.0000 mg | INTRAVENOUS | Status: DC | PRN
  Administered 2019-01-30 – 2019-02-08 (×20): 4 mg via INTRAVENOUS
  Filled 2019-01-30 (×21): qty 1

## 2019-01-30 MED ORDER — ONDANSETRON HCL 4 MG/2ML IJ SOLN
INTRAMUSCULAR | Status: AC
  Filled 2019-01-30: qty 2

## 2019-01-30 MED ORDER — FENTANYL CITRATE (PF) 100 MCG/2ML IJ SOLN: 25.0000 ug | INTRAMUSCULAR | Status: DC | PRN

## 2019-01-30 MED ORDER — KETOROLAC TROMETHAMINE 30 MG/ML IJ SOLN
30.0000 mg | Freq: Four times a day (QID) | INTRAMUSCULAR | Status: AC
  Administered 2019-01-30 – 2019-02-04 (×20): 30 mg via INTRAVENOUS
  Filled 2019-01-30 (×20): qty 1

## 2019-01-30 MED ORDER — ENOXAPARIN SODIUM 40 MG/0.4ML ~~LOC~~ SOLN
40.0000 mg | SUBCUTANEOUS | Status: DC
  Administered 2019-01-31 – 2019-02-02 (×3): 40 mg via SUBCUTANEOUS
  Filled 2019-01-30 (×3): qty 0.4

## 2019-01-30 MED ORDER — ACETAMINOPHEN 500 MG PO TABS
1000.0000 mg | ORAL_TABLET | ORAL | Status: AC
  Administered 2019-01-30: 07:00:00 1000 mg via ORAL

## 2019-01-30 MED ORDER — CHLORHEXIDINE GLUCONATE CLOTH 2 % EX PADS
6.0000 | MEDICATED_PAD | Freq: Every day | CUTANEOUS | Status: DC
  Administered 2019-01-31 (×2): 6 via TOPICAL

## 2019-01-30 MED ORDER — PANTOPRAZOLE SODIUM 40 MG IV SOLR
40.0000 mg | Freq: Two times a day (BID) | INTRAVENOUS | Status: DC
  Administered 2019-01-30 – 2019-02-08 (×18): 40 mg via INTRAVENOUS
  Filled 2019-01-30 (×18): qty 40

## 2019-01-30 MED ORDER — ACETAMINOPHEN 10 MG/ML IV SOLN
1000.0000 mg | Freq: Four times a day (QID) | INTRAVENOUS | Status: AC
  Administered 2019-01-30 – 2019-01-31 (×4): 1000 mg via INTRAVENOUS
  Filled 2019-01-30 (×4): qty 100

## 2019-01-30 MED ORDER — LIDOCAINE HCL (PF) 2 % IJ SOLN
INTRAMUSCULAR | Status: AC
  Filled 2019-01-30: qty 10

## 2019-01-30 MED ORDER — CHLORHEXIDINE GLUCONATE CLOTH 2 % EX PADS
6.0000 | MEDICATED_PAD | Freq: Once | CUTANEOUS | Status: AC
  Administered 2019-01-30: 6 via TOPICAL

## 2019-01-30 MED ORDER — ONDANSETRON 4 MG PO TBDP
4.0000 mg | ORAL_TABLET | Freq: Four times a day (QID) | ORAL | Status: DC | PRN
  Administered 2019-02-03: 4 mg via ORAL
  Filled 2019-01-30: qty 1

## 2019-01-30 MED ORDER — DEXAMETHASONE SODIUM PHOSPHATE 10 MG/ML IJ SOLN
INTRAMUSCULAR | Status: AC
  Filled 2019-01-30: qty 1

## 2019-01-30 MED ORDER — SODIUM CHLORIDE 0.9 % IV SOLN
2.0000 g | INTRAVENOUS | Status: AC
  Administered 2019-01-30: 2 g via INTRAVENOUS
  Filled 2019-01-30: qty 2

## 2019-01-30 MED ORDER — VANCOMYCIN HCL 1.5 G IV SOLR
1500.0000 mg | Freq: Once | INTRAVENOUS | Status: AC
  Administered 2019-01-30: 1500 mg via INTRAVENOUS
  Filled 2019-01-30: qty 1500

## 2019-01-30 MED ORDER — FAMOTIDINE 20 MG PO TABS
20.0000 mg | ORAL_TABLET | Freq: Once | ORAL | Status: AC
  Administered 2019-01-30: 07:00:00 20 mg via ORAL

## 2019-01-30 MED ORDER — PROPOFOL 10 MG/ML IV BOLUS
INTRAVENOUS | Status: AC
  Filled 2019-01-30: qty 20

## 2019-01-30 MED ORDER — PHENYLEPHRINE HCL (PRESSORS) 10 MG/ML IV SOLN
INTRAVENOUS | Status: AC
  Filled 2019-01-30: qty 1

## 2019-01-30 MED ORDER — MIDAZOLAM HCL 2 MG/2ML IJ SOLN
INTRAMUSCULAR | Status: DC | PRN
  Administered 2019-01-30: 2 mg via INTRAVENOUS

## 2019-01-30 MED ORDER — FENTANYL CITRATE (PF) 250 MCG/5ML IJ SOLN
INTRAMUSCULAR | Status: AC
  Filled 2019-01-30: qty 5

## 2019-01-30 MED ORDER — SUGAMMADEX SODIUM 200 MG/2ML IV SOLN
INTRAVENOUS | Status: AC
  Filled 2019-01-30: qty 2

## 2019-01-30 MED ORDER — PHENOL 1.4 % MT LIQD
1.0000 | OROMUCOSAL | Status: DC | PRN
  Filled 2019-01-30 (×2): qty 177

## 2019-01-30 MED ORDER — VANCOMYCIN HCL 1000 MG IV SOLR
INTRAVENOUS | Status: DC | PRN
  Administered 2019-01-30: 1000 mg via INTRAVENOUS

## 2019-01-30 MED ORDER — ACETAMINOPHEN 500 MG PO TABS
ORAL_TABLET | ORAL | Status: AC
  Administered 2019-01-30: 1000 mg via ORAL
  Filled 2019-01-30: qty 2

## 2019-01-30 MED ORDER — MIDAZOLAM HCL 2 MG/2ML IJ SOLN
INTRAMUSCULAR | Status: AC
  Filled 2019-01-30: qty 2

## 2019-01-30 MED ORDER — HYDROMORPHONE HCL 1 MG/ML IJ SOLN
INTRAMUSCULAR | Status: AC
  Filled 2019-01-30: qty 1

## 2019-01-30 MED ORDER — GABAPENTIN 300 MG PO CAPS
ORAL_CAPSULE | ORAL | Status: AC
  Administered 2019-01-30: 300 mg via ORAL
  Filled 2019-01-30: qty 1

## 2019-01-30 MED ORDER — ONDANSETRON HCL 4 MG/2ML IJ SOLN
4.0000 mg | Freq: Four times a day (QID) | INTRAMUSCULAR | Status: DC | PRN
  Administered 2019-02-01 – 2019-02-05 (×5): 4 mg via INTRAVENOUS
  Filled 2019-01-30 (×5): qty 2

## 2019-01-30 MED ORDER — LIDOCAINE HCL (CARDIAC) PF 100 MG/5ML IV SOSY
PREFILLED_SYRINGE | INTRAVENOUS | Status: DC | PRN
  Administered 2019-01-30: 80 mg via INTRATRACHEAL

## 2019-01-30 MED ORDER — VANCOMYCIN HCL 1000 MG IV SOLR
INTRAVENOUS | Status: AC
  Filled 2019-01-30: qty 1000

## 2019-01-30 MED ORDER — SODIUM CHLORIDE 0.9 % IV SOLN
INTRAVENOUS | Status: DC | PRN
  Administered 2019-01-30: 70 mL

## 2019-01-30 MED ORDER — SODIUM CHLORIDE (PF) 0.9 % IJ SOLN
INTRAMUSCULAR | Status: AC
  Filled 2019-01-30: qty 50

## 2019-01-30 MED ORDER — SODIUM CHLORIDE 0.9 % IV SOLN
INTRAVENOUS | Status: DC | PRN
  Administered 2019-01-30: 50 ug/min via INTRAVENOUS

## 2019-01-30 MED ORDER — EPHEDRINE SULFATE 50 MG/ML IJ SOLN
INTRAMUSCULAR | Status: AC
  Filled 2019-01-30: qty 1

## 2019-01-30 MED ORDER — FAMOTIDINE 20 MG PO TABS
ORAL_TABLET | ORAL | Status: AC
  Administered 2019-01-30: 20 mg via ORAL
  Filled 2019-01-30: qty 1

## 2019-01-30 MED ORDER — LIDOCAINE HCL 4 % MT SOLN
OROMUCOSAL | Status: DC | PRN
  Administered 2019-01-30: 4 mL via TOPICAL

## 2019-01-30 MED ORDER — OXYCODONE HCL 5 MG PO TABS: 5.0000 mg | ORAL_TABLET | Freq: Once | ORAL | Status: DC | PRN

## 2019-01-30 MED ORDER — PROCHLORPERAZINE MALEATE 10 MG PO TABS
10.0000 mg | ORAL_TABLET | Freq: Four times a day (QID) | ORAL | Status: DC | PRN
  Filled 2019-01-30: qty 1

## 2019-01-30 MED ORDER — PHENYLEPHRINE HCL (PRESSORS) 10 MG/ML IV SOLN
INTRAVENOUS | Status: DC | PRN
  Administered 2019-01-30 (×5): 100 ug via INTRAVENOUS

## 2019-01-30 SURGICAL SUPPLY — 69 items
BARRIER ADH SEPRAFILM 3INX5IN (MISCELLANEOUS) ×4 IMPLANT
BLADE SURG SZ10 CARB STEEL (BLADE) ×3 IMPLANT
BNDG GAUZE 4.5X4.1 6PLY STRL (MISCELLANEOUS) ×2 IMPLANT
BULB RESERV EVAC DRAIN JP 100C (MISCELLANEOUS) ×6 IMPLANT
CANISTER SUCT 1200ML W/VALVE (MISCELLANEOUS) ×3 IMPLANT
COVER WAND RF STERILE (DRAPES) ×3 IMPLANT
DECANTER SPIKE VIAL GLASS SM (MISCELLANEOUS) ×3 IMPLANT
DEFOGGER SCOPE WARMER CLEARIFY (MISCELLANEOUS) ×3 IMPLANT
DERMABOND ADVANCED (GAUZE/BANDAGES/DRESSINGS)
DERMABOND ADVANCED .7 DNX12 (GAUZE/BANDAGES/DRESSINGS) ×2 IMPLANT
DRAIN CHANNEL JP 15F RND 16 (MISCELLANEOUS) ×2 IMPLANT
DRAIN CHANNEL JP 19F (MISCELLANEOUS) ×4 IMPLANT
DRAPE INCISE IOBAN 66X45 STRL (DRAPES) ×3 IMPLANT
ELECT CAUTERY BLADE 6.4 (BLADE) ×3 IMPLANT
ELECT REM PT RETURN 9FT ADLT (ELECTROSURGICAL) ×3
ELECTRODE REM PT RTRN 9FT ADLT (ELECTROSURGICAL) ×1 IMPLANT
GLOVE BIO SURGEON STRL SZ7 (GLOVE) ×19 IMPLANT
GOWN STRL REUS W/ TWL LRG LVL3 (GOWN DISPOSABLE) ×4 IMPLANT
GOWN STRL REUS W/TWL LRG LVL3 (GOWN DISPOSABLE) ×12
HANDLE SUCTION POOLE (INSTRUMENTS) ×1 IMPLANT
HANDLE YANKAUER SUCT BULB TIP (MISCELLANEOUS) ×3 IMPLANT
HOLDER FOLEY CATH W/STRAP (MISCELLANEOUS) ×3 IMPLANT
LIGASURE IMPACT 36 18CM CVD LR (INSTRUMENTS) ×2 IMPLANT
LIGASURE VESSEL 5MM BLUNT TIP (ELECTROSURGICAL) IMPLANT
NEEDLE HYPO 22GX1.5 SAFETY (NEEDLE) ×3 IMPLANT
NS IRRIG 1000ML POUR BTL (IV SOLUTION) ×3 IMPLANT
PACK COLON CLEAN CLOSURE (MISCELLANEOUS) ×3 IMPLANT
PACK LAP CHOLECYSTECTOMY (MISCELLANEOUS) ×3 IMPLANT
PAD ABD DERMACEA PRESS 5X9 (GAUZE/BANDAGES/DRESSINGS) ×8 IMPLANT
PENCIL ELECTRO HAND CTR (MISCELLANEOUS) ×3 IMPLANT
RELOAD PROXIMATE 75MM BLUE (ENDOMECHANICALS) ×12 IMPLANT
RELOAD STAPLE 60 2.6 WHT THN (STAPLE) ×2 IMPLANT
RELOAD STAPLE 60 3.6 BLU REG (STAPLE) ×2 IMPLANT
RELOAD STAPLE 75 3.8 BLU REG (ENDOMECHANICALS) IMPLANT
RELOAD STAPLER BLUE 60MM (STAPLE) IMPLANT
RELOAD STAPLER WHITE 60MM (STAPLE) IMPLANT
RETRACTOR WOUND ALXS 18CM MED (MISCELLANEOUS) IMPLANT
RTRCTR WOUND ALEXIS O 18CM MED (MISCELLANEOUS)
SHEARS HARMONIC ACE PLUS 36CM (ENDOMECHANICALS) ×3 IMPLANT
SLEEVE ENDOPATH XCEL 5M (ENDOMECHANICALS) IMPLANT
SPONGE DRAIN TRACH 4X4 STRL 2S (GAUZE/BANDAGES/DRESSINGS) ×4 IMPLANT
SPONGE LAP 18X18 RF (DISPOSABLE) ×8 IMPLANT
SPONGE LAP 18X36 RFD (DISPOSABLE) ×1 IMPLANT
STAPLE ECHEON FLEX 60 POW ENDO (STAPLE) ×1 IMPLANT
STAPLER PROXIMATE 75MM BLUE (STAPLE) ×2 IMPLANT
STAPLER RELOAD BLUE 60MM (STAPLE)
STAPLER RELOAD WHITE 60MM (STAPLE)
SUCTION POOLE HANDLE (INSTRUMENTS)
SUT ETHILON 3-0 FS-10 30 BLK (SUTURE) ×9
SUT MNCRL 4-0 (SUTURE) ×2
SUT MNCRL 4-0 27XMFL (SUTURE) ×1
SUT PDS AB 0 CT1 27 (SUTURE) ×4 IMPLANT
SUT SILK 2 0 (SUTURE) ×2
SUT SILK 2 0 SH CR/8 (SUTURE) ×3 IMPLANT
SUT SILK 2-0 30XBRD TIE 12 (SUTURE) ×1 IMPLANT
SUT VIC AB 3-0 SH 27 (SUTURE) ×2
SUT VIC AB 3-0 SH 27X BRD (SUTURE) ×1 IMPLANT
SUT VICRYL 0 AB UR-6 (SUTURE) ×4 IMPLANT
SUTURE EHLN 3-0 FS-10 30 BLK (SUTURE) IMPLANT
SUTURE MNCRL 4-0 27XMF (SUTURE) ×2 IMPLANT
SYR 30ML LL (SYRINGE) ×3 IMPLANT
SYS LAPSCP GELPORT 120MM (MISCELLANEOUS) ×3
SYSTEM LAPSCP GELPORT 120MM (MISCELLANEOUS) ×1 IMPLANT
TOWEL OR 17X26 4PK STRL BLUE (TOWEL DISPOSABLE) ×3 IMPLANT
TRAY FOLEY MTR SLVR 16FR STAT (SET/KITS/TRAYS/PACK) ×3 IMPLANT
TROCAR XCEL 12X100 BLDLESS (ENDOMECHANICALS) ×3 IMPLANT
TROCAR XCEL BLUNT TIP 100MML (ENDOMECHANICALS) ×3 IMPLANT
TROCAR XCEL NON-BLD 5MMX100MML (ENDOMECHANICALS) ×3 IMPLANT
TUBING EVAC SMOKE HEATED PNEUM (TUBING) ×3 IMPLANT

## 2019-01-30 NOTE — Anesthesia Postprocedure Evaluation (Signed)
Anesthesia Post Note  Patient: Tristan [name]  Procedure(s) Performed: LAPAROSCOPIC HAND ASSISTED RIGHT COLECTOMY CONVERTED TO OPEN PROCEDURE (N/A ) COLONOSCOPY (N/A )  Patient location during evaluation: PACU Anesthesia Type: General Level of consciousness: awake and alert and oriented Pain management: pain level controlled Vital Signs Assessment: post-procedure vital signs reviewed and stable Respiratory status: spontaneous breathing, nonlabored ventilation and respiratory function stable Cardiovascular status: blood pressure returned to baseline and stable Postop Assessment: no signs of nausea or vomiting Anesthetic complications: no     Last Vitals:  Vitals:   01/30/19 1408 01/30/19 1427  BP:  109/77  Pulse: 99 98  Resp: 14 16  Temp:    SpO2: 97% 97%    Last Pain:  Vitals:   01/30/19 1408  TempSrc:   PainSc: 0-No pain                 Sherrina Zaugg

## 2019-01-30 NOTE — Anesthesia Procedure Notes (Signed)
Procedure Name: Intubation Date/Time: 01/30/2019 7:40 AM Performed by: Eben Burow, CRNA Pre-anesthesia Checklist: Patient identified, Emergency Drugs available, Suction available and Patient being monitored Patient Re-evaluated:Patient Re-evaluated prior to induction Oxygen Delivery Method: Circle system utilized Preoxygenation: Pre-oxygenation with 100% oxygen Induction Type: IV induction Ventilation: Mask ventilation without difficulty Laryngoscope Size: Mac and 4 Grade View: Grade I Tube type: Oral Tube size: 7.5 mm Number of attempts: 1 Airway Equipment and Method: Stylet and LTA kit utilized Placement Confirmation: ETT inserted through vocal cords under direct vision,  positive ETCO2 and breath sounds checked- equal and bilateral Secured at: 23 cm Tube secured with: Tape Dental Injury: Teeth and Oropharynx as per pre-operative assessment

## 2019-01-30 NOTE — Anesthesia Preprocedure Evaluation (Signed)
Anesthesia Evaluation  Patient identified by MRN, date of birth, ID band Patient awake    Reviewed: Allergy & Precautions, NPO status , Patient's Chart, lab work & pertinent test results  History of Anesthesia Complications Negative for: history of anesthetic complications  Airway Mallampati: III  TM Distance: <3 FB Neck ROM: Full    Dental no notable dental hx.    Pulmonary asthma , Current Smoker and Patient abstained from smoking.,    breath sounds clear to auscultation- rhonchi (-) wheezing      Cardiovascular Exercise Tolerance: Good (-) hypertension(-) CAD, (-) Past MI, (-) Cardiac Stents and (-) CABG  Rhythm:Regular Rate:Normal - Systolic murmurs and - Diastolic murmurs    Neuro/Psych neg Seizures negative psych ROS   GI/Hepatic Neg liver ROS, GERD  ,  Endo/Other  negative endocrine ROSneg diabetes  Renal/GU negative Renal ROS     Musculoskeletal negative musculoskeletal ROS (+)   Abdominal (+) - obese,   Peds  Hematology negative hematology ROS (+)   Anesthesia Other Findings Past Medical History: No date: Abdominal pain No date: Asthma No date: GERD (gastroesophageal reflux disease) No date: Nausea No date: Poor appetite No date: Weight loss   Reproductive/Obstetrics                             Anesthesia Physical Anesthesia Plan  ASA: II  Anesthesia Plan: General   Post-op Pain Management:    Induction: Intravenous  PONV Risk Score and Plan: 0 and Ondansetron and Midazolam  Airway Management Planned: Oral ETT  Additional Equipment:   Intra-op Plan:   Post-operative Plan: Extubation in OR  Informed Consent: I have reviewed the patients History and Physical, chart, labs and discussed the procedure including the risks, benefits and alternatives for the proposed anesthesia with the patient or authorized representative who has indicated his/her understanding and  acceptance.     Dental advisory given  Plan Discussed with: CRNA and Anesthesiologist  Anesthesia Plan Comments:         Anesthesia Quick Evaluation

## 2019-01-30 NOTE — H&P (Addendum)
[name], MD [address] [place], [place], [place], [address] [address], [place], [place], [place], [address] Phone: [phone]  Fax: [phone]  Primary Care Physician:  Patient, No Pcp Per   Pre-Procedure History & Physical: HPI:  Tristan [name] is a 38 y.o. male is here for an colonoscopy.   Past Medical History:  Diagnosis Date  . Abdominal pain   . Asthma   . GERD (gastroesophageal reflux disease)   . Nausea   . Poor appetite   . Weight loss     Past Surgical History:  Procedure Laterality Date  . COLONOSCOPY WITH PROPOFOL N/A 08/16/2016   Procedure: COLONOSCOPY WITH PROPOFOL;  Surgeon: [name], MD;  Location: [facility] ENDOSCOPY;  Service: Endoscopy;  Laterality: N/A;  . NO PAST SURGERIES      Prior to Admission medications   Medication Sig Start Date End Date Taking? Authorizing Provider  bisacodyl (DULCOLAX) 5 MG EC tablet Take all 4 tablets at 8 am the morning prior to your surgery. 01/14/19  Yes Pabon, Diego F, MD  calcium carbonate (TUMS - DOSED IN MG ELEMENTAL CALCIUM) 500 MG chewable tablet Chew 2 tablets by mouth 2 (two) times daily as needed for indigestion or heartburn.   Yes [provider]  erythromycin base (E-MYCIN) 500 MG tablet Take 2 tablets at 8am, 2 tablets at 2pm, and 2 tablets at 8pm the day prior to surgery. 01/14/19  Yes Pabon, Diego F, MD  gabapentin (NEURONTIN) 300 MG capsule Take 1 capsule (300 mg total) by mouth 3 (three) times daily. 01/22/19  Yes Ronny Bacon, MD  ketorolac (TORADOL) 10 MG tablet Take 1 tablet (10 mg total) by mouth every 6 (six) hours as needed. 01/22/19  Yes Ronny Bacon, MD  neomycin (MYCIFRADIN) 500 MG tablet Take 2 tablet at 8am, take 2 tablets at 2pm, and take 2 tablets at 8pm the day prior to your surgery 01/14/19  Yes Pabon, Diego F, MD  polyethylene glycol powder (MIRALAX) 17 GM/SCOOP powder Mix full container in 64 ounces of Gatorade or other clear liquid. NO RED LIQUIDS 01/14/19  Yes  Pabon, Diego F, MD  albuterol (PROVENTIL HFA;VENTOLIN HFA) 108 (90 Base) MCG/ACT inhaler Inhale 2 puffs into the lungs every 4 (four) hours as needed for wheezing. Patient not taking: Reported on 01/26/2019 12/01/17   Marylene Land, NP  HYDROcodone-acetaminophen (NORCO/VICODIN) 5-325 MG tablet Take 1 tablet by mouth every 4 (four) hours as needed. 12/29/18   Harvest Dark, MD    Allergies as of 01/14/2019  . (No Known Allergies)    Family History  Problem Relation Age of Onset  . Diabetes Mother   . Healthy Father   . Colon polyps Father   . Colon cancer Other   . Ulcerative colitis Paternal Aunt     Social History   Socioeconomic History  . Marital status: Single    Spouse name: Not on file  . Number of children: Not on file  . Years of education: Not on file  . Highest education level: Not on file  Occupational History  . Not on file  Social Needs  . Financial resource strain: Not on file  . Food insecurity    Worry: Not on file    Inability: Not on file  . Transportation needs    Medical: Not on file    Non-medical: Not on file  Tobacco Use  . Smoking status: Current Every Day Smoker    Packs/day: 0.50  Types: Cigarettes  . Smokeless tobacco: Never Used  Substance and Sexual Activity  . Alcohol use: Not Currently  . Drug use: Yes    Types: Marijuana    Comment: last week  . Sexual activity: Yes    Birth control/protection: Condom  Lifestyle  . Physical activity    Days per week: Not on file    Minutes per session: Not on file  . Stress: Not on file  Relationships  . Social Herbalist on phone: Not on file    Gets together: Not on file    Attends religious service: Not on file    Active member of club or organization: Not on file    Attends meetings of clubs or organizations: Not on file    Relationship status: Not on file  . Intimate partner violence    Fear of current or ex partner: Not on file    Emotionally abused: Not on file     Physically abused: Not on file    Forced sexual activity: Not on file  Other Topics Concern  . Not on file  Social History Narrative  . Not on file    Review of Systems: See HPI, otherwise negative ROS  Physical Exam: BP 134/82   Pulse (!) 123   Temp 98.5 F (36.9 C) (Tympanic)   Resp 17   Ht 6\' 1"  (1.854 m)   Wt 79.4 kg   SpO2 100%   BMI 23.09 kg/m  General:   Alert,  pleasant and cooperative in NAD Head:  Normocephalic and atraumatic. Neck:  Supple; no masses or thyromegaly. Lungs:  Clear throughout to auscultation, normal respiratory effort.    Heart:  +S1, +S2, Regular rate and rhythm, No edema. Abdomen:  Soft, nontender and nondistended. Normal bowel sounds, without guarding, and without rebound.   Neurologic:  Alert and  oriented x4;  grossly normal neurologically.  Impression/Plan: Tristan [name] is here for an colonoscopy to be performed for abnormal ct scan of the abdomen : being performed intra op, Previously unable to go past sigmoid colon - will repeat and try examine sigmoid colon to rule out any lesions within the lumen .Risks, benefits, limitations, and alternatives regarding  colonoscopy have been reviewed with the patient.  Questions have been answered.  All parties agreeable.   [name], MD  01/30/2019, 7:30 AM

## 2019-01-30 NOTE — Op Note (Signed)
[facility] Gastroenterology Patient Name: Tristan [name] Procedure Date: 01/30/2019 7:06 AM MRN: [id number] Account #: [id number] Date of Birth: December 12, 1980 Admit Type: Outpatient Age: 38 Room: [facility] ENDO ROOM 4 Gender: Male Note Status: Finalized Procedure:             Colonoscopy Indications:           Abnormal barium enema, Abnormal CT of the GI tract Providers:             [name] MD, MD Referring MD:          No Local Md, MD (Referring MD) Medicines:             General Anesthesia Complications:         No immediate complications. Procedure:             Pre-Anesthesia Assessment:                        - Prior to the procedure, a History and Physical was                         performed, and patient medications, allergies and                         sensitivities were reviewed. The patient's tolerance                         of previous anesthesia was reviewed.                        - The risks and benefits of the procedure and the                         sedation options and risks were discussed with the                         patient. All questions were answered and informed                         consent was obtained.                        - ASA Grade Assessment: II - A patient with mild                         systemic disease.                        After obtaining informed consent, the colonoscope was                         passed under direct vision. Throughout the procedure,                         the patient's blood pressure, pulse, and oxygen                         saturations were monitored continuously. The                         Colonoscope was introduced  through the anus with the                         intention of advancing to the cecum. The scope was                         advanced to the sigmoid colon before the procedure was                         aborted. Medications were given. The colonoscopy was   somewhat difficult due to significant looping. The                         patient tolerated the procedure well. The quality of                         the bowel preparation was good. Findings:      The perianal and digital rectal examinations were normal.      A benign-appearing, intrinsic severe stenosis was found in the sigmoid       colon and was non-traversed. Impression:            - Stricture in the sigmoid colon.                        - No specimens collected. Recommendation:        - Proceed with surgery : will plan repeat colonoscopy                         to ostomysite in the future as when they opened open                         he had multiple abscesses . Procedure Code(s):     --- Professional ---                        [phone], 44, Colonoscopy, flexible; diagnostic,                         including collection of specimen(s) by brushing or                         washing, when performed (separate procedure) Diagnosis Code(s):     --- Professional ---                        [id number], Other intestinal obstruction unspecified as                         to partial versus complete obstruction                        R93.3, Abnormal findings on diagnostic imaging of                         other parts of digestive tract CPT copyright 2019 American Medical Association. All rights reserved. The codes documented in this report are preliminary and upon coder review may  be revised to meet current compliance requirements. [name], MD [name] MD, MD 01/30/2019 9:05:23 AM This  report has been signed electronically. Number of Addenda: 0 Note Initiated On: 01/30/2019 7:06 AM Estimated Blood Loss:  Estimated blood loss: none.      [facility]

## 2019-01-30 NOTE — Interval H&P Note (Signed)
History and Physical Interval Note:  01/30/2019 7:17 AM  Tristan [name]  has presented today for surgery, with the diagnosis of Colon Mass.  The various methods of treatment have been discussed with the patient and family. After consideration of risks, benefits and other options for treatment, the patient has consented to  Procedure(s): LAPAROSCOPIC HAND ASSISTED RIGHT COLECTOMY (N/A) COLONOSCOPY (N/A) as a surgical intervention.  The patient's history has been reviewed, patient examined, no change in status, stable for surgery.  I have reviewed the patient's chart and labs.  Questions were answered to the patient's satisfaction.     [place]

## 2019-01-30 NOTE — Anesthesia Post-op Follow-up Note (Signed)
Anesthesia QCDR form completed.        

## 2019-01-30 NOTE — Consult Note (Signed)
Pharmacy Antibiotic Note  Tristan [name] is a 38 y.o. male admitted on 01/30/2019 with Pelvic abscess.  Pharmacy has been consulted for vancomycin dosing.  Plan: Will give a vancomycin 1500 mg loading dose followed by a 1250 mg q8H maintenance dose. Predicted AUC 501 with a goal AUC of 400-550. Css min 14.4. Scr used. 0.75. Plan to order vancomycin levels prior to the 5th dose.   Height: 6\' 1"  (185.4 cm) Weight: 175 lb (79.4 kg) IBW/kg (Calculated) : 79.9  Temp (24hrs), Avg:97.9 F (36.6 C), Min:97.1 F (36.2 C), Max:98.5 F (36.9 C)  No results for input(s): WBC, CREATININE, LATICACIDVEN, VANCOTROUGH, VANCOPEAK, VANCORANDOM, GENTTROUGH, GENTPEAK, GENTRANDOM, TOBRATROUGH, TOBRAPEAK, TOBRARND, AMIKACINPEAK, AMIKACINTROU, AMIKACIN in the last 168 hours.  Estimated Creatinine Clearance: 142 mL/min (by C-G formula based on SCr of 0.75 mg/dL).    No Known Allergies  Antimicrobials this admission: 11/20 vancomycin >>   Dose adjustments this admission: None  Microbiology results: 11/20 Wounc Cx: pending Gram stain: MANY GRAM POSITIVE COCCI, FEW GRAM NEGATIVE RODS, FEW GRAM POSITIVE RODS   Thank you for allowing pharmacy to be a part of this patient's care.  Oswald Hillock, PharmD, BCPS 01/30/2019 11:44 AM

## 2019-01-30 NOTE — Op Note (Signed)
PROCEDURES: 1. Right extended Hemicolectomy 2. Drainage of pelvic abscess open 3. Drainage if right peritoneal abscess open 4. Placement of # 19 FR blake drain , one pelvic and another one right retroperitoneal 5 Repair of umbilical hernia  Pre-operative Diagnosis: Inflammatory mass right colon  Post-operative Diagnosis: Same  Surgeon: Marjory Lies    Assistants: Dr. Christian Mate Required due to the complexity of the case: for exposure and creation of the anastomosis  Anesthesia: General endotracheal anesthesia  ASA Class: 2  Surgeon: Caroleen Hamman , MD FACS  Anesthesia: Gen. 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 The benefits, complications, treatment options, and expected outcomes were discussed with the patient. The risks of bleeding, infection, recurrence of symptoms, failure to resolve symptoms,  bowel injury, any of which could require further surgery were reviewed with the patient.   The patient was taken to Operating Room, identified  and the procedure verified.  A Time Out was held and the above information confirmed. Larnell is a 38 year old male with a complex abdominal history with chronic abdominal pain and phlegmon that has been chronic.  He did develop abscesses and we recommended admission a week before with drainage of the abscess cavities.  The patient declined this option and chose to take some p.o. antibiotics.  He was able to  tolerate his bowel prep but have persistent pain. Due to the inability to evaluate the left colon Dr. Vicente Males wanted to do an intraoperative scope.  He is started the procedure and please see his operative report. Prior to the induction of general anesthesia, antibiotic prophylaxis was administered. VTE prophylaxis was in place. General endotracheal anesthesia was then administered and tolerated well. After the induction, the abdomen was prepped with Chloraprep and draped in the sterile fashion. The patient was positioned in the lithotomy position all bony prominences padded.  We placed him in lithotomy position due to the possibility of doing intraoperative colonoscopy.  7 cm incision was created as a midline mini laparotomy.  A small hernia defect was visualized and the hernia sac was excised.  The abdominal cavity was entered under direct visualization and the GelPort device was placed.  I inserted my hand and immediately felt a phlegmon that was complex on the right lower quadrant and significant inflammatory response with ascites.  At this time I quickly realized that this was not going to be able to be done laparoscopically and I started with a generous midline laparotomy incision.  Action of the abdomen revealed a large phlegmon/mass within the right colon.  There was also evidence of transverse colon stuck to the right cecum.  Week mobilized the cecum from a lateral to medial position and there was an abscess abscess cavity that I drained that was entering and communicating to the cecum and was tracking to the right retroperitoneum.  Pus was drained and aspirated.  Attention then was turned to the pelvis where another separate abscess cavity was drained and culture.  Pus was drained as well.  Around 5200 cc of pus was drained from each cavity.  We obtain appropriate cultures.  Felt the sigmoid colon there was free of disease.  Because of this I consider that intraoperative colonoscopy was not necessary at  this time.  The greater omentum was divided and the hepatic flexure was taken down using harmonic scalpel.  The white line of Toldt was incised and a lateral to medial dissection was performed.  We identified the right ureter as well as the duodenum and preserve both structures at all times. We Were also able to mobilize the attachments of the cecum and terminal ileum.  Once we had an adequate mobilization   A 15 cm margin on the terminal ileum was identified and we created a window with electrocautery and divided the terminal ileum.  Attention then was turned to the distal excision margin.  We identified the middle colic artery on selected a spot Left  to the middle colic artery.  Were able to also use a 75 GIA stapler to divide this area.  The mesentery was scored with electrocautery.  We identified the right colic artery and suture ligated with 2 oh silks in the standard fashion.  The rest of the mesentery was divided using the harmonic scalpel.  Please note that we went as low as possible to the base of the mesentery to obtain adequate lymph nodes and adequate margins of dissection. Specimen was passed and sent to permanent pathology. I was able to run the small bowel from the ligament of Treitz all the way to the terminal ileum.  There was some reactive inflammatory response around the terminal ileum but not primarily small bowel pathology.  There was no evidence of distant metastatic disease within the liver either.  Both ends of the transverse colon and the terminal ileum respectively had a healthy appearance with good perfusion.  I did think significantly about whether or not an anastomosis would be safe in this case.  The patient had adequate hemodynamics.  The tissue perfusion was well and there was significant improvement after were able to remove the right colon and drain all the infection.  At This point I decided to do an anastomosis. #19 Keenan Bachelor drain was placed on the right lower quadrant and  tract to the retroperitoneal cavity on the right side.  And no other suprapubic drain was placed within the pelvis.  Both drains were attached to the skin using 3-0 nylons.   A standard side-to-side functional end to end staple anastomosiswas created with multiple loads of a 75 GIA stapler device.  We check for patency as well as leak.  There was a tension-free anastomosis with good perfusion and no evidence of intraoperative leak.  The mesenteric defect was closed with a running 2-0 Vicryl in the standard fashion. We changed gloves and place a clean closure tray.   liposomal Marcaine was injected throughout the abdominal wall on both sides under direct visualization and palpation.  The fascia was closed with a running 0 PDS using the small bite techniques.  We incorporated the umbilical defect along with our laparotomy closure. Due to the significant degree of contamination I decided not to close the skin and place a Kerlix as a wet-to-dry dressing to the abdominal wound.  Needle and laparotomy counts were correct and there were no immediate complications     Caroleen Hamman, MD,  FACS

## 2019-01-30 NOTE — Transfer of Care (Signed)
Immediate Anesthesia Transfer of Care Note  Patient: Tristan [name]  Procedure(s) Performed: LAPAROSCOPIC HAND ASSISTED RIGHT COLECTOMY CONVERTED TO OPEN PROCEDURE (N/A ) COLONOSCOPY (N/A )  Patient Location: PACU  Anesthesia Type:General  Level of Consciousness: awake, alert , oriented and patient cooperative  Airway & Oxygen Therapy: Patient Spontanous Breathing and Patient connected to face mask oxygen  Post-op Assessment: Report given to RN and Post -op Vital signs reviewed and stable  Post vital signs: Reviewed and stable  Last Vitals:  Vitals Value Taken Time  BP 102/76 01/30/19 1138  Temp    Pulse 101 01/30/19 1142  Resp 21 01/30/19 1142  SpO2 100 % 01/30/19 1142  Vitals shown include unvalidated device data.  Last Pain:  Vitals:   01/30/19 0629  TempSrc: Tympanic  PainSc: 9          Complications: No apparent anesthesia complications

## 2019-01-31 ENCOUNTER — Encounter: Payer: Self-pay | Admitting: Surgery

## 2019-01-31 LAB — BASIC METABOLIC PANEL
Anion gap: 12 (ref 5–15)
Anion gap: 11 (ref 5–15)
BUN: 24 mg/dL — ABNORMAL HIGH (ref 6–20)
BUN: 24 mg/dL — ABNORMAL HIGH (ref 6–20)
CO2: 25 mmol/L (ref 22–32)
CO2: 25 mmol/L (ref 22–32)
Calcium: 7.6 mg/dL — ABNORMAL LOW (ref 8.9–10.3)
Calcium: 7.3 mg/dL — ABNORMAL LOW (ref 8.9–10.3)
Chloride: 97 mmol/L — ABNORMAL LOW (ref 98–111)
Chloride: 97 mmol/L — ABNORMAL LOW (ref 98–111)
Creatinine, Ser: 1.12 mg/dL (ref 0.61–1.24)
Creatinine, Ser: 1.21 mg/dL (ref 0.61–1.24)
GFR calc Af Amer: >60 mL/min (ref >60)
GFR calc Af Amer: >60 mL/min (ref >60)
GFR calc non Af Amer: >60 mL/min (ref >60)
GFR calc non Af Amer: >60 mL/min (ref >60)
Glucose, Bld: 137 mg/dL — ABNORMAL HIGH (ref 70–99)
Glucose, Bld: 132 mg/dL — ABNORMAL HIGH (ref 70–99)
Potassium: 5.3 mmol/L — ABNORMAL HIGH (ref 3.5–5.1)
Potassium: 4.8 mmol/L (ref 3.5–5.1)
Sodium: 134 mmol/L — ABNORMAL LOW (ref 135–145)
Sodium: 133 mmol/L — ABNORMAL LOW (ref 135–145)

## 2019-01-31 LAB — VANCOMYCIN, TROUGH: Vancomycin Tr: 43 ug/mL (ref 15–20)

## 2019-01-31 LAB — MAGNESIUM: Magnesium: 2.5 mg/dL — ABNORMAL HIGH (ref 1.7–2.4)

## 2019-01-31 LAB — CBC
HCT: 22.3 % — ABNORMAL LOW (ref 39.0–52.0)
Hemoglobin: 6.9 g/dL — ABNORMAL LOW (ref 13.0–17.0)
MCH: 21 pg — ABNORMAL LOW (ref 26.0–34.0)
MCHC: 30.9 g/dL (ref 30.0–36.0)
MCV: 68 fL — ABNORMAL LOW (ref 80.0–100.0)
Platelets: 359 10*3/uL (ref 150–400)
RBC: 3.28 MIL/uL — ABNORMAL LOW (ref 4.22–5.81)
RDW: 18.5 % — ABNORMAL HIGH (ref 11.5–15.5)
WBC: 12.2 10*3/uL — ABNORMAL HIGH (ref 4.0–10.5)
nRBC: 0 % (ref 0.0–0.2)

## 2019-01-31 LAB — PHOSPHORUS
Phosphorus: 6.3 mg/dL — ABNORMAL HIGH (ref 2.5–4.6)
Phosphorus: 6 mg/dL — ABNORMAL HIGH (ref 2.5–4.6)

## 2019-01-31 LAB — VANCOMYCIN, PEAK: Vancomycin Pk: 25 ug/mL — ABNORMAL LOW (ref 30–40)

## 2019-01-31 MED ORDER — VANCOMYCIN VARIABLE DOSE PER UNSTABLE RENAL FUNCTION (PHARMACIST DOSING): Status: DC

## 2019-01-31 MED ORDER — ALBUMIN HUMAN 25 % IV SOLN
25.0000 g | Freq: Once | INTRAVENOUS | Status: AC
  Administered 2019-01-31: 25 g via INTRAVENOUS
  Filled 2019-01-31: qty 100

## 2019-01-31 MED ORDER — SODIUM CHLORIDE 0.9 % IV BOLUS
1000.0000 mL | Freq: Once | INTRAVENOUS | Status: AC
  Administered 2019-01-31: 1000 mL via INTRAVENOUS

## 2019-01-31 NOTE — Op Note (Signed)
CC: colon mass Subjective: S/p right extended colectomy Doing better than preoperatively. Electrolytes completely out of balance.  There is significant decrease in hemoglobin likely from aggressive fluid resuscitation due to his sIRS and infection. Having ice chips  Objective: Vital signs in last 24 hours: Temp:  [97.5 F (36.4 C)-98.1 F (36.7 C)] 98.1 F (36.7 C) (11/21 1147) Pulse Rate:  [83-100] 83 (11/21 1147) Resp:  [12-18] 16 (11/21 1147) BP: (97-109)/(52-77) 99/52 (11/21 1147) SpO2:  [97 %-100 %] 100 % (11/21 1147) Last BM Date: 01/30/19  Intake/Output from previous day: 11/20 0701 - 11/21 0700 In: 5107.9 [I.V.:3955.8; IV Piggyback:1062.1] Out: 1975 [Urine:575; Emesis/NG output:680; Drains:520; Blood:200] Intake/Output this shift: Total I/O In: 70 [Other:70] Out: 400 [Urine:400]  Physical exam:  NAD Abd : soft, open wound healing well, fascia intact. No infection. JP serous fluid Ext: Well perfused  Lab Results: CBC  Recent Labs    01/30/19 1451 01/31/19 0709  WBC 14.3* 12.2*  HGB 8.8* 6.9*  HCT 28.0* 22.3*  PLT 440* 359   BMET Recent Labs    01/31/19 0709 01/31/19 0852  NA 134* 133*  K 5.3* 4.8  CL 97* 97*  CO2 25 25  GLUCOSE 137* 132*  BUN 24* 24*  CREATININE 1.12 1.21  CALCIUM 7.6* 7.3*   PT/INR No results for input(s): LABPROT, INR in the last 72 hours. ABG No results for input(s): PHART, HCO3 in the last 72 hours.  Invalid input(s): PCO2, PO2  Studies/Results: No results found.  Anti-infectives: Anti-infectives (From admission, onward)   Start     Dose/Rate Route Frequency Ordered Stop   01/30/19 2200  vancomycin (VANCOCIN) 1,250 mg in sodium chloride 0.9 % 250 mL IVPB     1,250 mg 166.7 mL/hr over 90 Minutes Intravenous Every 8 hours 01/30/19 1157     01/30/19 1700  piperacillin-tazobactam (ZOSYN) IVPB 3.375 g     3.375 g 12.5 mL/hr over 240 Minutes Intravenous Every 8 hours 01/30/19 1423     01/30/19 1500  vancomycin  (VANCOCIN) 1,500 mg in sodium chloride 0.9 % 500 mL IVPB     1,500 mg 250 mL/hr over 120 Minutes Intravenous  Once 01/30/19 1156 01/30/19 1655   01/30/19 1200  vancomycin (VANCOCIN) 1,250 mg in sodium chloride 0.9 % 250 mL IVPB  Status:  Discontinued     1,250 mg 166.7 mL/hr over 90 Minutes Intravenous Every 8 hours 01/30/19 1156 01/30/19 1156   01/30/19 0855  piperacillin-tazobactam (ZOSYN) 3.375 GM/50ML IVPB    Note to Pharmacy: Ronnell Freshwater   : cabinet override      01/30/19 0855 01/30/19 0932   01/30/19 0600  cefoTEtan (CEFOTAN) 2 g in sodium chloride 0.9 % 100 mL IVPB     2 g 200 mL/hr over 30 Minutes Intravenous On call to O.R. 01/30/19 0011 01/30/19 [id number]      Assessment/Plan: POD # 1 Doing well considering degree of infection and SIRS Still needs some crystalloid and colloid Goal hemoglobin is less than 7 his hemodynamics are good and this is likely dilutional given the amount of crystalloids he has received in the last 24 hours. DC Foley Continue NG tube for now Continue A/Bs  Caroleen Hamman, MD, [facility]  01/31/2019

## 2019-01-31 NOTE — Consult Note (Signed)
Pharmacy Antibiotic Note  Tristan [name] is a 38 y.o. male admitted on 01/30/2019 with Pelvic abscess.  Pharmacy has been consulted for vancomycin dosing.  Plan: Will give a vancomycin 1500 mg loading dose followed by a 1250 mg IV every 8 hours maintenance dose. Predicted AUC 501 with a goal AUC of 400-550. Css min 14.4. Scr used. 0.75. Plan to order vancomycin levels prior to the 5th dose.    11/21 @ 16:30 Vanc Peak 11/21 @ 21:30 Vanc Trough  Height: 6\' 1"  (185.4 cm) Weight: 175 lb (79.4 kg) IBW/kg (Calculated) : 79.9  Temp (24hrs), Avg:97.3 F (36.3 C), Min:96.7 F (35.9 C), Max:97.6 F (36.4 C)  Recent Labs  Lab 01/30/19 1451 01/31/19 0709 01/31/19 0852  WBC 14.3* 12.2*  --   CREATININE 1.05 1.12 1.21    Estimated Creatinine Clearance: 93.9 mL/min (by C-G formula based on SCr of 1.21 mg/dL).    No Known Allergies  Antimicrobials this admission: 11/20 vancomycin >>   Dose adjustments this admission: None  Microbiology results: 11/20 Wound Cx: pending Gram stain: MANY GRAM POSITIVE COCCI, FEW GRAM NEGATIVE RODS, FEW GRAM POSITIVE RODS   Thank you for allowing pharmacy to be a part of this patient's care.  [name], [facility] 01/31/2019 11:15 AM

## 2019-01-31 NOTE — Progress Notes (Signed)
Patient refused to get up to sit in chair a couple of times today, but he walked around the unit once and did well.

## 2019-02-01 DIAGNOSIS — E43 Unspecified severe protein-calorie malnutrition: Secondary | ICD-10-CM | POA: Insufficient documentation

## 2019-02-01 LAB — MAGNESIUM: Magnesium: 2.7 mg/dL — ABNORMAL HIGH (ref 1.7–2.4)

## 2019-02-01 LAB — COMPREHENSIVE METABOLIC PANEL
ALT: 23 U/L (ref 0–44)
AST: 23 U/L (ref 15–41)
Albumin: 2 g/dL — ABNORMAL LOW (ref 3.5–5.0)
Alkaline Phosphatase: 37 U/L — ABNORMAL LOW (ref 38–126)
Anion gap: 11 (ref 5–15)
BUN: 22 mg/dL — ABNORMAL HIGH (ref 6–20)
CO2: 23 mmol/L (ref 22–32)
Calcium: 7.4 mg/dL — ABNORMAL LOW (ref 8.9–10.3)
Chloride: 103 mmol/L (ref 98–111)
Creatinine, Ser: 0.97 mg/dL (ref 0.61–1.24)
GFR calc Af Amer: >60 mL/min (ref >60)
GFR calc non Af Amer: >60 mL/min (ref >60)
Glucose, Bld: 92 mg/dL (ref 70–99)
Potassium: 3.9 mmol/L (ref 3.5–5.1)
Sodium: 137 mmol/L (ref 135–145)
Total Bilirubin: 0.4 mg/dL (ref 0.3–1.2)
Total Protein: 5.6 g/dL — ABNORMAL LOW (ref 6.5–8.1)

## 2019-02-01 LAB — CBC
HCT: 19.1 % — ABNORMAL LOW (ref 39.0–52.0)
Hemoglobin: 5.7 g/dL — ABNORMAL LOW (ref 13.0–17.0)
MCH: 21.1 pg — ABNORMAL LOW (ref 26.0–34.0)
MCHC: 29.8 g/dL — ABNORMAL LOW (ref 30.0–36.0)
MCV: 70.7 fL — ABNORMAL LOW (ref 80.0–100.0)
Platelets: 351 10*3/uL (ref 150–400)
RBC: 2.7 MIL/uL — ABNORMAL LOW (ref 4.22–5.81)
RDW: 18 % — ABNORMAL HIGH (ref 11.5–15.5)
WBC: 9.1 10*3/uL (ref 4.0–10.5)
nRBC: 0 % (ref 0.0–0.2)

## 2019-02-01 LAB — VANCOMYCIN, RANDOM: Vancomycin Rm: 20

## 2019-02-01 LAB — PHOSPHORUS: Phosphorus: 3.4 mg/dL (ref 2.5–4.6)

## 2019-02-01 LAB — PREPARE RBC (CROSSMATCH)

## 2019-02-01 MED ORDER — VANCOMYCIN HCL 1.25 G IV SOLR
1250.0000 mg | Freq: Two times a day (BID) | INTRAVENOUS | Status: DC
  Administered 2019-02-01 – 2019-02-02 (×4): 1250 mg via INTRAVENOUS
  Filled 2019-02-01 (×6): qty 1250

## 2019-02-01 MED ORDER — SODIUM CHLORIDE 0.9% IV SOLUTION
Freq: Once | INTRAVENOUS | Status: AC
  Administered 2019-02-01: 13:00:00 via INTRAVENOUS

## 2019-02-01 MED ORDER — VANCOMYCIN HCL 10 G IV SOLR
1250.0000 mg | Freq: Two times a day (BID) | INTRAVENOUS | Status: DC
  Filled 2019-02-01 (×2): qty 1250

## 2019-02-01 MED ORDER — BOOST / RESOURCE BREEZE PO LIQD CUSTOM
1.0000 | Freq: Three times a day (TID) | ORAL | Status: DC
  Administered 2019-02-01: 1 via ORAL

## 2019-02-01 NOTE — Progress Notes (Signed)
Initial Nutrition Assessment  DOCUMENTATION CODES:   Severe malnutrition in context of acute illness/injury  INTERVENTION:  Once diet advances past clear liquids recommend providing Ensure Enlive po BID, each supplement provides 350 kcal and 20 grams of protein.  Monitor magnesium, potassium, and phosphorus daily for at least 3 days, MD to replete as needed, as pt is at risk for refeeding syndrome given severe acute malnutrition, report of poor PO intake for approximately 3 weeks prior to admission.  Discussed increased needs for calories and protein to prevent further loss of lean body mass and to begin rebuilding lean body mass that has been lost. Encouraged adequate intake of calories and protein at meals once he is able to tolerate PO intake well.  NUTRITION DIAGNOSIS:   Severe Malnutrition related to acute illness(mass in right colon, abdominal pain, inadequate oral intake) as evidenced by energy intake < or equal to 50% for > or equal to 5 days, mild-moderate fat depletion, moderate muscle depletion, 12.4% weight loss over the past 2.5 months, per patient/family report.  GOAL:   Patient will meet greater than or equal to 90% of their needs  MONITOR:   PO intake, Diet advancement, Supplement acceptance, Labs, Weight trends, Skin, I & O's  REASON FOR ASSESSMENT:   Malnutrition Screening Tool    ASSESSMENT:   38 year old male with PMHx of asthma, GERD, chronic abdominal pain since 2018, hx diverticulitis admitted with inflammatory mass in right colon s/p right extended hemicolectomy, drainage of pelvic abscess, drainage of right peritoneal abscess, and repair of umbilical hernia on 92/33.   Met with patient and his mother at bedside. Patient reports that for about 3 weeks PTA he had a poor appetite and intake. He was only taking in some sips of liquids or most days reports he did not have anything to eat or drink at all. During those 3 weeks he reports he had one small meal where  he had some bites of pork but mainly was not eating any solids. He was started on clear liquids this morning and reports he has been tolerating so far. He had a bowel movement today.  Patient reports his UBW was 175-180 lbs but according to chart he weighed closer to 190 lbs earlier this year. He was 88.2 kg on 11/13/2018 (194.04 lbs). RD obtained bed scale weight of 77.3 kg (170.42 lbs) today. He has lost 10.9 kg (12.4% body weight) over the past 2.5 months, which is significant for time frame.  Medications reviewed and include: pantoprazole, NS at 125 mL/hr, Zosyn, vancomycin.  Labs reviewed: BUN 22, Magnesium 2.7.  NUTRITION - FOCUSED PHYSICAL EXAM:    Most Recent Value  Orbital Region  Moderate depletion  Upper Arm Region  Mild depletion  Thoracic and Lumbar Region  Mild depletion  Buccal Region  Moderate depletion  Temple Region  Moderate depletion  Clavicle Bone Region  Moderate depletion  Clavicle and Acromion Bone Region  Moderate depletion  Scapular Bone Region  Mild depletion  Dorsal Hand  No depletion  Patellar Region  No depletion  Anterior Thigh Region  No depletion  Posterior Calf Region  No depletion  Edema (RD Assessment)  None  Hair  Reviewed  Eyes  Reviewed  Mouth  Reviewed  Skin  Reviewed  Nails  Reviewed     Diet Order:   Diet Order            Diet clear liquid Room service appropriate? Yes; Fluid consistency: Thin  Diet effective now  EDUCATION NEEDS:   Education needs have been addressed  Skin:  Skin Assessment: Skin Integrity Issues:(closed incision to abdomen)  Last BM:  02/01/2019 - medium type 7  Height:   Ht Readings from Last 1 Encounters:  01/30/19 '6\' 1"'  (1.854 m)   Weight:   Wt Readings from Last 1 Encounters:  02/01/19 77.3 kg   Ideal Body Weight:  83.6 kg  BMI:  Body mass index is 22.48 kg/m.  Estimated Nutritional Needs:   Kcal:  2100-2400 (MSJ x 1.2-1.4)  Protein:  100-115 grams (1.3-1.5 grams/kg)  Fluid:   2.3-2.7 L/day  Jacklynn Barnacle, MS, RD, LDN Office: [phone] Pager: [phone] After Hours/Weekend Pager: [phone]

## 2019-02-01 NOTE — Consult Note (Signed)
Pharmacy Antibiotic Note  Tristan [name] is a 38 y.o. male admitted on 01/30/2019 with Pelvic abscess.  Pharmacy has been consulted for vancomycin dosing.  Plan: Will give a vancomycin 1500 mg loading dose followed by a 1250 mg IV every 8 hours maintenance dose. Predicted AUC 501 with a goal AUC of 400-550. Css min 14.4. Scr used. 0.75. Plan to order vancomycin levels prior to the 5th dose.    11/21 @ 16:50 Vanc Peak = 25 11/21 @ 21:30 Vanc Trough = 43 11/22 @ 05:19 Vanc Random = 20  11/22  Will adjust dose to Vancomycin 1250 mg IV every 12 hours.   Height: 6\' 1"  (185.4 cm) Weight: 175 lb (79.4 kg) IBW/kg (Calculated) : 79.9  Temp (24hrs), Avg:97.9 F (36.6 C), Min:97.6 F (36.4 C), Max:98.1 F (36.7 C)  Recent Labs  Lab 01/30/19 1451 01/31/19 0709 01/31/19 0852 01/31/19 1650 01/31/19 2128 02/01/19 0519  WBC 14.3* 12.2*  --   --   --  9.1  CREATININE 1.05 1.12 1.21  --   --  0.97  VANCOTROUGH  --   --   --   --  43*  --   VANCOPEAK  --   --   --  25*  --   --   VANCORANDOM  --   --   --   --   --  20    Estimated Creatinine Clearance: 117.1 mL/min (by C-G formula based on SCr of 0.97 mg/dL).    No Known Allergies  Antimicrobials this admission: 11/20 vancomycin >>   Microbiology results: 11/20 Wound Cx: pending Gram stain: MANY GRAM POSITIVE COCCI, FEW GRAM NEGATIVE RODS, FEW GRAM POSITIVE RODS   Thank you for allowing pharmacy to be a part of this patient's care.  [name], [facility] 02/01/2019 10:32 AM

## 2019-02-01 NOTE — Progress Notes (Signed)
CC: s/p Right colectomy Subjective: Doing well  hb 5.7 from aggressive fluid resuscitation from his Sirs response due to the abscess and perforated colon mass. He is hemodynamics have been well.  He feels okay.  Questionable flatus. Wbc down  Objective: Vital signs in last 24 hours: Temp:  [97.6 F (36.4 C)-98.5 F (36.9 C)] 98.5 F (36.9 C) (11/22 1322) Pulse Rate:  [79-93] 88 (11/22 1322) Resp:  [16-20] 18 (11/22 1322) BP: (94-104)/(57-59) 103/59 (11/22 1322) SpO2:  [100 %] 100 % (11/22 1322) Weight:  [77.3 kg] 77.3 kg (11/22 1346) Last BM Date: 01/30/19  Intake/Output from previous day: 11/21 0701 - 11/22 0700 In: 2425.7 [I.V.:1894.1; IV Piggyback:461.6] Out: [id number] [Urine:1240; Emesis/NG output:125; Drains:120] Intake/Output this shift: Total I/O In: -  Out: 370 [Urine:250; Emesis/NG output:100; Drains:20]  Physical exam:  NAD, alert Abd: soft, incision open w/o infection jp serous draiange  Lab Results: CBC  Recent Labs    01/31/19 0709 02/01/19 0519  WBC 12.2* 9.1  HGB 6.9* 5.7*  HCT 22.3* 19.1*  PLT 359 351   BMET Recent Labs    01/31/19 0852 02/01/19 0519  NA 133* 137  K 4.8 3.9  CL 97* 103  CO2 25 23  GLUCOSE 132* 92  BUN 24* 22*  CREATININE 1.21 0.97  CALCIUM 7.3* 7.4*   PT/INR No results for input(s): LABPROT, INR in the last 72 hours. ABG No results for input(s): PHART, HCO3 in the last 72 hours.  Invalid input(s): PCO2, PO2  Studies/Results: No results found.  Anti-infectives: Anti-infectives (From admission, onward)   Start     Dose/Rate Route Frequency Ordered Stop   02/01/19 1130  vancomycin (VANCOCIN) 1,250 mg in sodium chloride 0.9 % 250 mL IVPB  Status:  Discontinued     1,250 mg 166.7 mL/hr over 90 Minutes Intravenous Every 12 hours 02/01/19 1035 02/01/19 1106   02/01/19 1130  vancomycin (VANCOCIN) 1,250 mg in sodium chloride 0.9 % 250 mL IVPB     1,250 mg 166.7 mL/hr over 90 Minutes Intravenous Every 12 hours 02/01/19  1106     01/31/19 2218  vancomycin variable dose per unstable renal function (pharmacist dosing)  Status:  Discontinued      Does not apply See admin instructions 01/31/19 2218 02/01/19 1106   01/30/19 2200  vancomycin (VANCOCIN) 1,250 mg in sodium chloride 0.9 % 250 mL IVPB  Status:  Discontinued     1,250 mg 166.7 mL/hr over 90 Minutes Intravenous Every 8 hours 01/30/19 1157 01/31/19 2218   01/30/19 1700  piperacillin-tazobactam (ZOSYN) IVPB 3.375 g     3.375 g 12.5 mL/hr over 240 Minutes Intravenous Every 8 hours 01/30/19 1423     01/30/19 1500  vancomycin (VANCOCIN) 1,500 mg in sodium chloride 0.9 % 500 mL IVPB     1,500 mg 250 mL/hr over 120 Minutes Intravenous  Once 01/30/19 1156 01/30/19 1655   01/30/19 1200  vancomycin (VANCOCIN) 1,250 mg in sodium chloride 0.9 % 250 mL IVPB  Status:  Discontinued     1,250 mg 166.7 mL/hr over 90 Minutes Intravenous Every 8 hours 01/30/19 1156 01/30/19 1156   01/30/19 0855  piperacillin-tazobactam (ZOSYN) 3.375 GM/50ML IVPB    Note to Pharmacy: Ronnell Freshwater   : cabinet override      01/30/19 0855 01/30/19 0932   01/30/19 0600  cefoTEtan (CEFOTAN) 2 g in sodium chloride 0.9 % 100 mL IVPB     2 g 200 mL/hr over 30 Minutes Intravenous On call to O.R. 01/30/19  0011 01/30/19 [id number]      Assessment/Plan: Transfuse 1 unit RBC continue a/bs Mobilize Wound vac tomorrow Dc ngt start clears  Caroleen Hamman, MD, FACS  02/01/2019

## 2019-02-01 NOTE — Progress Notes (Signed)
Primary nurse noted that pt Hgb was 5.7 this AM. Primary nurse spoke to Dr. Dahlia Byes. Orders received to transfuse 1 unit. Report given to [facility].

## 2019-02-02 ENCOUNTER — Encounter: Payer: Self-pay | Admitting: Gastroenterology

## 2019-02-02 LAB — TYPE AND SCREEN
ABO/RH(D): O POS
Antibody Screen: NEGATIVE
Unit division: 0

## 2019-02-02 LAB — BASIC METABOLIC PANEL
Anion gap: 9 (ref 5–15)
BUN: 12 mg/dL (ref 6–20)
CO2: 23 mmol/L (ref 22–32)
Calcium: 7.6 mg/dL — ABNORMAL LOW (ref 8.9–10.3)
Chloride: 105 mmol/L (ref 98–111)
Creatinine, Ser: 0.89 mg/dL (ref 0.61–1.24)
GFR calc Af Amer: >60 mL/min (ref >60)
GFR calc non Af Amer: >60 mL/min (ref >60)
Glucose, Bld: 92 mg/dL (ref 70–99)
Potassium: 3.8 mmol/L (ref 3.5–5.1)
Sodium: 137 mmol/L (ref 135–145)

## 2019-02-02 LAB — CBC
HCT: 20.1 % — ABNORMAL LOW (ref 39.0–52.0)
Hemoglobin: 6.6 g/dL — ABNORMAL LOW (ref 13.0–17.0)
MCH: 22.9 pg — ABNORMAL LOW (ref 26.0–34.0)
MCHC: 32.8 g/dL (ref 30.0–36.0)
MCV: 69.8 fL — ABNORMAL LOW (ref 80.0–100.0)
Platelets: 311 10*3/uL (ref 150–400)
RBC: 2.88 MIL/uL — ABNORMAL LOW (ref 4.22–5.81)
RDW: 19.7 % — ABNORMAL HIGH (ref 11.5–15.5)
WBC: 6.4 10*3/uL (ref 4.0–10.5)
nRBC: 0 % (ref 0.0–0.2)

## 2019-02-02 LAB — BPAM RBC
Blood Product Expiration Date: 202011242359
ISSUE DATE / TIME: 202011221258
Unit Type and Rh: 9500

## 2019-02-02 MED ORDER — FUROSEMIDE 10 MG/ML IJ SOLN
20.0000 mg | Freq: Once | INTRAMUSCULAR | Status: AC
  Administered 2019-02-02: 20 mg via INTRAVENOUS
  Filled 2019-02-02: qty 4

## 2019-02-02 MED ORDER — SODIUM CHLORIDE 0.9 % IV SOLN
INTRAVENOUS | Status: DC | PRN
  Administered 2019-02-02 – 2019-02-06 (×3): 250 mL via INTRAVENOUS
  Administered 2019-02-06: 30 mL via INTRAVENOUS
  Administered 2019-02-07 – 2019-02-08 (×2): 250 mL via INTRAVENOUS

## 2019-02-02 NOTE — Consult Note (Signed)
WOC Nurse Consult Note: Reason for Consult:Apply midline NPWT Wound type: Surgical Pressure Injury POA: N/A Measurement: 17.5cm x 5.5cm x 3.5cm Wound bed: Pale red, dry Drainage (amount, consistency, odor) Small amount serous Periwound: intact.  JP drain at 7 o'clock, umbilicus at 10 o'clock Dressing procedure/placement/frequency: Wound cleansed with NS, left intentionally moist.  Periwound skin dried and protected at most distal end of incision and at umbilicus with pieces of skin barrier ring. 1 piece of black foam used to obliterate dead space, covered with drape. T.R.A.C. pad attached and 158mmHg continuous negative pressure applied.  An immediate seal is achieved. Patient expressed discomfort and the bedside RN administered analgesia.  Next scheduled dressing change is for Wednesday, November 25. Patient is not sure he wishes to continue with NPWT dressings. He is advised to discuss this with his physician/surgeon when he sees them next.  If continuation of NPWT is desired, please order arrange with Case Management along with HHRN.  The assistance of the Bedside RN is appreciated during this dressing change.  [place] nursing team will follow for subsequent NPWT dressing changes while in house, and will remain available to this patient, the nursing and medical teams.   Thanks, Maudie Flakes, MSN, RN, [place], Arther Abbott  Pager# [phone]

## 2019-02-02 NOTE — Progress Notes (Signed)
POD 3 Hb increase appropriately HD adequate avss Hb 6.6 creat 0.9 Doing well Reports some flatus Taking clears no N/V jp 160cc ambulated  PE NAD Abd: soft, wound healing well. JP serous fluid Ext: well perfused  A/p Doing well Anemia of chronic disease combined w recent surgery and hemodilution from resuscitation We will recheck hb in am, no HD changes Keep clears for now Wound vac to abd wound DC planning

## 2019-02-03 LAB — BASIC METABOLIC PANEL
Anion gap: 9 (ref 5–15)
BUN: 11 mg/dL (ref 6–20)
CO2: 26 mmol/L (ref 22–32)
Calcium: 8.1 mg/dL — ABNORMAL LOW (ref 8.9–10.3)
Chloride: 103 mmol/L (ref 98–111)
Creatinine, Ser: 1.71 mg/dL — ABNORMAL HIGH (ref 0.61–1.24)
GFR calc Af Amer: 58 mL/min — ABNORMAL LOW (ref >60)
GFR calc non Af Amer: 50 mL/min — ABNORMAL LOW (ref >60)
Glucose, Bld: 98 mg/dL (ref 70–99)
Potassium: 4.7 mmol/L (ref 3.5–5.1)
Sodium: 138 mmol/L (ref 135–145)

## 2019-02-03 LAB — CBC
HCT: 22.5 % — ABNORMAL LOW (ref 39.0–52.0)
Hemoglobin: 7 g/dL — ABNORMAL LOW (ref 13.0–17.0)
MCH: 22.3 pg — ABNORMAL LOW (ref 26.0–34.0)
MCHC: 31.1 g/dL (ref 30.0–36.0)
MCV: 71.7 fL — ABNORMAL LOW (ref 80.0–100.0)
Platelets: 347 10*3/uL (ref 150–400)
RBC: 3.14 MIL/uL — ABNORMAL LOW (ref 4.22–5.81)
RDW: 20.1 % — ABNORMAL HIGH (ref 11.5–15.5)
WBC: 8.3 10*3/uL (ref 4.0–10.5)
nRBC: 0 % (ref 0.0–0.2)

## 2019-02-03 LAB — VANCOMYCIN, RANDOM: Vancomycin Rm: 23

## 2019-02-03 MED ORDER — ENSURE ENLIVE PO LIQD
237.0000 mL | Freq: Two times a day (BID) | ORAL | Status: DC
  Administered 2019-02-03: 237 mL via ORAL

## 2019-02-03 NOTE — Progress Notes (Addendum)
[place] Hospital Day(s): 4.   Post op day(s): 4 Days Post-Op.   Interval History:  Patient seen and examined no acute events or new complaints overnight.  Patient reports he is feeling better this morning. He has expected post surgical soreness. No fever, chills, nausea, or emesis.  Hgb - 7.0, received 1 unit pRBCs on 11/23 Slight bump in sCr to 1.71, u/o ~ 1L JP right lateral - 45, JP right midline - 35 On CLD, which he is tolerating well Mobilizing well   Vital signs in last 24 hours: [min-max] current  Temp:  [97.9 F (36.6 C)-98.3 F (36.8 C)] 97.9 F (36.6 C) (11/24 0431) Pulse Rate:  [79-83] 81 (11/24 0431) Resp:  [16] 16 (11/23 1951) BP: (107-112)/(66-71) 112/66 (11/24 0431) SpO2:  [97 %-100 %] 100 % (11/24 0431)     Height: 6\' 1"  (185.4 cm) Weight: 77.3 kg(bed scale) BMI (Calculated): 22.49   Intake/Output last 2 shifts:  11/23 0701 - 11/24 0700 In: 846.4 [I.V.:478.4; IV Piggyback:368] Out: 1030 [Urine:950; Drains:80]   Physical Exam:  Constitutional: alert, cooperative and no distress  Respiratory: breathing non-labored at rest  Cardiovascular: regular rate and sinus rhythm  Gastrointestinal: soft,incisional soreness, and non-distended. No rebound/guarding. JP x2 in right abdomen, both with serosanguinous output Integumentary: Wound vac to midline wound, good seal  Labs:  CBC Latest Ref Rng & Units 02/03/2019 02/02/2019 02/01/2019  WBC 4.0 - 10.5 K/uL 8.3 6.4 9.1  Hemoglobin 13.0 - 17.0 g/dL 7.0(L) 6.6(L) 5.7(L)  Hematocrit 39.0 - 52.0 % 22.5(L) 20.1(L) 19.1(L)  Platelets 150 - 400 K/uL 347 311 351   CMP Latest Ref Rng & Units 02/03/2019 02/02/2019 02/01/2019  Glucose 70 - 99 mg/dL 98 92 92  BUN 6 - 20 mg/dL 11 12 22(H)  Creatinine 0.61 - 1.24 mg/dL 1.71(H) 0.89 0.97  Sodium 135 - 145 mmol/L 138 137 137  Potassium 3.5 - 5.1 mmol/L 4.7 3.8 3.9  Chloride 98 - 111 mmol/L 103 105 103  CO2 22 - 32 mmol/L 26 23 23    Calcium 8.9 - 10.3 mg/dL 8.1(L) 7.6(L) 7.4(L)  Total Protein 6.5 - 8.1 g/dL - - 5.6(L)  Total Bilirubin 0.3 - 1.2 mg/dL - - 0.4  Alkaline Phos 38 - 126 U/L - - 37(L)  AST 15 - 41 U/L - - 23  ALT 0 - 44 U/L - - 23    Imaging studies: No new pertinent imaging studies   Assessment/Plan:  38 y.o. male with anemia of chronic disease + hemodilution s/p transfusion on 11/23 otherwise doing well 4 Days Post-Op s/p right extended hemicolectomy for right colonic mass.   - Advance to full liquid diet  - Monitor Hgb; transfuse as needed  - Monitor renal function, making good urine  - pain control prn; antiemetics prn  - Monitor abdominal examination; on-going bowel function  - Continue wound vac; MWF schedule; arrange for [facility]    - Mobilization encouraged   - Medical management of comorbidities    All of the above findings and recommendations were discussed with the patient, and the medical team, and all of patient's questions were answered to his expressed satisfaction.  -- Edison Simon, PA-C [facility] 02/03/2019, 7:29 AM [phone] M-F: 7am - 4pm

## 2019-02-03 NOTE — Consult Note (Signed)
Pharmacy Antibiotic Note  Tristan [name] is a 38 y.o. male admitted on 01/30/2019 with a pelvic abscess.  Pharmacy has been consulted for vancomycin dosing. He is 4 Days Post-Op s/p right extended hemicolectomy for right colonic mass. This is day # 5 of IV antibiotics. Today his SCr more than doubled from yesterday. His last dose of vancomycin was 1250mg  11/23 at 2315.  Plan: 1)  change vancomycin dose to 750 mg IV every 12 hours maintenance dose  Predicted AUC 451.8 with a goal AUC of 400-550   Css 26.4/13.7 mcg/mL   SCr used 1.71  T1/2: 11.6h  Vancomycin random level prior to next scheduled dose  SCr in am    2) continue Zosyn 3.375 g EI every 8 hours (over 4 hours)  Height: 6\' 1"  (185.4 cm) Weight: 170 lb 6.7 oz (77.3 kg)(bed scale) IBW/kg (Calculated) : 79.9  Temp (24hrs), Avg:98.1 F (36.7 C), Min:97.9 F (36.6 C), Max:98.3 F (36.8 C)  Recent Labs  Lab 01/30/19 1451 01/31/19 0709 01/31/19 0852 01/31/19 1650 01/31/19 2128 02/01/19 0519 02/02/19 0425 02/03/19 0444  WBC 14.3* 12.2*  --   --   --  9.1 6.4 8.3  CREATININE 1.05 1.12 1.21  --   --  0.97 0.89 1.71*  VANCOTROUGH  --   --   --   --  43*  --   --   --   VANCOPEAK  --   --   --  25*  --   --   --   --   VANCORANDOM  --   --   --   --   --  20  --   --     Estimated Creatinine Clearance: 64.7 mL/min (A) (by C-G formula based on SCr of 1.71 mg/dL (H)).    No Known Allergies  Antimicrobials this admission: 11/20 vancomycin >>   Microbiology results: 11/20 Wound Cx: pending Gram stain: MANY GRAM POSITIVE COCCI, FEW GRAM NEGATIVE RODS, FEW GRAM POSITIVE RODS   Thank you for allowing pharmacy to be a part of this patient's care.  Dallie Piles, [facility] 02/03/2019 1:11 PM

## 2019-02-03 NOTE — TOC Initial Note (Signed)
Transition of Care [facility]) - Initial/Assessment Note    Patient Details  Name: Tristan [name] MRN: [id number] Date of Birth: 10/31/1980  Transition of Care [facility]) CM/SW Contact:    Beverly Sessions, RN Phone Number: 02/03/2019, 3:41 PM  Clinical Narrative:                 Patient POD 4 right extended hemicolectomy  Patient lives at home with his mother and children Mother is at bedside  Patient states that he is employed [facility]acy - [facility]-  Denies issues obtaining medications  Patient states that he does not have a PCP.  Agreeable for RN to set up new patient appointment.  Does not have preference of practice.  Per his insurance website there are several providers in network at [place] has left message at front desk to make an appointment. Awaiting return call  Per MD patient will require wound vac at discharge.  RNCM obtained copy of insurance card from patient, copy labeled and put in patient's chart.  Patient has [employer].  Referral made with Brad at [facility].  He states they are in network with insurance, and will obtain co pay information  RNCM checked with preferred providers for home health.  None were in network with patients insurance.  Via patient insurance portal it shoes  That [facility], [facility], and [facility] are the 3 companies in network. RNCM unable to reach any one at [facility].  Per [facility] they do not have the staffing for vac change.   RNCM spoke with Christina at [facility].  They are able to accept the referral . Anticipated discharge date is Friday.  Margreta Journey confirms someone would be able to come to the home Monday for vac changes.  Assigned case worker [place] N[place], [phone] [email or web address].  Thedore Mins PA updated with above information    Expected Discharge Plan: [place] Barriers to Discharge: Continued Medical Work up   Patient Goals and CMS Choice   CMS Medicare.gov Compare Post Acute Care list provided to::  Patient Choice offered to / list presented to : Patient  Expected Discharge Plan and Services Expected Discharge Plan: [place]   Discharge Planning Services: CM Consult Post Acute Care Choice: [place] arrangements for the past 2 months: Single Family Home                 DME Arranged: Vac DME Agency: [employer] Date DME Agency Contacted: 02/03/19 Time DME Agency Contacted: [id number] Representative spoke with at DME Agency: [place]: RN [place] Agency: Other - See comment([facility]) Date [place]: 02/03/19   Representative spoke with at [place]  Prior Living Arrangements/Services Living arrangements for the past 2 months: [place] with:: Parents, Minor Children Patient language and need for interpreter reviewed:: Yes Do you feel safe going back to the place where you live?: Yes      Need for Family Participation in Patient Care: Yes (Comment) Care giver support system in place?: Yes (comment)   Criminal Activity/Legal Involvement Pertinent to Current Situation/Hospitalization: No - Comment as needed  Activities of Daily Living Home Assistive Devices/Equipment: None ADL Screening (condition at time of admission) Patient's cognitive ability adequate to safely complete daily activities?: Yes Is the patient deaf or have difficulty hearing?: No Does the patient have difficulty seeing, even when wearing glasses/contacts?: No Does the patient have difficulty concentrating, remembering, or making decisions?: No Patient able to express need for  assistance with ADLs?: Yes Does the patient have difficulty dressing or bathing?: No Independently performs ADLs?: Yes (appropriate for developmental age) Does the patient have difficulty walking or climbing stairs?: No Weakness of Legs: Both Weakness of Arms/Hands: Both  Permission Sought/Granted                  Emotional Assessment Appearance:: Appears stated  age     Orientation: : Oriented to Self, Oriented to Place, Oriented to Situation, Oriented to  Time   Psych Involvement: No (comment)  Admission diagnosis:  Colon Mass ABN CT COLON MASS Patient Active Problem List   Diagnosis Date Noted  . Protein-calorie malnutrition, severe 02/01/2019  . Mass of colon 01/30/2019  . Genital warts 07/15/2015   PCP:  Patient, No Pcp Per Pharmacy:   [id number] [place], [place] [place] [address] Phone: [phone] Fax: [phone]     Social Determinants of Health (SDOH) Interventions    Readmission Risk Interventions No flowsheet data found.

## 2019-02-04 ENCOUNTER — Other Ambulatory Visit: Payer: Self-pay | Admitting: Anatomic Pathology & Clinical Pathology

## 2019-02-04 ENCOUNTER — Other Ambulatory Visit: Payer: Self-pay

## 2019-02-04 LAB — CBC
HCT: 20.6 % — ABNORMAL LOW (ref 39.0–52.0)
Hemoglobin: 6.2 g/dL — ABNORMAL LOW (ref 13.0–17.0)
MCH: 22.5 pg — ABNORMAL LOW (ref 26.0–34.0)
MCHC: 30.1 g/dL (ref 30.0–36.0)
MCV: 74.6 fL — ABNORMAL LOW (ref 80.0–100.0)
Platelets: 334 10*3/uL (ref 150–400)
RBC: 2.76 MIL/uL — ABNORMAL LOW (ref 4.22–5.81)
RDW: 20.5 % — ABNORMAL HIGH (ref 11.5–15.5)
WBC: 7.6 10*3/uL (ref 4.0–10.5)
nRBC: 0 % (ref 0.0–0.2)

## 2019-02-04 LAB — BASIC METABOLIC PANEL
Anion gap: 8 (ref 5–15)
BUN: 11 mg/dL (ref 6–20)
CO2: 26 mmol/L (ref 22–32)
Calcium: 7.8 mg/dL — ABNORMAL LOW (ref 8.9–10.3)
Chloride: 105 mmol/L (ref 98–111)
Creatinine, Ser: 1.99 mg/dL — ABNORMAL HIGH (ref 0.61–1.24)
GFR calc Af Amer: 48 mL/min — ABNORMAL LOW (ref >60)
GFR calc non Af Amer: 42 mL/min — ABNORMAL LOW (ref >60)
Glucose, Bld: 102 mg/dL — ABNORMAL HIGH (ref 70–99)
Potassium: 4 mmol/L (ref 3.5–5.1)
Sodium: 139 mmol/L (ref 135–145)

## 2019-02-04 LAB — AEROBIC/ANAEROBIC CULTURE W GRAM STAIN (SURGICAL/DEEP WOUND): Gram Stain: NONE SEEN

## 2019-02-04 LAB — VANCOMYCIN, RANDOM: Vancomycin Rm: 17

## 2019-02-04 MED ORDER — VANCOMYCIN HCL IN DEXTROSE 1-5 GM/200ML-% IV SOLN
1000.0000 mg | Freq: Once | INTRAVENOUS | Status: AC
  Administered 2019-02-04: 1000 mg via INTRAVENOUS
  Filled 2019-02-04: qty 200

## 2019-02-04 NOTE — Consult Note (Signed)
WOC Nurse Consult Note: Patient receiving care in [facility] 212. Reason for Consult: Abdominal VAC dressing change Wound type: surgical Measurement:18 cm x 4.9 cm x 3 cm Wound bed: 100% pink, clean, granulation tissue Drainage (amount, consistency, odor) serosanginous in cannister Periwound: intact Dressing procedure/placement/frequency: All black foam removed from wound bed. 1 piece of black foam placed into wound. One barrier ring used along the inferior edge and right of the umbilicus. Immediate seal obtained. Patient tolerated the change very well. Val Riles, RN, MSN, CWOCN, CNS-BC, pager [phone]

## 2019-02-04 NOTE — TOC Progression Note (Signed)
Transition of Care [facility]) - Progression Note    Patient Details  Name: Tristan [name] MRN: [id number] Date of Birth: [birth year]  Transition of Care [facility]) CM/SW Contact  Beverly Sessions, RN Phone Number: 02/04/2019, 5:07 PM  Clinical Narrative:    Notified by [employer] that patient's insurance does not cover DME.  Out of pocket approximately $3000 a month.  Per [employer] they can not offer [place] reached out of [place] with [employer].  They can potentially provide a charity vac.  If patient not approved then he would have to pay $125 per week, and pay 2 weeks upfront  Charity form and vac order faxed to [place] call from [place] at [facility] link.  She states "it is a slim chance that we will be able to staff the case, today is our Friday, so we would not know anything till next week" Another barrier is that patient has not met his deducible so each home health visit would be $130 out of pocket. This information was provided to MD, PA, patient, and mother  Per MD plan currently is as follows  Do not pursue vac. Plan to discharge friday with wet to dry. Patient and mother will learn. If patient is able to afford the $130 per visit. and if the home health agency is able to staff next week they will notify patient. if not patient will continue to do the wet to dry and follow up in the office.  Patient and mother updated with the plan  Home health orders and HP faxed to [facility] at home link [phone]  Expected Discharge Plan: [place] Barriers to Discharge: Continued Medical Work up  Expected Discharge Plan and Services Expected Discharge Plan: [place]   Discharge Planning Services: CM Consult Post Acute Care Choice: [place] arrangements for the past 2 months: Single Family Home                 DME Arranged: Vac DME Agency: [employer] Date DME Agency Contacted: 02/03/19 Time DME Agency Contacted: 55 Representative spoke with  at DME Agency: [place]: RN [place] Agency: Other - See comment([facility]) Date [place]: 02/03/19   Representative spoke with at [place]: [place] ([place]) Interventions    Readmission Risk Interventions No flowsheet data found.

## 2019-02-04 NOTE — Progress Notes (Signed)
Nutrition Follow Up Note   DOCUMENTATION CODES:   Severe malnutrition in context of acute illness/injury  INTERVENTION:   Ensure Enlive po BID, each supplement provides 350 kcal and 20 grams of protein  Magic cup TID with meals, each supplement provides 290 kcal and 9 grams of protein  Pt at high refeed risk; recommend monitor K, Mg and P labs   NUTRITION DIAGNOSIS:   Severe Malnutrition related to acute illness(mass in right colon, abdominal pain, inadequate oral intake) as evidenced by energy intake < or equal to 50% for > or equal to 5 days, mild-moderate fat depletion, moderate muscle depletion, 12.4% weight loss over the past 2.5 months, per patient/family report.  GOAL:   Patient will meet greater than or equal to 90% of their needs  -progressing   MONITOR:   PO intake, Diet advancement, Supplement acceptance, Labs, Weight trends, Skin, I & O's  ASSESSMENT:   38 year old male with PMHx of asthma, GERD, chronic abdominal pain since 2018, hx diverticulitis admitted with inflammatory mass in right colon s/p right extended hemicolectomy, drainage of pelvic abscess, drainage of right peritoneal abscess, and repair of umbilical hernia on [id number].   Pt advanced to full liquid diet yesterday. Pt with fair appetite and oral intake; pt is refusing most Ensure. RD will add Magic Cups to meal trays. Pt is at high refeed risk; recommend monitor electrolytes. Wound with VAC in place. No new weight since admit; recommend obtain new weight.   Medications reviewed and include: protonix, NaCl @40ml /hr, zosyn, vancomycin   Labs reviewed: creat 1.99(H) P 3.4 wnl, Mg 2.7(H)- 11/22 Hgb 6.2(L), Hct 20.6(L), MCV 74.6(L), MCH 22.5(L)  Diet Order:   Diet Order            Diet full liquid Room service appropriate? Yes; Fluid consistency: Thin  Diet effective now             EDUCATION NEEDS:   Education needs have been addressed  Skin:  Skin Assessment: Skin Integrity Issues:(closed  incision to abdomen) 17.5cm x 5.5cm x 3.5cm, VAC  Last BM:  11/24- TYPE 7  Height:   Ht Readings from Last 1 Encounters:  01/30/19 6\' 1"  (1.854 m)   Weight:   Wt Readings from Last 1 Encounters:  02/01/19 77.3 kg   Ideal Body Weight:  83.6 kg  BMI:  Body mass index is 22.48 kg/m.  Estimated Nutritional Needs:   Kcal:  2100-2400 (MSJ x 1.2-1.4)  Protein:  100-115 grams (1.3-1.5 grams/kg)  Fluid:  2.3-2.7 L/day  Koleen Distance MS, RD, LDN Pager #- [phone] Office#- [phone] After Hours Pager: [phone]

## 2019-02-04 NOTE — Progress Notes (Signed)
Informed Dr. Dahlia Byes of 6.2 HMG

## 2019-02-04 NOTE — Progress Notes (Signed)
[place] Hospital Day(s): 5.   Post op day(s): 5 Days Post-Op.   Interval History: Patient seen and examined no acute events or new complaints overnight.  Patient reports he is feeling okay this morning Abdominal soreness expected, no fever, chills, or emesis. He does think he ate too much yesterday and had some nausea but this resolved.  Hgb - 6.2 Continued bump in sCr 1.99, measured U/O 340 JP drains remain serosanguinous Full liquid diet yesterday, tolerating well BM x3 yesterday Mobilizing   Vital signs in last 24 hours: [min-max] current  Temp:  [97.8 F (36.6 C)-98.3 F (36.8 C)] 97.8 F (36.6 C) (11/25 0501) Pulse Rate:  [75-84] 75 (11/25 0501) Resp:  [18] 18 (11/24 1158) BP: (100-123)/(59-74) 100/59 (11/25 0501) SpO2:  [99 %-100 %] 99 % (11/25 0501)     Height: 6\' 1"  (185.4 cm) Weight: 77.3 kg(bed scale) BMI (Calculated): 22.49   Intake/Output last 2 shifts:  11/24 0701 - 11/25 0700 In: 178.7 [I.V.:56.4; IV Piggyback:122.3] Out: 410 [Urine:340; Drains:70]   Physical Exam:  Constitutional: alert, cooperative and no distress  Respiratory: breathing non-labored at rest  Cardiovascular: regular rate and sinus rhythm  Gastrointestinal: soft, incisional soreness, and non-distended. No rebound/guarding. JP x2 in right abdomen, both with serosanguinous output Integumentary: Wound vac to midline wound, good seal  Labs:  CBC Latest Ref Rng & Units 02/04/2019 02/03/2019 02/02/2019  WBC 4.0 - 10.5 K/uL 7.6 8.3 6.4  Hemoglobin 13.0 - 17.0 g/dL 6.2(L) 7.0(L) 6.6(L)  Hematocrit 39.0 - 52.0 % 20.6(L) 22.5(L) 20.1(L)  Platelets 150 - 400 K/uL 334 347 311   CMP Latest Ref Rng & Units 02/04/2019 02/03/2019 02/02/2019  Glucose 70 - 99 mg/dL 102(H) 98 92  BUN 6 - 20 mg/dL 11 11 12   Creatinine 0.61 - 1.24 mg/dL 1.99(H) 1.71(H) 0.89  Sodium 135 - 145 mmol/L 139 138 137  Potassium 3.5 - 5.1 mmol/L 4.0 4.7 3.8  Chloride 98 - 111 mmol/L  105 103 105  CO2 22 - 32 mmol/L 26 26 23   Calcium 8.9 - 10.3 mg/dL 7.8(L) 8.1(L) 7.6(L)  Total Protein 6.5 - 8.1 g/dL - - -  Total Bilirubin 0.3 - 1.2 mg/dL - - -  Alkaline Phos 38 - 126 U/L - - -  AST 15 - 41 U/L - - -  ALT 0 - 44 U/L - - -    Imaging studies: No new pertinent imaging studies   Assessment/Plan:  38 y.o. male with anemia of chronic disease vs hemodilution and slight bump in renal function this morning 5 Days Post-Op s/p right extended hemicolectomy for right colonic mass.   - Continue full liquid diet - ADAT             - Monitor Hgb; transfuse as needed - closely monitor he is low at baseline             - Monitor renal function, making good urine - ? Hypoperfusion poss given anemia             - pain control prn; antiemetics prn             - Monitor abdominal examination; on-going bowel function             - Continue wound vac; MWF schedule; arrange for [facility]               - Mobilization encouraged              -  Medical management of comorbidities    All of the above findings and recommendations were discussed with the patient, and the medical team, and all of patient's questions were answered to his expressed satisfaction.  -- Edison Simon, PA-C [facility] 02/04/2019, 7:18 AM [phone] M-F: 7am - 4pm

## 2019-02-05 ENCOUNTER — Inpatient Hospital Stay: Payer: PRIVATE HEALTH INSURANCE

## 2019-02-05 LAB — CBC
HCT: 20.1 % — ABNORMAL LOW (ref 39.0–52.0)
Hemoglobin: 6.4 g/dL — ABNORMAL LOW (ref 13.0–17.0)
MCH: 22.8 pg — ABNORMAL LOW (ref 26.0–34.0)
MCHC: 31.8 g/dL (ref 30.0–36.0)
MCV: 71.5 fL — ABNORMAL LOW (ref 80.0–100.0)
Platelets: 337 10*3/uL (ref 150–400)
RBC: 2.81 MIL/uL — ABNORMAL LOW (ref 4.22–5.81)
RDW: 21.8 % — ABNORMAL HIGH (ref 11.5–15.5)
WBC: 7.5 10*3/uL (ref 4.0–10.5)
nRBC: 0 % (ref 0.0–0.2)

## 2019-02-05 LAB — BASIC METABOLIC PANEL
Anion gap: 9 (ref 5–15)
BUN: 9 mg/dL (ref 6–20)
CO2: 25 mmol/L (ref 22–32)
Calcium: 8 mg/dL — ABNORMAL LOW (ref 8.9–10.3)
Chloride: 104 mmol/L (ref 98–111)
Creatinine, Ser: 1.96 mg/dL — ABNORMAL HIGH (ref 0.61–1.24)
GFR calc Af Amer: 49 mL/min — ABNORMAL LOW (ref >60)
GFR calc non Af Amer: 42 mL/min — ABNORMAL LOW (ref >60)
Glucose, Bld: 91 mg/dL (ref 70–99)
Potassium: 3.7 mmol/L (ref 3.5–5.1)
Sodium: 138 mmol/L (ref 135–145)

## 2019-02-05 LAB — URINALYSIS, COMPLETE (UACMP) WITH MICROSCOPIC
Bilirubin Urine: NEGATIVE
Glucose, UA: NEGATIVE mg/dL
Hgb urine dipstick: NEGATIVE
Ketones, ur: NEGATIVE mg/dL
Nitrite: NEGATIVE
Protein, ur: NEGATIVE mg/dL
Specific Gravity, Urine: 1.005 (ref 1.005–1.030)
Squamous Epithelial / HPF: NONE SEEN (ref 0–5)
pH: 5 (ref 5.0–8.0)

## 2019-02-05 LAB — PROTEIN / CREATININE RATIO, URINE
Creatinine, Urine: 47 mg/dL
Protein Creatinine Ratio: 0.23 mg/mg{Cre} — ABNORMAL HIGH (ref 0.00–0.15)
Total Protein, Urine: 11 mg/dL

## 2019-02-05 LAB — VANCOMYCIN, RANDOM: Vancomycin Rm: 16

## 2019-02-05 LAB — PHOSPHORUS: Phosphorus: 4.5 mg/dL (ref 2.5–4.6)

## 2019-02-05 LAB — MAGNESIUM: Magnesium: 2.6 mg/dL — ABNORMAL HIGH (ref 1.7–2.4)

## 2019-02-05 NOTE — Progress Notes (Signed)
02/05/2019  Subjective: Patient is 6 Days Post-Op status post extended right colectomy for right colonic mass.  No acute events overnight.  However patient's creatinine is still elevated at 1.96 today and his hemoglobin continues to be low but stable at 6.4.  He has not had any issues with hemodynamic instability and his blood pressure and heart rate have been normal.  He is also making adequate amount of urine and had at least 875 mL of urine output yesterday.  He has tolerated full liquid diet and had a bowel movement 2 days ago.  He is passing gas.  Vital signs: Temp:  [98 F (36.7 C)-98.6 F (37 C)] 98.5 F (36.9 C) (11/26 1256) Pulse Rate:  [80-91] 84 (11/26 1256) Resp:  [14-18] 14 (11/26 1256) BP: (108-118)/(66-73) 116/68 (11/26 1256) SpO2:  [97 %-100 %] 97 % (11/26 1256)   Intake/Output: 11/25 0701 - 11/26 0700 In: 1744.8 [P.O.:240; I.V.:1128.8; IV Piggyback:376] Out: 1017 [Urine:875; Drains:142] Last BM Date: 02/03/19  Physical Exam: Constitutional: No acute distress Abdomen: Soft, nondistended, appropriately tender to palpation.  Midline incision is clean and dry with a wound VAC in place with serosanguineous drainage in the canister.  Patient has bilateral drains which are showing serosanguineous fluid.  Labs:  Recent Labs    02/04/19 0447 02/05/19 0515  WBC 7.6 7.5  HGB 6.2* 6.4*  HCT 20.6* 20.1*  PLT 334 337   Recent Labs    02/04/19 0447 02/05/19 0515  NA 139 138  K 4.0 3.7  CL 105 104  CO2 26 25  GLUCOSE 102* 91  BUN 11 9  CREATININE 1.99* 1.96*  CALCIUM 7.8* 8.0*   No results for input(s): LABPROT, INR in the last 72 hours.  Imaging: No results found.  Assessment/Plan: This is a 38 y.o. male s/p extended right colectomy for colon cancer.  -Discussed again with the patient his pathology that this is a T4N1 colon cancer which puts him at stage IIIb.  Discussed with him the potential management from this including chemotherapy plus minus radiation  of the area since this was a T4 mass.  Discussed with the patient that he would be seen by oncology potentially by radiation oncology as an outpatient.  However at first we need to deal with his low hemoglobin as well as worsening renal function.  Although both today are stable though still not in the normal range.  We will consult the nephrology team for further assistance with this. -Otherwise continue IV antibiotics for the abscess related to the colon mass.  Cultures currently growing E. coli Streptococcus viridans as well as Prevotella. -We will advance diet to soft diet today. -Also discussed with the patient that at this point since his insurance does not cover a wound VAC for outpatient therapy, will continue with wound VAC dressings while in the hospital with plans for wet-to-dry upon discharge from the hospital.  He is comfortable with this plan and he and his family will learn how to do the dressing changes on discharge.   Melvyn Neth, [facility]

## 2019-02-05 NOTE — Consult Note (Signed)
[facility] CONSULT NOTE    Date: 02/05/2019                  Patient Name:  Tristan [name]  MRN: [id number]  DOB: 1980/05/26  Age / Sex: 38 y.o., male         PCP: Patient, No Pcp Per                 Service Requesting Consult: Surgery                 Reason for Consult: Acute kidney injury            History of Present Illness: Patient is a 38 y.o. male with a PMHx of GERD, asthma, recent right colonic mass who was admitted to [facility] on 01/30/2019 for right extended colectomy.  He has had a known right colonic mass which was also associated with a pelvic abscess.  The colonic mass was found to be perforated.   Patient did have significant volume loss in the form of blood loss postoperatively.  At the time of surgery his hemoglobin was 8.8 but did go as low as 5.7.  Patient has received multiple blood transfusions.  His preoperative creatinine was 1.05.  Creatinine is now risen to 1.96.  Patient did have a CT scan of the abdomen and pelvis performed on 01/21/2019 with contrast however as above his creatinine was normal thereafter.  He has also been noted as having an elevated vancomycin level of 43 on 01/31/2019.    Medications: Outpatient medications: Medications Prior to Admission  Medication Sig Dispense Refill Last Dose  . bisacodyl (DULCOLAX) 5 MG EC tablet Take all 4 tablets at 8 am the morning prior to your surgery. 4 tablet 0 01/29/2019 at 0800  . calcium carbonate (TUMS - DOSED IN MG ELEMENTAL CALCIUM) 500 MG chewable tablet Chew 2 tablets by mouth 2 (two) times daily as needed for indigestion or heartburn.   01/29/2019 at 0800  . erythromycin base (E-MYCIN) 500 MG tablet Take 2 tablets at 8am, 2 tablets at 2pm, and 2 tablets at 8pm the day prior to surgery. 6 tablet 0 01/29/2019 at 1400  . gabapentin (NEURONTIN) 300 MG capsule Take 1 capsule (300 mg total) by mouth 3 (three) times daily. 30 capsule 1 01/29/2019 at 0800  . ketorolac (TORADOL) 10 MG  tablet Take 1 tablet (10 mg total) by mouth every 6 (six) hours as needed. 30 tablet 0 01/29/2019 at 0800  . neomycin (MYCIFRADIN) 500 MG tablet Take 2 tablet at 8am, take 2 tablets at 2pm, and take 2 tablets at 8pm the day prior to your surgery 6 tablet 0 01/29/2019 at 0800  . polyethylene glycol powder (MIRALAX) 17 GM/SCOOP powder Mix full container in 64 ounces of Gatorade or other clear liquid. NO RED LIQUIDS 238 g 0 01/29/2019 at 0800  . albuterol (PROVENTIL HFA;VENTOLIN HFA) 108 (90 Base) MCG/ACT inhaler Inhale 2 puffs into the lungs every 4 (four) hours as needed for wheezing. (Patient not taking: Reported on 01/26/2019) 1 Inhaler 0 Not Taking at Unknown time  . HYDROcodone-acetaminophen (NORCO/VICODIN) 5-325 MG tablet Take 1 tablet by mouth every 4 (four) hours as needed. 15 tablet 0 Not Taking at Unknown time    Current medications: Current Facility-Administered Medications  Medication Dose Route Frequency Provider Last Rate Last Dose  . 0.9 %  sodium chloride infusion   Intravenous Continuous Jules Husbands, MD 40 mL/hr at 02/04/19 2037    .  0.9 %  sodium chloride infusion   Intravenous PRN Caroleen Hamman F, MD 10 mL/hr at 02/05/19 0141 250 mL at 02/05/19 0141  . hydrALAZINE (APRESOLINE) injection 10 mg  10 mg Intravenous Q2H PRN Pabon, Diego F, MD      . morphine 4 MG/ML injection 4 mg  4 mg Intravenous Q3H PRN Jules Husbands, MD   4 mg at 02/05/19 0935  . ondansetron (ZOFRAN-ODT) disintegrating tablet 4 mg  4 mg Oral Q6H PRN Pabon, Diego F, MD   4 mg at 02/03/19 1020   Or  . ondansetron (ZOFRAN) injection 4 mg  4 mg Intravenous Q6H PRN Jules Husbands, MD   4 mg at 02/05/19 0951  . oxyCODONE (Oxy IR/ROXICODONE) immediate release tablet 5-10 mg  5-10 mg Oral Q4H PRN Caroleen Hamman F, MD   10 mg at 02/04/19 2016  . pantoprazole (PROTONIX) injection 40 mg  40 mg Intravenous Q12H Pabon, Diego F, MD   40 mg at 02/05/19 0935  . phenol (CHLORASEPTIC) mouth spray 1 spray  1 spray Mouth/Throat PRN  Pabon, Diego F, MD      . piperacillin-tazobactam (ZOSYN) IVPB 3.375 g  3.375 g Intravenous Q8H Pabon, Diego F, MD 12.5 mL/hr at 02/05/19 0938 3.375 g at 02/05/19 0938  . prochlorperazine (COMPAZINE) tablet 10 mg  10 mg Oral Q6H PRN Pabon, Diego F, MD       Or  . prochlorperazine (COMPAZINE) injection 5 mg  5 mg Intravenous Q6H PRN Pabon, Marjory Lies, MD          Allergies: No Known Allergies    Past Medical History: Past Medical History:  Diagnosis Date  . Abdominal pain   . Asthma   . GERD (gastroesophageal reflux disease)   . Nausea   . Poor appetite   . Weight loss      Past Surgical History: Past Surgical History:  Procedure Laterality Date  . COLONOSCOPY N/A 01/30/2019   Procedure: COLONOSCOPY;  Surgeon: Jules Husbands, MD;  Location: [facility] ORS;  Service: General;  Laterality: N/A;  . COLONOSCOPY WITH PROPOFOL N/A 08/16/2016   Procedure: COLONOSCOPY WITH PROPOFOL;  Surgeon: [name], MD;  Location: [facility] ENDOSCOPY;  Service: Endoscopy;  Laterality: N/A;  . COLONOSCOPY WITH PROPOFOL N/A 01/30/2019   Procedure: COLONOSCOPY WITH PROPOFOL;  Surgeon: [name], MD;  Location: [facility] ENDOSCOPY;  Service: Gastroenterology;  Laterality: N/A;  TRAVEL CASE TO O.R. - PROCEDURE TO START AT 7:30 AM IN O.R.  . LAPAROSCOPIC RIGHT COLECTOMY N/A 01/30/2019   Procedure: LAPAROSCOPIC HAND ASSISTED RIGHT COLECTOMY CONVERTED TO OPEN PROCEDURE;  Surgeon: Jules Husbands, MD;  Location: [facility] ORS;  Service: General;  Laterality: N/A;  . NO PAST SURGERIES       Family History: Family History  Problem Relation Age of Onset  . Diabetes Mother   . Healthy Father   . Colon polyps Father   . Colon cancer Other   . Ulcerative colitis Paternal Aunt      Social History: Social History   Socioeconomic History  . Marital status: Single    Spouse name: Not on file  . Number of children: Not on file  . Years of education: Not on file  . Highest education level: Not on file  Occupational History   . Not on file  Social Needs  . Financial resource strain: Not on file  . Food insecurity    Worry: Not on file    Inability: Not on file  . Transportation  needs    Medical: Not on file    Non-medical: Not on file  Tobacco Use  . Smoking status: Current Every Day Smoker    Packs/day: 0.50    Types: Cigarettes  . Smokeless tobacco: Never Used  Substance and Sexual Activity  . Alcohol use: Not Currently  . Drug use: Yes    Types: Marijuana    Comment: last week  . Sexual activity: Yes    Birth control/protection: Condom  Lifestyle  . Physical activity    Days per week: Not on file    Minutes per session: Not on file  . Stress: Not on file  Relationships  . Social Herbalist on phone: Not on file    Gets together: Not on file    Attends religious service: Not on file    Active member of club or organization: Not on file    Attends meetings of clubs or organizations: Not on file    Relationship status: Not on file  . Intimate partner violence    Fear of current or ex partner: Not on file    Emotionally abused: Not on file    Physically abused: Not on file    Forced sexual activity: Not on file  Other Topics Concern  . Not on file  Social History Narrative  . Not on file     Review of Systems: Review of Systems  Constitutional: Negative for chills, fever and malaise/fatigue.  HENT: Negative for congestion, hearing loss and tinnitus.   Eyes: Negative for blurred vision and double vision.  Respiratory: Negative for cough, hemoptysis and shortness of breath.   Cardiovascular: Negative for chest pain, palpitations and leg swelling.  Gastrointestinal: Positive for heartburn and nausea. Negative for abdominal pain, diarrhea and vomiting.  Genitourinary: Positive for dysuria. Negative for frequency and urgency.  Musculoskeletal: Negative for joint pain and myalgias.  Skin: Positive for itching. Negative for rash.  Neurological: Negative for dizziness and focal  weakness.  Endo/Heme/Allergies: Negative for polydipsia. Does not bruise/bleed easily.  Psychiatric/Behavioral: Negative for depression. The patient is not nervous/anxious.      Vital Signs: Blood pressure 113/66, pulse 90, temperature 98.4 F (36.9 C), temperature source Oral, resp. rate 18, height 6\' 1"  (1.854 m), weight 77.3 kg, SpO2 100 %.  Weight trends: Filed Weights   01/30/19 0629 02/01/19 1346  Weight: 79.4 kg 77.3 kg    Physical Exam: General: NAD, resting in bed  Head: Normocephalic, atraumatic.  Eyes: Anicteric, EOMI  Nose: Mucous membranes moist, not inflammed, nonerythematous.  Throat: Oropharynx nonerythematous, no exudate appreciated.   Neck: Supple, trachea midline.  Lungs:  Normal respiratory effort. Clear to auscultation BL without crackles or wheezes.  Heart: RRR. S1 and S2 normal without gallop, murmur, or rubs.  Abdomen:  JP drain and wound vac in place.  Soft, NT, ND.   Extremities: No pretibial edema.  Neurologic: A&O X3, Motor strength is 5/5 in the all 4 extremities  Skin: No visible rashes, scars.    Lab results: Basic Metabolic Panel: Recent Labs  Lab 01/31/19 0709 01/31/19 0852 02/01/19 0519  02/03/19 0444 02/04/19 0447 02/05/19 0515  NA 134* 133* 137   < > 138 139 138  K 5.3* 4.8 3.9   < > 4.7 4.0 3.7  CL 97* 97* 103   < > 103 105 104  CO2 25 25 23    < > 26 26 25   GLUCOSE 137* 132* 92   < > 98 102* 91  BUN 24* 24* 22*   < > 11 11 9   CREATININE 1.12 1.21 0.97   < > 1.71* 1.99* 1.96*  CALCIUM 7.6* 7.3* 7.4*   < > 8.1* 7.8* 8.0*  MG 2.5*  --  2.7*  --   --   --  2.6*  PHOS 6.3* 6.0* 3.4  --   --   --  4.5   < > = values in this interval not displayed.    Liver Function Tests: Recent Labs  Lab 02/01/19 0519  AST 23  ALT 23  ALKPHOS 37*  BILITOT 0.4  PROT 5.6*  ALBUMIN 2.0*   No results for input(s): LIPASE, AMYLASE in the last 168 hours. No results for input(s): AMMONIA in the last 168 hours.  CBC: Recent Labs  Lab  02/01/19 0519 02/02/19 0425 02/03/19 0444 02/04/19 0447 02/05/19 0515  WBC 9.1 6.4 8.3 7.6 7.5  HGB 5.7* 6.6* 7.0* 6.2* 6.4*  HCT 19.1* 20.1* 22.5* 20.6* 20.1*  MCV 70.7* 69.8* 71.7* 74.6* 71.5*  PLT 351 311 347 334 337    Cardiac Enzymes: No results for input(s): CKTOTAL, CKMB, CKMBINDEX, TROPONINI in the last 168 hours.  BNP: Invalid input(s): POCBNP  CBG: No results for input(s): GLUCAP in the last 168 hours.  Microbiology: Results for orders placed or performed during the hospital encounter of 01/30/19  Gram stain     Status: None   Collection Time: 01/30/19  8:49 AM   Specimen: Abscess  Result Value Ref Range Status   Specimen Description ABSCESS  Final   Special Requests NONE  Final   Gram Stain   Final    MODERATE WBC SEEN MANY GRAM POSITIVE COCCI FEW GRAM NEGATIVE RODS FEW GRAM POSITIVE RODS RESULT CALLED TO, READ BACK BY AND VERIFIED WITH: DR. Dahlia Byes AT 1030 01/30/2019 SDR Performed at [facility], [address] [place], [place] [address]    Report Status 01/30/2019 FINAL  Final  Aerobic/Anaerobic Culture (surgical/deep wound)     Status: None   Collection Time: 01/30/19  8:50 AM   Specimen: Abscess; Wound  Result Value Ref Range Status   Specimen Description ABSCESS PELVIS 2  Final   Special Requests NONE  Final   Gram Stain   Final    NO WBC SEEN RARE GRAM POSITIVE COCCI RARE GRAM POSITIVE RODS    Culture   Final    FEW ESCHERICHIA COLI MODERATE VIRIDANS STREPTOCOCCUS MODERATE PREVOTELLA BUCCAE BETA LACTAMASE POSITIVE Performed at [facility], [place] [address] [place], [place] [address]    Report Status 02/04/2019 FINAL  Final   Organism ID, Bacteria ESCHERICHIA COLI  Final      Susceptibility   Escherichia coli - MIC*    AMPICILLIN >=32 RESISTANT Resistant     CEFAZOLIN <=4 SENSITIVE Sensitive     CEFEPIME <=1 SENSITIVE Sensitive     CEFTAZIDIME <=1 SENSITIVE Sensitive     CEFTRIAXONE <=1 SENSITIVE Sensitive      CIPROFLOXACIN <=0.25 SENSITIVE Sensitive     GENTAMICIN >=16 RESISTANT Resistant     IMIPENEM <=0.25 SENSITIVE Sensitive     TRIMETH/SULFA >=320 RESISTANT Resistant     AMPICILLIN/SULBACTAM 16 INTERMEDIATE Intermediate     PIP/TAZO <=4 SENSITIVE Sensitive     Extended ESBL NEGATIVE Sensitive     * FEW ESCHERICHIA COLI    Coagulation Studies: No results for input(s): LABPROT, INR in the last 72 hours.  Urinalysis: No results for input(s): COLORURINE, LABSPEC, [place], [place], [place], [place], [place], [place],  UROBILINOGEN, NITRITE, LEUKOCYTESUR in the last 72 hours.  Invalid input(s): APPERANCEUR    Imaging:  No results found.   Assessment & Plan: Pt is a 38 y.o. male with a PMHx of GERD, asthma, recent right colonic mass who was admitted to [facility] on 01/30/2019 for right extended colectomy.   1.  Acute kidney injury baseline Cr 0.89, Cr currently 3.7. 2.  Acute blood loss anemia 3.  Newly diagnosed colon cancer.   Plan:  Patient presents with a very interesting case.  He has newly diagnosed colon cancer and is status post right extended colectomy.  His baseline creatinine appears to be 0.89.  Suspect that his acute kidney injury now is most likely related to volume loss from blood loss.  However we do need to exclude other causes as well.  Therefore we will check renal ultrasound, urinalysis, urine protein to creatinine ratio.  Patient did have an elevated vancomycin level of 43 on 02/01/2019.  This could potentially be playing some role as well.  In addition he had a CT scan with contrast performed on 01/21/2019 however it is less likely that this is now playing a role.  Continue IV fluid hydration as being performed at the moment.  Defer decisions regarding blood transfusion to surgery but hemoglobin still low at 6.4.  Thanks for consultation.  Further plan as patient progresses.

## 2019-02-06 LAB — C3 COMPLEMENT: C3 Complement: 132 mg/dL (ref 82–167)

## 2019-02-06 LAB — CBC WITH DIFFERENTIAL/PLATELET
Abs Immature Granulocytes: 0.29 10*3/uL — ABNORMAL HIGH (ref 0.00–0.07)
Basophils Absolute: 0 10*3/uL (ref 0.0–0.1)
Basophils Relative: 0 %
Eosinophils Absolute: 0.3 10*3/uL (ref 0.0–0.5)
Eosinophils Relative: 3 %
HCT: 22.9 % — ABNORMAL LOW (ref 39.0–52.0)
Hemoglobin: 7.2 g/dL — ABNORMAL LOW (ref 13.0–17.0)
Immature Granulocytes: 3 %
Lymphocytes Relative: 11 %
Lymphs Abs: 1 10*3/uL (ref 0.7–4.0)
MCH: 23 pg — ABNORMAL LOW (ref 26.0–34.0)
MCHC: 31.4 g/dL (ref 30.0–36.0)
MCV: 73.2 fL — ABNORMAL LOW (ref 80.0–100.0)
Monocytes Absolute: 0.8 10*3/uL (ref 0.1–1.0)
Monocytes Relative: 8 %
Neutro Abs: 7 10*3/uL (ref 1.7–7.7)
Neutrophils Relative %: 75 %
Platelets: 354 10*3/uL (ref 150–400)
RBC: 3.13 MIL/uL — ABNORMAL LOW (ref 4.22–5.81)
RDW: 22.7 % — ABNORMAL HIGH (ref 11.5–15.5)
WBC: 9.4 10*3/uL (ref 4.0–10.5)
nRBC: 0 % (ref 0.0–0.2)

## 2019-02-06 LAB — RENAL FUNCTION PANEL
Albumin: 2.2 g/dL — ABNORMAL LOW (ref 3.5–5.0)
Anion gap: 10 (ref 5–15)
BUN: 7 mg/dL (ref 6–20)
CO2: 26 mmol/L (ref 22–32)
Calcium: 8.3 mg/dL — ABNORMAL LOW (ref 8.9–10.3)
Chloride: 101 mmol/L (ref 98–111)
Creatinine, Ser: 2.09 mg/dL — ABNORMAL HIGH (ref 0.61–1.24)
GFR calc Af Amer: 46 mL/min — ABNORMAL LOW (ref >60)
GFR calc non Af Amer: 39 mL/min — ABNORMAL LOW (ref >60)
Glucose, Bld: 124 mg/dL — ABNORMAL HIGH (ref 70–99)
Phosphorus: 4.4 mg/dL (ref 2.5–4.6)
Potassium: 3.8 mmol/L (ref 3.5–5.1)
Sodium: 137 mmol/L (ref 135–145)

## 2019-02-06 LAB — CK: Total CK: 34 U/L — ABNORMAL LOW (ref 49–397)

## 2019-02-06 LAB — C4 COMPLEMENT: Complement C4, Body Fluid: 20 mg/dL (ref 12–38)

## 2019-02-06 NOTE — Progress Notes (Addendum)
Subjective:  CC: Tristan [name] is a 38 y.o. male  Hospital stay day 7, 7 Days Post-Op status post extended right colectomy for right colonic mass.   HPI: status post extended right colectomy for right colonic mass. No acute issues overnight.  ROS:  General: Denies weight loss, weight gain, fatigue, fevers, chills, and night sweats. Heart: Denies chest pain, palpitations, racing heart, irregular heartbeat, leg pain or swelling, and decreased activity tolerance. Respiratory: Denies breathing difficulty, shortness of breath, wheezing, cough, and sputum. GI: Denies change in appetite, heartburn, nausea, vomiting, constipation, diarrhea, and blood in stool. GU: Denies difficulty urinating, pain with urinating, urgency, frequency, blood in urine.   Objective:   Temp:  [98.2 F (36.8 C)-99 F (37.2 C)] 98.2 F (36.8 C) (11/27 1309) Pulse Rate:  [75-85] 85 (11/27 1309) Resp:  [16-20] 16 (11/27 1309) BP: (107-116)/(56-65) 110/65 (11/27 1309) SpO2:  [96 %-99 %] 99 % (11/27 1309)     Height: 6\' 1"  (185.4 cm) Weight: 77.3 kg(bed scale) BMI (Calculated): 22.49   Intake/Output this shift:   Intake/Output Summary (Last 24 hours) at 02/06/2019 1729 Last data filed at 02/06/2019 1559 Gross per 24 hour  Intake 240 ml  Output 1900 ml  Net -1660 ml    Constitutional :  alert, cooperative, appears stated age and no distress  Respiratory:  clear to auscultation bilaterally  Cardiovascular:  regular rate and rhythm  Gastrointestinal: soft, no guarding, TTP along incision, as expected.  wound vacn intact. JP with old hematoma fluid 194ml out.  Skin: Cool and moist.   Psychiatric: Normal affect, non-agitated, not confused       LABS:  CMP Latest Ref Rng & Units 02/06/2019 02/05/2019 02/04/2019  Glucose 70 - 99 mg/dL 124(H) 91 102(H)  BUN 6 - 20 mg/dL 7 9 11   Creatinine 0.61 - 1.24 mg/dL 2.09(H) 1.96(H) 1.99(H)  Sodium 135 - 145 mmol/L 137 138 139  Potassium 3.5 - 5.1 mmol/L 3.8 3.7 4.0   Chloride 98 - 111 mmol/L 101 104 105  CO2 22 - 32 mmol/L 26 25 26   Calcium 8.9 - 10.3 mg/dL 8.3(L) 8.0(L) 7.8(L)  Total Protein 6.5 - 8.1 g/dL - - -  Total Bilirubin 0.3 - 1.2 mg/dL - - -  Alkaline Phos 38 - 126 U/L - - -  AST 15 - 41 U/L - - -  ALT 0 - 44 U/L - - -   CBC Latest Ref Rng & Units 02/06/2019 02/05/2019 02/04/2019  WBC 4.0 - 10.5 K/uL 9.4 7.5 7.6  Hemoglobin 13.0 - 17.0 g/dL 7.2(L) 6.4(L) 6.2(L)  Hematocrit 39.0 - 52.0 % 22.9(L) 20.1(L) 20.6(L)  Platelets 150 - 400 K/uL 354 337 334    RADS: n/a Assessment:   status post extended right colectomy for right colonic mass. Hgb low, but elevated compared to previous draw.  Cr stil increasing.  nephro on board.  Appreciate recs.  Continue to monitor levels for today.

## 2019-02-06 NOTE — TOC Progression Note (Signed)
Transition of Care [facility]) - Progression Note    Patient Details  Name: Tristan [name] MRN: [id number] Date of Birth: 11/11/1980  Transition of [facility] CM/SW Contact  Beverly Sessions, RN Phone Number: 02/06/2019, 8:05 PM  Clinical Narrative:    Rounding MD notified that plan is for patient to discharge with wet to dry dressings when medically ready  At this time patient will not have home health services at discharge  I have faxed in charity information for wound vac to [facility] with [employer], should patient need to transition to vac outpatient.   Pat with [facility] has the orders for home health should they be able to staff the case it will be $130 per visit.  Patient and his mother are aware.  [facility] does not open back up from the holiday till Monday.  See my previous note for their contact information    Expected Discharge Plan: [place] Barriers to Discharge: Continued Medical Work up  Expected Discharge Plan and Services Expected Discharge Plan: [place]   Discharge Planning Services: CM Consult Post Acute Care Choice: [place] arrangements for the past 2 months: Single Family Home                 DME Arranged: Vac DME Agency: [employer] Date DME Agency Contacted: 02/03/19 Time DME Agency Contacted: [phone] Representative spoke with at DME Agency: [place]: RN [place] Agency: Other - See comment([facility]) Date [place]: 02/03/19   Representative spoke with at [place]: [place] ([place]) Interventions    Readmission Risk Interventions No flowsheet data found.

## 2019-02-06 NOTE — Progress Notes (Signed)
[facility]  ROUNDING NOTE   Subjective:  Patient appears to be having good urine output. Urine output was 1050 cc over the preceding 24 hours. Renal ultrasound was negative for hydronephrosis. However creatinine still a bit high at 2.09. Hemoglobin improved a bit to 7.2.   Objective:  Vital signs in last 24 hours:  Temp:  [98.2 F (36.8 C)-99 F (37.2 C)] 98.2 F (36.8 C) (11/27 1309) Pulse Rate:  [75-85] 85 (11/27 1309) Resp:  [16-20] 16 (11/27 1309) BP: (107-116)/(56-65) 110/65 (11/27 1309) SpO2:  [96 %-99 %] 99 % (11/27 1309)  Weight change:  Filed Weights   01/30/19 0629 02/01/19 1346  Weight: 79.4 kg 77.3 kg    Intake/Output: I/O last 3 completed shifts: In: 451 [P.O.:240; I.V.:165.3; IV Piggyback:45.8] Out: [id number]; Drains:142]   Intake/Output this shift:  Total I/O In: 240 [P.O.:240] Out: 550 [Urine:550]  Physical Exam: General: No acute distress  Head: Normocephalic, atraumatic. Moist oral mucosal membranes  Eyes: Anicteric  Neck: Supple, trachea midline  Lungs:  Clear to auscultation, normal effort  Heart: S1S2 no rubs  Abdomen:  Soft, JP and wound VAC in place  Extremities: Trace peripheral edema.  Neurologic: Awake, alert, following commands  Skin: No lesions       Basic Metabolic Panel: Recent Labs  Lab 01/31/19 0709 01/31/19 0852 02/01/19 0519 02/02/19 0425 02/03/19 0444 02/04/19 0447 02/05/19 0515 02/06/19 0852  NA 134* 133* 137 137 138 139 138 137  K 5.3* 4.8 3.9 3.8 4.7 4.0 3.7 3.8  CL 97* 97* 103 105 103 105 104 101  CO2 25 25 23 23 26 26 25 26   GLUCOSE 137* 132* 92 92 98 102* 91 124*  BUN 24* 24* 22* 12 11 11 9 7   CREATININE 1.12 1.21 0.97 0.89 1.71* 1.99* 1.96* 2.09*  CALCIUM 7.6* 7.3* 7.4* 7.6* 8.1* 7.8* 8.0* 8.3*  MG 2.5*  --  2.7*  --   --   --  2.6*  --   PHOS 6.3* 6.0* 3.4  --   --   --  4.5 4.4    Liver Function Tests: Recent Labs  Lab 02/01/19 0519 02/06/19 0852  AST 23  --   ALT 23  --    ALKPHOS 37*  --   BILITOT 0.4  --   PROT 5.6*  --   ALBUMIN 2.0* 2.2*   No results for input(s): LIPASE, AMYLASE in the last 168 hours. No results for input(s): AMMONIA in the last 168 hours.  CBC: Recent Labs  Lab 02/02/19 0425 02/03/19 0444 02/04/19 0447 02/05/19 0515 02/06/19 0852  WBC 6.4 8.3 7.6 7.5 9.4  NEUTROABS  --   --   --   --  7.0  HGB 6.6* 7.0* 6.2* 6.4* 7.2*  HCT 20.1* 22.5* 20.6* 20.1* 22.9*  MCV 69.8* 71.7* 74.6* 71.5* 73.2*  PLT 311 347 334 337 354    Cardiac Enzymes: No results for input(s): CKTOTAL, CKMB, CKMBINDEX, TROPONINI in the last 168 hours.  BNP: Invalid input(s): POCBNP  CBG: No results for input(s): GLUCAP in the last 168 hours.  Microbiology: Results for orders placed or performed during the hospital encounter of 01/30/19  Gram stain     Status: None   Collection Time: 01/30/19  8:49 AM   Specimen: Abscess  Result Value Ref Range Status   Specimen Description ABSCESS  Final   Special Requests NONE  Final   Gram Stain   Final    MODERATE WBC SEEN MANY  GRAM POSITIVE COCCI FEW GRAM NEGATIVE RODS FEW GRAM POSITIVE RODS RESULT CALLED TO, READ BACK BY AND VERIFIED WITH: DR. Dahlia Byes AT 1030 01/30/2019 SDR Performed at [facility], [address] [place], [place] [address]    Report Status 01/30/2019 FINAL  Final  Aerobic/Anaerobic Culture (surgical/deep wound)     Status: None   Collection Time: 01/30/19  8:50 AM   Specimen: Abscess; Wound  Result Value Ref Range Status   Specimen Description ABSCESS PELVIS 2  Final   Special Requests NONE  Final   Gram Stain   Final    NO WBC SEEN RARE GRAM POSITIVE COCCI RARE GRAM POSITIVE RODS    Culture   Final    FEW ESCHERICHIA COLI MODERATE VIRIDANS STREPTOCOCCUS MODERATE PREVOTELLA BUCCAE BETA LACTAMASE POSITIVE Performed at [facility], [place] [address], [place], [place] [address]    Report Status 02/04/2019 FINAL  Final   Organism ID, Bacteria ESCHERICHIA COLI   Final      Susceptibility   Escherichia coli - MIC*    AMPICILLIN >=32 RESISTANT Resistant     CEFAZOLIN <=4 SENSITIVE Sensitive     CEFEPIME <=1 SENSITIVE Sensitive     CEFTAZIDIME <=1 SENSITIVE Sensitive     CEFTRIAXONE <=1 SENSITIVE Sensitive     CIPROFLOXACIN <=0.25 SENSITIVE Sensitive     GENTAMICIN >=16 RESISTANT Resistant     IMIPENEM <=0.25 SENSITIVE Sensitive     TRIMETH/SULFA >=320 RESISTANT Resistant     AMPICILLIN/SULBACTAM 16 INTERMEDIATE Intermediate     PIP/TAZO <=4 SENSITIVE Sensitive     Extended ESBL NEGATIVE Sensitive     * FEW ESCHERICHIA COLI    Coagulation Studies: No results for input(s): LABPROT, INR in the last 72 hours.  Urinalysis: Recent Labs    02/05/19 1602  COLORURINE STRAW*  LABSPEC 1.005  PHURINE 5.0  GLUCOSEU NEGATIVE  HGBUR NEGATIVE  BILIRUBINUR NEGATIVE  KETONESUR NEGATIVE  PROTEINUR NEGATIVE  NITRITE NEGATIVE  LEUKOCYTESUR TRACE*      Imaging: US Renal  Result Date: 02/05/2019 CLINICAL DATA:  Acute kidney failure, unspecified. EXAM: RENAL / URINARY TRACT ULTRASOUND COMPLETE COMPARISON:  CT abdomen/pelvis 01/21/2019 FINDINGS: Right Kidney: Renal measurements: 14.2 x 5.6 x 6.6 cm = volume: 279.9 mL . Echogenicity within normal limits. No mass or hydronephrosis visualized. Left Kidney: Renal measurements: 13.4 x 6.0 x 5.4 cm = volume: 228.7 mL. Echogenicity within normal limits. No mass or hydronephrosis visualized. Bladder: The bladder is not visualized. Other: Probable left pleural effusion. IMPRESSION: No hydronephrosis.  Renal cortical echogenicity is unremarkable. The bladder is not visualized. Probable left pleural effusion Electronically Signed   By: Kellie Simmering DO   On: 02/05/2019 13:37     Medications:   . sodium chloride 40 mL/hr at 02/06/19 [id number]  . sodium chloride 250 mL (02/06/19 0201)  . piperacillin-tazobactam (ZOSYN)  IV 3.375 g (02/06/19 0919)   . pantoprazole (PROTONIX) IV  40 mg Intravenous Q12H   sodium  chloride, hydrALAZINE, morphine injection, ondansetron **OR** ondansetron (ZOFRAN) IV, oxyCODONE, phenol, prochlorperazine **OR** prochlorperazine  Assessment/ Plan:  38 y.o. male with a PMHx of GERD, asthma, recent right colonic mass who was admitted to [facility] on 01/30/2019 for right extended colectomy.   1.  Acute kidney injury baseline Cr 0.89, Cr currently 2.09/proteinuria. 2.  Acute blood loss anemia, hemoglobin 7.2 3.  Newly diagnosed colon cancer.   Plan:  The patient's urinalysis was fairly unremarkable.  Renal ultrasound also negative for hydronephrosis.  Suspect acute kidney injury is  likely multifactorial.  He was exposed to contrast greater than 1 week ago.  He is also had acute blood loss anemia which could potentially have led to acute kidney injury in the form of decreased circulating volume and thereby leading to decreased renal perfusion.  In addition he has had an isolated elevated vancomycin level as well.  We also plan to check a CK to make sure there is no underlying rhabdomyolysis.  For now maintain the patient on 0.9 normal saline at 40 cc/h.  Hopefully his kidney function will begin improving over the next several days.  Otherwise for now we recommend ongoing supportive care and avoiding any further nephrotoxins.   LOS: 7 Cassidey Barrales 11/27/20203:44 PM

## 2019-02-06 NOTE — Consult Note (Signed)
[facility] Nurse wound follow up Wound type:midline surgical wound  NPWT dressing change Measurement: 12.6 cm x 3.8 cm x 1.8 cm markedly improved since last assessment Wound [id number] pink, granulating Drainage (amount, consistency, odor) minimal serosanguinous  No odor Periwound: drain site Dressing procedure/placement/frequency: Cleanse wound with NS.  Apply 1 piece black foam.  Cover with drape.   Change M/W/F May discharge home with NS gauze dressing.  Bedside RN to teach patient and mother how to perform care.  Please send home with NS, 4x4 gauze, ABD pads and tape.  Change daily.  Will not follow at this time.  Please re-consult if needed.  Domenic Moras MSN, RN, FNP-BC CWON Wound, Ostomy, Continence Nurse Pager [phone]

## 2019-02-07 LAB — CBC WITH DIFFERENTIAL/PLATELET
Abs Immature Granulocytes: 0.29 10*3/uL — ABNORMAL HIGH (ref 0.00–0.07)
Basophils Absolute: 0 10*3/uL (ref 0.0–0.1)
Basophils Relative: 0 %
Eosinophils Absolute: 0.3 10*3/uL (ref 0.0–0.5)
Eosinophils Relative: 4 %
HCT: 19.5 % — ABNORMAL LOW (ref 39.0–52.0)
Hemoglobin: 6.1 g/dL — ABNORMAL LOW (ref 13.0–17.0)
Immature Granulocytes: 3 %
Lymphocytes Relative: 13 %
Lymphs Abs: 1.1 10*3/uL (ref 0.7–4.0)
MCH: 22.7 pg — ABNORMAL LOW (ref 26.0–34.0)
MCHC: 31.3 g/dL (ref 30.0–36.0)
MCV: 72.5 fL — ABNORMAL LOW (ref 80.0–100.0)
Monocytes Absolute: 1 10*3/uL (ref 0.1–1.0)
Monocytes Relative: 11 %
Neutro Abs: 5.8 10*3/uL (ref 1.7–7.7)
Neutrophils Relative %: 69 %
Platelets: 281 10*3/uL (ref 150–400)
RBC: 2.69 MIL/uL — ABNORMAL LOW (ref 4.22–5.81)
RDW: 22.3 % — ABNORMAL HIGH (ref 11.5–15.5)
WBC: 8.5 10*3/uL (ref 4.0–10.5)
nRBC: 0 % (ref 0.0–0.2)

## 2019-02-07 LAB — BASIC METABOLIC PANEL
Anion gap: 7 (ref 5–15)
BUN: 7 mg/dL (ref 6–20)
CO2: 28 mmol/L (ref 22–32)
Calcium: 8 mg/dL — ABNORMAL LOW (ref 8.9–10.3)
Chloride: 101 mmol/L (ref 98–111)
Creatinine, Ser: 2 mg/dL — ABNORMAL HIGH (ref 0.61–1.24)
GFR calc Af Amer: 48 mL/min — ABNORMAL LOW (ref >60)
GFR calc non Af Amer: 41 mL/min — ABNORMAL LOW (ref >60)
Glucose, Bld: 98 mg/dL (ref 70–99)
Potassium: 3.6 mmol/L (ref 3.5–5.1)
Sodium: 136 mmol/L (ref 135–145)

## 2019-02-07 LAB — MAGNESIUM: Magnesium: 2 mg/dL (ref 1.7–2.4)

## 2019-02-07 LAB — PREPARE RBC (CROSSMATCH)

## 2019-02-07 LAB — PHOSPHORUS: Phosphorus: 4.3 mg/dL (ref 2.5–4.6)

## 2019-02-07 LAB — HEMOGLOBIN AND HEMATOCRIT, BLOOD
HCT: 24.9 % — ABNORMAL LOW (ref 39.0–52.0)
Hemoglobin: 8.2 g/dL — ABNORMAL LOW (ref 13.0–17.0)

## 2019-02-07 MED ORDER — SODIUM CHLORIDE 0.9% IV SOLUTION
Freq: Once | INTRAVENOUS | Status: AC
  Administered 2019-02-07: 12:00:00 via INTRAVENOUS

## 2019-02-07 NOTE — Progress Notes (Signed)
Subjective:  CC: Tristan [name] is a 39 y.o. male  Hospital stay day 8, 8 Days Post-Op status post extended right colectomy for right colonic mass.   HPI: status post extended right colectomy for right colonic mass. No acute issues overnight.  ROS:  General: Denies weight loss, weight gain, fatigue, fevers, chills, and night sweats. Heart: Denies chest pain, palpitations, racing heart, irregular heartbeat, leg pain or swelling, and decreased activity tolerance. Respiratory: Denies breathing difficulty, shortness of breath, wheezing, cough, and sputum. GI: Denies change in appetite, heartburn, nausea, vomiting, constipation, diarrhea, and blood in stool. GU: Denies difficulty urinating, pain with urinating, urgency, frequency, blood in urine.   Objective:   Temp:  [98.2 F (36.8 C)-99.3 F (37.4 C)] 98.6 F (37 C) (11/28 0501) Pulse Rate:  [84-85] 84 (11/28 0501) Resp:  [14-16] 14 (11/28 0501) BP: (110-122)/(65-74) 113/66 (11/28 0501) SpO2:  [98 %-100 %] 98 % (11/28 0501)     Height: 6\' 1"  (185.4 cm) Weight: 77.3 kg(bed scale) BMI (Calculated): 22.49   Intake/Output this shift:   Intake/Output Summary (Last 24 hours) at 02/07/2019 1150 Last data filed at 02/07/2019 0936 Gross per 24 hour  Intake 3138.16 ml  Output 690 ml  Net 2448.16 ml    Constitutional :  alert, cooperative, appears stated age and no distress  Respiratory:  clear to auscultation bilaterally  Cardiovascular:  regular rate and rhythm  Gastrointestinal: soft, no guarding, TTP along incision, as expected.  wound vacn intact.  Wound VAC intact with serosanguineous output. JP with old hematoma fluid. Only 48ml out  Skin: Cool and moist.   Psychiatric: Normal affect, non-agitated, not confused       LABS:  CMP Latest Ref Rng & Units 02/07/2019 02/06/2019 02/05/2019  Glucose 70 - 99 mg/dL 98 124(H) 91  BUN 6 - 20 mg/dL 7 7 9   Creatinine 0.61 - 1.24 mg/dL 2.00(H) 2.09(H) 1.96(H)  Sodium 135 - 145 mmol/L 136  137 138  Potassium 3.5 - 5.1 mmol/L 3.6 3.8 3.7  Chloride 98 - 111 mmol/L 101 101 104  CO2 22 - 32 mmol/L 28 26 25   Calcium 8.9 - 10.3 mg/dL 8.0(L) 8.3(L) 8.0(L)  Total Protein 6.5 - 8.1 g/dL - - -  Total Bilirubin 0.3 - 1.2 mg/dL - - -  Alkaline Phos 38 - 126 U/L - - -  AST 15 - 41 U/L - - -  ALT 0 - 44 U/L - - -   CBC Latest Ref Rng & Units 02/07/2019 02/06/2019 02/05/2019  WBC 4.0 - 10.5 K/uL 8.5 9.4 7.5  Hemoglobin 13.0 - 17.0 g/dL 6.1(L) 7.2(L) 6.4(L)  Hematocrit 39.0 - 52.0 % 19.5(L) 22.9(L) 20.1(L)  Platelets 150 - 400 K/uL 281 354 337    RADS: n/a Assessment:   status post extended right colectomy for right colonic mass.  No obvious clinical evidence of continued bleeding but hemoglobin dropped again today.  We will transfuse due to the absolute level being at 6.1.  Discussed case with nephrologist regarding his consistently low hemoglobin level along with abnormal creatinine level.  Still no obvious reason for either levels, patient remains clinically doing very well.  We will transfuse today and monitor levels along with the creatinine and if it remains stable can consider discharge and close follow-up as an outpatient.  Patient is in agreement.

## 2019-02-07 NOTE — Progress Notes (Signed)
[facility]  ROUNDING NOTE   Subjective:  Patient seen at bedside. Creatinine currently 2.0. However hemoglobin continues to drift down and was down to 6.1 today. Patient to receive another blood transfusion today. Case discussed with surgery.   Objective:  Vital signs in last 24 hours:  Temp:  [98.2 F (36.8 C)-99.3 F (37.4 C)] 98.5 F (36.9 C) (11/28 1439) Pulse Rate:  [83-86] 83 (11/28 1439) Resp:  [14-18] 16 (11/28 1439) BP: (107-122)/(66-74) 107/68 (11/28 1439) SpO2:  [95 %-100 %] 99 % (11/28 1439)  Weight change:  Filed Weights   01/30/19 0629 02/01/19 1346  Weight: 79.4 kg 77.3 kg    Intake/Output: I/O last 3 completed shifts: In: 2898.2 [P.O.:240; I.V.:2310.2; IV Piggyback:348] Out: 1890 [id number]; Drains:40]   Intake/Output this shift:  Total I/O In: [id number] [P.O.:240; Blood:430] Out: 300 [Urine:300]  Physical Exam: General: No acute distress  Head: Normocephalic, atraumatic. Moist oral mucosal membranes  Eyes: Anicteric  Neck: Supple, trachea midline  Lungs:  Clear to auscultation, normal effort  Heart: S1S2 no rubs  Abdomen:  Soft, JP and wound VAC in place  Extremities: Trace peripheral edema.  Neurologic: Awake, alert, following commands  Skin: No lesions       Basic Metabolic Panel: Recent Labs  Lab 02/01/19 0519  02/03/19 0444 02/04/19 0447 02/05/19 0515 02/06/19 0852 02/07/19 0454  NA 137   < > 138 139 138 137 136  K 3.9   < > 4.7 4.0 3.7 3.8 3.6  CL 103   < > 103 105 104 101 101  CO2 23   < > 26 26 25 26 28   GLUCOSE 92   < > 98 102* 91 124* 98  BUN 22*   < > 11 11 9 7 7   CREATININE 0.97   < > 1.71* 1.99* 1.96* 2.09* 2.00*  CALCIUM 7.4*   < > 8.1* 7.8* 8.0* 8.3* 8.0*  MG 2.7*  --   --   --  2.6*  --  2.0  PHOS 3.4  --   --   --  4.5 4.4 4.3   < > = values in this interval not displayed.    Liver Function Tests: Recent Labs  Lab 02/01/19 0519 02/06/19 0852  AST 23  --   ALT 23  --   ALKPHOS 37*  --   BILITOT  0.4  --   PROT 5.6*  --   ALBUMIN 2.0* 2.2*   No results for input(s): LIPASE, AMYLASE in the last 168 hours. No results for input(s): AMMONIA in the last 168 hours.  CBC: Recent Labs  Lab 02/03/19 0444 02/04/19 0447 02/05/19 0515 02/06/19 0852 02/07/19 0454  WBC 8.3 7.6 7.5 9.4 8.5  NEUTROABS  --   --   --  7.0 5.8  HGB 7.0* 6.2* 6.4* 7.2* 6.1*  HCT 22.5* 20.6* 20.1* 22.9* 19.5*  MCV 71.7* 74.6* 71.5* 73.2* 72.5*  PLT 347 334 337 354 281    Cardiac Enzymes: Recent Labs  Lab 02/06/19 0852  CKTOTAL 34*    BNP: Invalid input(s): POCBNP  CBG: No results for input(s): GLUCAP in the last 168 hours.  Microbiology: Results for orders placed or performed during the hospital encounter of 01/30/19  Gram stain     Status: None   Collection Time: 01/30/19  8:49 AM   Specimen: Abscess  Result Value Ref Range Status   Specimen Description ABSCESS  Final   Special Requests NONE  Final   Gram Stain  Final    MODERATE WBC SEEN MANY GRAM POSITIVE COCCI FEW GRAM NEGATIVE RODS FEW GRAM POSITIVE RODS RESULT CALLED TO, READ BACK BY AND VERIFIED WITH: DR. Dahlia Byes AT 1030 01/30/2019 SDR Performed at [facility], [address], [place], [place] [address]    Report Status 01/30/2019 FINAL  Final  Aerobic/Anaerobic Culture (surgical/deep wound)     Status: None   Collection Time: 01/30/19  8:50 AM   Specimen: Abscess; Wound  Result Value Ref Range Status   Specimen Description ABSCESS PELVIS 2  Final   Special Requests NONE  Final   Gram Stain   Final    NO WBC SEEN RARE GRAM POSITIVE COCCI RARE GRAM POSITIVE RODS    Culture   Final    FEW ESCHERICHIA COLI MODERATE VIRIDANS STREPTOCOCCUS MODERATE PREVOTELLA BUCCAE BETA LACTAMASE POSITIVE Performed at [facility], [place] [address] [place], [place] [address]    Report Status 02/04/2019 FINAL  Final   Organism ID, Bacteria ESCHERICHIA COLI  Final      Susceptibility   Escherichia coli - MIC*     AMPICILLIN >=32 RESISTANT Resistant     CEFAZOLIN <=4 SENSITIVE Sensitive     CEFEPIME <=1 SENSITIVE Sensitive     CEFTAZIDIME <=1 SENSITIVE Sensitive     CEFTRIAXONE <=1 SENSITIVE Sensitive     CIPROFLOXACIN <=0.25 SENSITIVE Sensitive     GENTAMICIN >=16 RESISTANT Resistant     IMIPENEM <=0.25 SENSITIVE Sensitive     TRIMETH/SULFA >=320 RESISTANT Resistant     AMPICILLIN/SULBACTAM 16 INTERMEDIATE Intermediate     PIP/TAZO <=4 SENSITIVE Sensitive     Extended ESBL NEGATIVE Sensitive     * FEW ESCHERICHIA COLI    Coagulation Studies: No results for input(s): LABPROT, INR in the last 72 hours.  Urinalysis: Recent Labs    02/05/19 1602  COLORURINE STRAW*  LABSPEC 1.005  PHURINE 5.0  GLUCOSEU NEGATIVE  HGBUR NEGATIVE  BILIRUBINUR NEGATIVE  KETONESUR NEGATIVE  PROTEINUR NEGATIVE  NITRITE NEGATIVE  LEUKOCYTESUR TRACE*      Imaging: No results found.   Medications:   . sodium chloride 40 mL/hr at 02/07/19 0338  . sodium chloride Stopped (02/07/19 0008)  . piperacillin-tazobactam (ZOSYN)  IV 3.375 g (02/07/19 0920)   . pantoprazole (PROTONIX) IV  40 mg Intravenous Q12H   sodium chloride, hydrALAZINE, morphine injection, ondansetron **OR** ondansetron (ZOFRAN) IV, oxyCODONE, phenol, prochlorperazine **OR** prochlorperazine  Assessment/ Plan:  38 y.o. male with a PMHx of GERD, asthma, recent right colonic mass who was admitted to [facility] on 01/30/2019 for right extended colectomy.   1.  Acute kidney injury baseline Cr 0.89, Cr currently 2.09/proteinuria. 2.  Acute blood loss anemia, hemoglobin 6.1 3.  Newly diagnosed colon cancer.   Plan:  Creatinine still higher than expected at 2.0.  He does not appear to have rhabdomyolysis with CK currently 34.  As before he did have 1 isolated elevated level of vancomycin at 43 but subsequent ones have been lower.  Patient also noted to be on Zosyn which at times can lead to renal insufficiency.  In addition his hemoglobin is down  again to 6.1.  Patient received blood transfusion.  Therefore volume loss may be playing some role in his underlying acute kidney injury.  Patient also had contrast earlier in the month but doubt it has much clinical relevance now.  We will stop IV fluid hydration now to make sure that dilution is not playing a significant role.   LOS: 8 Intisar Claudio  11/28/20203:24 PM

## 2019-02-08 ENCOUNTER — Other Ambulatory Visit: Payer: Self-pay | Admitting: Surgery

## 2019-02-08 LAB — TYPE AND SCREEN
ABO/RH(D): O POS
Antibody Screen: NEGATIVE
Unit division: 0

## 2019-02-08 LAB — CBC WITH DIFFERENTIAL/PLATELET
Abs Immature Granulocytes: 0.21 10*3/uL — ABNORMAL HIGH (ref 0.00–0.07)
Basophils Absolute: 0 10*3/uL (ref 0.0–0.1)
Basophils Relative: 1 %
Eosinophils Absolute: 0.3 10*3/uL (ref 0.0–0.5)
Eosinophils Relative: 3 %
HCT: 23.6 % — ABNORMAL LOW (ref 39.0–52.0)
Hemoglobin: 7.8 g/dL — ABNORMAL LOW (ref 13.0–17.0)
Immature Granulocytes: 2 %
Lymphocytes Relative: 12 %
Lymphs Abs: 1.1 10*3/uL (ref 0.7–4.0)
MCH: 24.1 pg — ABNORMAL LOW (ref 26.0–34.0)
MCHC: 33.1 g/dL (ref 30.0–36.0)
MCV: 73.1 fL — ABNORMAL LOW (ref 80.0–100.0)
Monocytes Absolute: 0.9 10*3/uL (ref 0.1–1.0)
Monocytes Relative: 10 %
Neutro Abs: 6.2 10*3/uL (ref 1.7–7.7)
Neutrophils Relative %: 72 %
Platelets: 295 10*3/uL (ref 150–400)
RBC: 3.23 MIL/uL — ABNORMAL LOW (ref 4.22–5.81)
RDW: 21.6 % — ABNORMAL HIGH (ref 11.5–15.5)
Smear Review: NORMAL
WBC: 8.7 10*3/uL (ref 4.0–10.5)
nRBC: 0 % (ref 0.0–0.2)

## 2019-02-08 LAB — BASIC METABOLIC PANEL
Anion gap: 11 (ref 5–15)
BUN: 7 mg/dL (ref 6–20)
CO2: 26 mmol/L (ref 22–32)
Calcium: 8.5 mg/dL — ABNORMAL LOW (ref 8.9–10.3)
Chloride: 101 mmol/L (ref 98–111)
Creatinine, Ser: 1.86 mg/dL — ABNORMAL HIGH (ref 0.61–1.24)
GFR calc Af Amer: 52 mL/min — ABNORMAL LOW (ref >60)
GFR calc non Af Amer: 45 mL/min — ABNORMAL LOW (ref >60)
Glucose, Bld: 105 mg/dL — ABNORMAL HIGH (ref 70–99)
Potassium: 3.4 mmol/L — ABNORMAL LOW (ref 3.5–5.1)
Sodium: 138 mmol/L (ref 135–145)

## 2019-02-08 LAB — MAGNESIUM: Magnesium: 2.1 mg/dL (ref 1.7–2.4)

## 2019-02-08 LAB — PHOSPHORUS: Phosphorus: 4.4 mg/dL (ref 2.5–4.6)

## 2019-02-08 LAB — BPAM RBC
Blood Product Expiration Date: 202012032359
ISSUE DATE / TIME: 202011281200
Unit Type and Rh: 5100

## 2019-02-08 MED ORDER — ACETAMINOPHEN 325 MG PO TABS: 650.0000 mg | ORAL_TABLET | Freq: Three times a day (TID) | ORAL | 0 refills | Status: AC | PRN

## 2019-02-08 MED ORDER — HYDROCODONE-ACETAMINOPHEN 5-325 MG PO TABS: 1.0000 | ORAL_TABLET | Freq: Four times a day (QID) | ORAL | 0 refills | Status: DC | PRN

## 2019-02-08 MED ORDER — DOCUSATE SODIUM 100 MG PO CAPS: 100.0000 mg | ORAL_CAPSULE | Freq: Two times a day (BID) | ORAL | 0 refills | Status: AC | PRN

## 2019-02-08 NOTE — Discharge Summary (Signed)
Physician Discharge Summary  Patient ID: Tristan [name] MRN: [id number] DOB/AGE: 1980-05-21 38 y.o.  Admit date: 01/30/2019 Discharge date: 02/08/2019  Admission Diagnoses: Colon mass  Discharge Diagnoses:  Same as above plus, AKI, anemia, intra-abdominal abscess  Discharged Condition: good  Hospital Course: Admitted for elective resection of colon mass.  Noted to have a intra-abdominal abscess during the procedure.  Please see op note for further details.  Pathology came back as colon cancer.  Oncology referral made.  Postop, patient was started on IV antibiotics.  Noted to have acute kidney injury as well as a steadily decreasing hemoglobin which required transfusions.  The increasing creatinine level eventually did taper off and then start to decrease by time of discharge.  Patient otherwise advance diet without any major issues.  At time of discharge hemoglobin level responded appropriately to the transfusions, no clinical sign of continued bleeding.  JP drainage continues to look like an old hematoma.  The midline wound which was left open was switched over to wet-to-dry dressing changes due to insurance issues not being able to cover a wound VAC.  Patient will follow up with oncology as well as surgery and nephrology for continued follow-up.  Consults: nephrology and hematology/oncology  Discharge Exam: Blood pressure 110/62, pulse 83, temperature 98 F (36.7 C), temperature source Oral, resp. rate 14, height 6\' 1"  (1.854 m), weight 77.3 kg, SpO2 99 %. General appearance: alert, cooperative and no distress GI: soft, non-tender; bowel sounds normal; no masses,  no organomegaly and drains with serous drainage and old hematoma drainage. midline wound clean, with pink granulation tissue  Disposition:  Discharge disposition: 01-Home or Self Care       Discharge Instructions    Discharge patient   Complete by: As directed    Discharge disposition: 01-Home or Self Care   Discharge  patient date: 02/08/2019     Allergies as of 02/08/2019   No Known Allergies     Medication List    STOP taking these medications   albuterol 108 (90 Base) MCG/ACT inhaler Commonly known as: VENTOLIN HFA   bisacodyl 5 MG EC tablet Commonly known as: DULCOLAX   erythromycin base 500 MG tablet Commonly known as: E-MYCIN   neomycin 500 MG tablet Commonly known as: MYCIFRADIN   polyethylene glycol powder 17 GM/SCOOP powder Commonly known as: MiraLax     TAKE these medications   acetaminophen 325 MG tablet Commonly known as: Tylenol Take 2 tablets (650 mg total) by mouth every 8 (eight) hours as needed for mild pain.   calcium carbonate 500 MG chewable tablet Commonly known as: TUMS - dosed in mg elemental calcium Chew 2 tablets by mouth 2 (two) times daily as needed for indigestion or heartburn.   docusate sodium 100 MG capsule Commonly known as: Colace Take 1 capsule (100 mg total) by mouth 2 (two) times daily as needed for up to 10 days for mild constipation.   gabapentin 300 MG capsule Commonly known as: Neurontin Take 1 capsule (300 mg total) by mouth 3 (three) times daily.   HYDROcodone-acetaminophen 5-325 MG tablet Commonly known as: Norco Take 1 tablet by mouth every 6 (six) hours as needed for up to 6 doses for moderate pain. What changed:   when to take this  reasons to take this   ketorolac 10 MG tablet Commonly known as: TORADOL Take 1 tablet (10 mg total) by mouth every 6 (six) hours as needed.      Follow-up Information    Rodenberg,  Sharlet Salina, MD Follow up on 02/12/2019.   Specialty: Surgery Why: for wound check and possible JP drain removal Contact information: [place] [address] [phone]        Anthonette Legato, MD Follow up in 1 week(s).   Specialty: Nephrology Why: f/u on creatinine level Contact information: [place] [address] [phone]            Total time spent  arranging discharge was >71min. Signed: Benjamine Sprague 02/08/2019, 1:40 PM

## 2019-02-08 NOTE — Discharge Instructions (Signed)
Laparoscopic Colectomy, Care After This sheet gives you information about how to care for yourself after your procedure. Your health care provider may also give you more specific instructions. If you have problems or questions, contact your health care provider. What can I expect after the procedure? After your procedure, it is common to have the following:  Pain in your abdomen, especially in the incision areas. You will be given medicine to control the pain.  Tiredness. This is a normal part of the recovery process. Your energy level will return to normal over the next several weeks.  Changes in your bowel movements, such as constipation or needing to go more often. Talk with your health care provider about how to manage this. Follow these instructions at home: Medicines   tylenol and as needed for discomfort.     Use narcotics, if prescribed, only when tylenol and toradol is not enough to control pain.   325-650mg  every 8hrs to max of 4000mg /24hrs (including the 325mg  in every norco dose) for the tylenol.     Do not drive or use heavy machinery while taking prescription pain medicine.  Do not drink alcohol while taking prescription pain medicine.  If you were prescribed an antibiotic medicine, use it as told by your health care provider. Do not stop using the antibiotic even if you start to feel better. Incision care     Follow instructions from your health care provider about how to take care of your incision areas. Make sure you: ? Keep your incisions clean and dry. ?  continue daily wet to dry dressing changes as discussed in clinic.  Wet 4x4 gauze and  gently pack the wound as tolerated.  Remove before showering and allow soap and water to run over it.  Pat dry and then repack daily. ? Change your dressing as told by your health care provider. ? Leave stitches (sutures), skin glue, or adhesive strips in place. These skin closures may need to stay in place for 2 weeks or longer.  If adhesive strip edges start to loosen and curl up, you may trim the loose edges. Do not remove adhesive strips completely unless your health care provider tells you to do that.  Do not wear tight clothing over the incisions. Tight clothing may rub and irritate the incision areas, which may cause the incisions to open.  Do not take baths, swim, or use a hot tub until your health care provider approves. OK TO SHOWER.    Check your incision area every day for signs of infection. Check for: ? More redness, swelling, or pain. ? More fluid or blood. ? Warmth. ? Pus or a bad smell. Activity  Avoid lifting anything that is heavier than 10 lb (4.5 kg) for 2 weeks or until your health care provider says it is okay.  You may resume normal activities as told by your health care provider. Ask your health care provider what activities are safe for you.  Take rest breaks during the day as needed. Eating and drinking  Follow instructions from your health care provider about what you can eat after surgery.  To prevent or treat constipation while you are taking prescription pain medicine, your health care provider may recommend that you: ? Drink enough fluid to keep your urine clear or pale yellow. ? Take over-the-counter or prescription medicines. ? Eat foods that are high in fiber, such as fresh fruits and vegetables, whole grains, and beans. ? Limit foods that are high in fat  and processed sugars, such as fried and sweet foods. General instructions  Ask your health care provider when you will need an appointment to get your sutures or staples removed.  Keep all follow-up visits as told by your health care provider. This is important. Contact a health care provider if:  You have more redness, swelling, or pain around your incisions.  You have more fluid or blood coming from the incisions.  Your incisions feel warm to the touch.  You have pus or a bad smell coming from your incisions or your  dressing.  You have a fever.  You have an incision that breaks open (edges not staying together) after sutures or staples have been removed. Get help right away if:  You develop a rash.  You have chest pain or difficulty breathing.  You have pain or swelling in your legs.  You feel light-headed or you faint.  Your abdomen swells (becomes distended).  You have nausea or vomiting.  You have blood in your stool (feces). This information is not intended to replace advice given to you by your health care provider. Make sure you discuss any questions you have with your health care provider. Document Released: 09/15/2004 Document Revised: 11/15/2017 Document Reviewed: 11/28/2015 Elsevier Interactive Patient Education  2019 [employer].

## 2019-02-08 NOTE — Progress Notes (Signed)
[facility]  ROUNDING NOTE   Subjective:  Urine output was 1.2 L over the preceding 24 hours. Creatinine down to 1.86. Hemoglobin up to 7.8 posttransfusion. Case discussed with surgery today.   Objective:  Vital signs in last 24 hours:  Temp:  [98 F (36.7 C)-98.8 F (37.1 C)] 98 F (36.7 C) (11/29 [id number]) Pulse Rate:  [83-92] 83 (11/29 0611) Resp:  [14-18] 14 (11/29 0611) BP: (107-127)/(62-69) 110/62 (11/29 0611) SpO2:  [97 %-99 %] 99 % (11/29 0611)  Weight change:  Filed Weights   01/30/19 0629 02/01/19 1346  Weight: 79.4 kg 77.3 kg    Intake/Output: I/O last 3 completed shifts: In: 4096.2 [P.O.:480; I.V.:2835.2; Blood:430; IV [id number] Out: [id number] [Urine:1400; Drains:90]   Intake/Output this shift:  Total I/O In: 240 [P.O.:240] Out: 25 [Drains:25]  Physical Exam: General: No acute distress  Head: Normocephalic, atraumatic. Moist oral mucosal membranes  Eyes: Anicteric  Neck: Supple, trachea midline  Lungs:  Clear to auscultation, normal effort  Heart: S1S2 no rubs  Abdomen:  Soft, JP and wound VAC in place  Extremities: Trace peripheral edema.  Neurologic: Awake, alert, following commands  Skin: No lesions       Basic Metabolic Panel: Recent Labs  Lab 02/04/19 0447 02/05/19 0515 02/06/19 0852 02/07/19 0454 02/08/19 0553  NA 139 138 137 136 138  K 4.0 3.7 3.8 3.6 3.4*  CL 105 104 101 101 101  CO2 26 25 26 28 26   GLUCOSE 102* 91 124* 98 105*  BUN 11 9 7 7 7   CREATININE 1.99* 1.96* 2.09* 2.00* 1.86*  CALCIUM 7.8* 8.0* 8.3* 8.0* 8.5*  MG  --  2.6*  --  2.0 2.1  PHOS  --  4.5 4.4 4.3 4.4    Liver Function Tests: Recent Labs  Lab 02/06/19 0852  ALBUMIN 2.2*   No results for input(s): LIPASE, AMYLASE in the last 168 hours. No results for input(s): AMMONIA in the last 168 hours.  CBC: Recent Labs  Lab 02/04/19 0447 02/05/19 0515 02/06/19 0852 02/07/19 0454 02/07/19 1614 02/08/19 0553  WBC 7.6 7.5 9.4 8.5  --  8.7   NEUTROABS  --   --  7.0 5.8  --  6.2  HGB 6.2* 6.4* 7.2* 6.1* 8.2* 7.8*  HCT 20.6* 20.1* 22.9* 19.5* 24.9* 23.6*  MCV 74.6* 71.5* 73.2* 72.5*  --  73.1*  PLT 334 337 354 281  --  295    Cardiac Enzymes: Recent Labs  Lab 02/06/19 0852  CKTOTAL 34*    BNP: Invalid input(s): POCBNP  CBG: No results for input(s): GLUCAP in the last 168 hours.  Microbiology: Results for orders placed or performed during the hospital encounter of 01/30/19  Gram stain     Status: None   Collection Time: 01/30/19  8:49 AM   Specimen: Abscess  Result Value Ref Range Status   Specimen Description ABSCESS  Final   Special Requests NONE  Final   Gram Stain   Final    MODERATE WBC SEEN MANY GRAM POSITIVE COCCI FEW GRAM NEGATIVE RODS FEW GRAM POSITIVE RODS RESULT CALLED TO, READ BACK BY AND VERIFIED WITH: DR. Dahlia Byes AT 1030 01/30/2019 SDR Performed at [facility], [address] [place], [place] [address]    Report Status 01/30/2019 FINAL  Final  Aerobic/Anaerobic Culture (surgical/deep wound)     Status: None   Collection Time: 01/30/19  8:50 AM   Specimen: Abscess; Wound  Result Value Ref Range Status   Specimen Description ABSCESS  PELVIS 2  Final   Special Requests NONE  Final   Gram Stain   Final    NO WBC SEEN RARE GRAM POSITIVE COCCI RARE GRAM POSITIVE RODS    Culture   Final    FEW ESCHERICHIA COLI MODERATE VIRIDANS STREPTOCOCCUS MODERATE PREVOTELLA BUCCAE BETA LACTAMASE POSITIVE Performed at [facility], [place] [address], [place], [place] [address]    Report Status 02/04/2019 FINAL  Final   Organism ID, Bacteria ESCHERICHIA COLI  Final      Susceptibility   Escherichia coli - MIC*    AMPICILLIN >=32 RESISTANT Resistant     CEFAZOLIN <=4 SENSITIVE Sensitive     CEFEPIME <=1 SENSITIVE Sensitive     CEFTAZIDIME <=1 SENSITIVE Sensitive     CEFTRIAXONE <=1 SENSITIVE Sensitive     CIPROFLOXACIN <=0.25 SENSITIVE Sensitive     GENTAMICIN >=16 RESISTANT Resistant      IMIPENEM <=0.25 SENSITIVE Sensitive     TRIMETH/SULFA >=320 RESISTANT Resistant     AMPICILLIN/SULBACTAM 16 INTERMEDIATE Intermediate     PIP/TAZO <=4 SENSITIVE Sensitive     Extended ESBL NEGATIVE Sensitive     * FEW ESCHERICHIA COLI    Coagulation Studies: No results for input(s): LABPROT, INR in the last 72 hours.  Urinalysis: Recent Labs    02/05/19 1602  COLORURINE STRAW*  LABSPEC 1.005  PHURINE 5.0  GLUCOSEU NEGATIVE  HGBUR NEGATIVE  BILIRUBINUR NEGATIVE  KETONESUR NEGATIVE  PROTEINUR NEGATIVE  NITRITE NEGATIVE  LEUKOCYTESUR TRACE*      Imaging: No results found.   Medications:   . sodium chloride 250 mL (02/08/19 0919)  . piperacillin-tazobactam (ZOSYN)  IV 3.375 g (02/08/19 0920)   . pantoprazole (PROTONIX) IV  40 mg Intravenous Q12H   sodium chloride, hydrALAZINE, morphine injection, ondansetron **OR** ondansetron (ZOFRAN) IV, oxyCODONE, phenol, prochlorperazine **OR** prochlorperazine  Assessment/ Plan:  38 y.o. male with a PMHx of GERD, asthma, recent right colonic mass who was admitted to [facility] on 01/30/2019 for right extended colectomy.   1.  Acute kidney injury baseline Cr 0.89, Cr currently 1.86/proteinuria. 2.  Acute blood loss anemia, hemoglobin up to 7.8 posttransfusion 3.  Newly diagnosed colon cancer.   Plan:  Renal parameters have begun to improve.  Creatinine down to 1.86.  IV fluids have been stopped.  He appears to be taking good p.o. intake at the moment.  Case discussed with surgery today.  Outpatient follow-up is being considered.  We would certainly like to follow the patient closely in our office after discharge.  We will arrange for this follow-up within the next week.  He will need close monitoring of his hemoglobin by surgery as well.  Otherwise plan as per primary team.   LOS: 9 Kenlynn Houde 11/29/202012:44 PM

## 2019-02-09 ENCOUNTER — Other Ambulatory Visit: Payer: Self-pay | Admitting: Physician Assistant

## 2019-02-09 ENCOUNTER — Inpatient Hospital Stay: Payer: PRIVATE HEALTH INSURANCE

## 2019-02-09 ENCOUNTER — Inpatient Hospital Stay: Payer: PRIVATE HEALTH INSURANCE | Attending: Oncology | Admitting: Oncology

## 2019-02-09 ENCOUNTER — Other Ambulatory Visit: Payer: Self-pay

## 2019-02-09 ENCOUNTER — Telehealth: Payer: Self-pay

## 2019-02-09 ENCOUNTER — Encounter: Payer: Self-pay | Admitting: Oncology

## 2019-02-09 VITALS — BP 132/87 | HR 103 | Temp 97.3°F | Ht 73.0 in | Wt 184.0 lb

## 2019-02-09 DIAGNOSIS — F17210 Nicotine dependence, cigarettes, uncomplicated: Secondary | ICD-10-CM | POA: Insufficient documentation

## 2019-02-09 DIAGNOSIS — Z79899 Other long term (current) drug therapy: Secondary | ICD-10-CM | POA: Insufficient documentation

## 2019-02-09 DIAGNOSIS — C189 Malignant neoplasm of colon, unspecified: Secondary | ICD-10-CM

## 2019-02-09 DIAGNOSIS — Z7189 Other specified counseling: Secondary | ICD-10-CM | POA: Insufficient documentation

## 2019-02-09 DIAGNOSIS — R5381 Other malaise: Secondary | ICD-10-CM | POA: Diagnosis not present

## 2019-02-09 DIAGNOSIS — R5383 Other fatigue: Secondary | ICD-10-CM | POA: Insufficient documentation

## 2019-02-09 DIAGNOSIS — D509 Iron deficiency anemia, unspecified: Secondary | ICD-10-CM | POA: Diagnosis not present

## 2019-02-09 DIAGNOSIS — K219 Gastro-esophageal reflux disease without esophagitis: Secondary | ICD-10-CM | POA: Diagnosis not present

## 2019-02-09 DIAGNOSIS — C188 Malignant neoplasm of overlapping sites of colon: Secondary | ICD-10-CM | POA: Insufficient documentation

## 2019-02-09 DIAGNOSIS — Z9049 Acquired absence of other specified parts of digestive tract: Secondary | ICD-10-CM | POA: Diagnosis not present

## 2019-02-09 DIAGNOSIS — R600 Localized edema: Secondary | ICD-10-CM | POA: Insufficient documentation

## 2019-02-09 LAB — IRON AND TIBC
Iron: 22 ug/dL — ABNORMAL LOW (ref 45–182)
Saturation Ratios: 9 % — ABNORMAL LOW (ref 17.9–39.5)
TIBC: 243 ug/dL — ABNORMAL LOW (ref 250–450)
UIBC: 221 ug/dL

## 2019-02-09 LAB — VITAMIN B12: Vitamin B-12: 384 pg/mL (ref 180–914)

## 2019-02-09 LAB — FERRITIN: Ferritin: 155 ng/mL (ref 24–336)

## 2019-02-09 LAB — FOLATE: Folate: 6 ng/mL (ref >5.9)

## 2019-02-09 MED ORDER — HYDROCODONE-ACETAMINOPHEN 5-325 MG PO TABS: 1.0000 | ORAL_TABLET | Freq: Four times a day (QID) | ORAL | 0 refills | Status: DC | PRN

## 2019-02-09 NOTE — Progress Notes (Signed)
Patient is here today to establish care for colon mass.

## 2019-02-09 NOTE — Telephone Encounter (Signed)
Spoke with patient. Confirmed 02/12/2019 appointment with Dr.Rodenberg. Patient to continue keeping feet elevated and recording output of drains.

## 2019-02-09 NOTE — Telephone Encounter (Signed)
Patient also wanted me to mention that he has bilateral lower extremity edema since yesterday. Patient stated that he elevated his feet yesterday and that it helped some but didn't take care of the edema.

## 2019-02-09 NOTE — Telephone Encounter (Signed)
Patient came in to see Dr. Janese Banks and he stated that he did not have a post-op appointment. Patient stated that his drain output from drain # 1 was 5 cc and drain # 2 was 15 cc within 24 hours. Patient stated that he was having pain but that he was getting a refill on his pain medication (saw the phone note). Can you please contact patient when you have a chance. Thank you.

## 2019-02-09 NOTE — Telephone Encounter (Signed)
Patient requesting refill on hydrocodone. Would also like his toradol filled as well. He states he is taking the tylenol that was prescribed and its not helping especially with dressing changes.  Spoke with Zach-hold on toradol, however Zach sending Rx to drugstore. Patient notified.

## 2019-02-10 ENCOUNTER — Encounter: Payer: Self-pay | Admitting: Oncology

## 2019-02-10 DIAGNOSIS — Z7189 Other specified counseling: Secondary | ICD-10-CM | POA: Insufficient documentation

## 2019-02-10 DIAGNOSIS — C188 Malignant neoplasm of overlapping sites of colon: Secondary | ICD-10-CM | POA: Insufficient documentation

## 2019-02-10 DIAGNOSIS — D509 Iron deficiency anemia, unspecified: Secondary | ICD-10-CM | POA: Insufficient documentation

## 2019-02-10 LAB — CEA: CEA: 2.1 ng/mL (ref 0.0–4.7)

## 2019-02-10 LAB — SURGICAL PATHOLOGY

## 2019-02-10 NOTE — Progress Notes (Signed)
Hematology/Oncology Consult note [facility] Telephone:([phone] [phone]  Patient Care Team: Patient, No Pcp Per as PCP - General (General Practice) Clent Jacks, RN as Oncology Nurse Navigator   Name of the patient: Tristan [name]  [id number]  October 12, 1980    Reason for referral-new diagnosis of colon cancer   Referring physician- Dr. [name]  Date of visit: 02/10/19   History of presenting illness-patient is a 38 year old male who saw Dr. Vicente Males back in July 2018 for abdominal pain.  At that time CT showed inflammation of the sigmoid colon extending into the rectum.  He was given a course of antibiotics and was asked to follow-up with GI.  A colonoscopy was attempted at that time but was very hard to get past the sigmoid colon as it was tight and tortuous.  Rectal biopsies at that time were normal.  CT colonography and barium enema was recommended but patient was lost to follow-up.  He then presented to the emergency room in August 2020 with symptoms of right lower quadrant abdominal pain.  CT showed masslike thickening involving the cecum and the ascending colon the terminal ileum with surrounding inflammatory changes differentials included infectious/inflammatory enterocolitis as well as colonic neoplasm.  Since it was difficult to do a colonoscopy a barium enema was performed on 01/09/2019 which showed a large irregular apple core lesion involving the lower portion of the right colon.  Since it was difficult for the patient to undergo colonoscopy he was referred to Dr. Adora Fridge for definitive surgical management.  Patient underwent right hemicolectomy on 01/30/2019.  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  With invasion of visceral peritoneum with findings of peritonitis adhesions and abscesses.  Metastatic carcinoma in 2 out of 245 regional lymph nodes.  Margins were negative.   PT4PN1.  Appendix encased in adhesions with adjacent abscess.  MSI testing showed low probability of MSI high. 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  Ki-67 less than 3%  Patient reports he is slowly recovering from his surgery.  He still has bilateral lower extremity swelling which is improving with leg elevation.  He has abdominal drains in place.  Reports that his bowels are moving regularly.  Denies any fever  ECOG PS- 1  Pain scale- 4   Review of systems- Review of Systems  Constitutional: Positive for malaise/fatigue. Negative for chills, fever and weight loss.  HENT: Negative for congestion, ear discharge and nosebleeds.   Eyes: Negative for blurred vision.  Respiratory: Negative for cough, hemoptysis, sputum production, shortness of breath and wheezing.   Cardiovascular: Positive for leg swelling. Negative for chest pain, palpitations, orthopnea and claudication.  Gastrointestinal: Positive for abdominal pain. Negative for blood in stool, constipation, diarrhea, heartburn, melena, nausea and vomiting.  Genitourinary: Negative for dysuria, flank pain, frequency, hematuria and urgency.  Musculoskeletal: Negative for back pain, joint pain and myalgias.  Skin: Negative for rash.  Neurological: Negative for dizziness, tingling, focal weakness, seizures, weakness and headaches.  Endo/Heme/Allergies: Does not bruise/bleed easily.  Psychiatric/Behavioral: Negative for depression and suicidal ideas. The patient does not have insomnia.     No Known Allergies  Patient Active Problem List   Diagnosis Date Noted   Goals of care, counseling/discussion 02/10/2019   Protein-calorie malnutrition, severe 02/01/2019   Mass of colon 01/30/2019   Genital warts 07/15/2015     Past Medical History:  Diagnosis Date   Abdominal pain  Asthma    GERD (gastroesophageal reflux disease)    Nausea    Poor appetite    Weight loss       Past Surgical History:  Procedure Laterality Date   COLONOSCOPY N/A 01/30/2019   Procedure: COLONOSCOPY;  Surgeon: Jules Husbands, MD;  Location: [facility] ORS;  Service: General;  Laterality: N/A;   COLONOSCOPY WITH PROPOFOL N/A 08/16/2016   Procedure: COLONOSCOPY WITH PROPOFOL;  Surgeon: [name], MD;  Location: [facility] ENDOSCOPY;  Service: Endoscopy;  Laterality: N/A;   COLONOSCOPY WITH PROPOFOL N/A 01/30/2019   Procedure: COLONOSCOPY WITH PROPOFOL;  Surgeon: [name], MD;  Location: [facility] ENDOSCOPY;  Service: Gastroenterology;  Laterality: N/A;  TRAVEL CASE TO O.R. - PROCEDURE TO START AT 7:30 AM IN O.R.   LAPAROSCOPIC RIGHT COLECTOMY N/A 01/30/2019   Procedure: LAPAROSCOPIC HAND ASSISTED RIGHT COLECTOMY CONVERTED TO OPEN PROCEDURE;  Surgeon: Jules Husbands, MD;  Location: [facility] ORS;  Service: General;  Laterality: N/A;   NO PAST SURGERIES      Social History   Socioeconomic History   Marital status: Single    Spouse name: Not on file   Number of children: Not on file   Years of education: Not on file   Highest education level: Not on file  Occupational History   Not on file  Social Needs   Financial resource strain: Not on file   Food insecurity    Worry: Not on file    Inability: Not on file   Transportation needs    Medical: Not on file    Non-medical: Not on file  Tobacco Use   Smoking status: Current Every Day Smoker    Packs/day: 0.50    Types: Cigarettes   Smokeless tobacco: Never Used  Substance and Sexual Activity   Alcohol use: Not Currently   Drug use: Yes    Types: Marijuana    Comment: last week   Sexual activity: Yes    Birth control/protection: Condom  Lifestyle   Physical activity    Days per week: Not on file    Minutes per session: Not on file   Stress: Not on file  Relationships   Social connections    Talks on phone: Not on file    Gets together: Not on file    Attends religious service: Not on file    Active member  of club or organization: Not on file    Attends meetings of clubs or organizations: Not on file    Relationship status: Not on file   Intimate partner violence    Fear of current or ex partner: Not on file    Emotionally abused: Not on file    Physically abused: Not on file    Forced sexual activity: Not on file  Other Topics Concern   Not on file  Social History Narrative   Not on file     Family History  Problem Relation Age of Onset   Diabetes Mother 64       Type 1   Healthy Father    Colon polyps Father    Colon cancer Other 56   Breast cancer Other 43   Ulcerative colitis Paternal Aunt      Current Outpatient Medications:    acetaminophen (TYLENOL) 325 MG tablet, Take 2 tablets (650 mg total) by mouth every 8 (eight) hours as needed for mild pain., Disp: 40 tablet, Rfl: 0   calcium carbonate (TUMS - DOSED IN MG ELEMENTAL CALCIUM) 500 MG chewable tablet,  Chew 2 tablets by mouth 2 (two) times daily as needed for indigestion or heartburn., Disp: , Rfl:    docusate sodium (COLACE) 100 MG capsule, Take 1 capsule (100 mg total) by mouth 2 (two) times daily as needed for up to 10 days for mild constipation., Disp: 20 capsule, Rfl: 0   gabapentin (NEURONTIN) 300 MG capsule, Take 1 capsule (300 mg total) by mouth 3 (three) times daily., Disp: 30 capsule, Rfl: 1   HYDROcodone-acetaminophen (NORCO) 5-325 MG tablet, Take 1 tablet by mouth every 6 (six) hours as needed for moderate pain or severe pain (For dressing changes)., Disp: 25 tablet, Rfl: 0   Physical exam:  Vitals:   02/09/19 1338  BP: 132/87  Pulse: (!) 103  Temp: (!) 97.3 F (36.3 C)  TempSrc: Tympanic  Weight: 184 lb (83.5 kg)  Height: '6\' 1"'  (1.854 m)   Physical Exam Constitutional:      General: He is not in acute distress. HENT:     Head: Normocephalic and atraumatic.  Eyes:     Pupils: Pupils are equal, round, and reactive to light.  Neck:     Musculoskeletal: Normal range of motion.   Cardiovascular:     Rate and Rhythm: Normal rate and regular rhythm.     Heart sounds: Normal heart sounds.  Pulmonary:     Effort: Pulmonary effort is normal.     Breath sounds: Normal breath sounds.  Abdominal:     General: Bowel sounds are normal.     Palpations: Abdomen is soft.     Comments: Dressing in place over abdominal wall.  2 JP drains in place  Musculoskeletal:     Comments: Bilateral +1 edema  Skin:    General: Skin is warm and dry.  Neurological:     Mental Status: He is alert and oriented to person, place, and time.        CMP Latest Ref Rng & Units 02/08/2019  Glucose 70 - 99 mg/dL 105(H)  BUN 6 - 20 mg/dL 7  Creatinine 0.61 - 1.24 mg/dL 1.86(H)  Sodium 135 - 145 mmol/L 138  Potassium 3.5 - 5.1 mmol/L 3.4(L)  Chloride 98 - 111 mmol/L 101  CO2 22 - 32 mmol/L 26  Calcium 8.9 - 10.3 mg/dL 8.5(L)  Total Protein 6.5 - 8.1 g/dL -  Total Bilirubin 0.3 - 1.2 mg/dL -  Alkaline Phos 38 - 126 U/L -  AST 15 - 41 U/L -  ALT 0 - 44 U/L -   CBC Latest Ref Rng & Units 02/08/2019  WBC 4.0 - 10.5 K/uL 8.7  Hemoglobin 13.0 - 17.0 g/dL 7.8(L)  Hematocrit 39.0 - 52.0 % 23.6(L)  Platelets 150 - 400 K/uL 295    No images are attached to the encounter.  Ct Abdomen Pelvis W Contrast  Result Date: 01/21/2019 CLINICAL DATA:  Acute abdominal pain over the lower abdomen 4-5 months. EXAM: CT ABDOMEN AND PELVIS WITH CONTRAST TECHNIQUE: Multidetector CT imaging of the abdomen and pelvis was performed using the standard protocol following bolus administration of intravenous contrast. CONTRAST:  167m OMNIPAQUE IOHEXOL 300 MG/ML  SOLN COMPARISON:  11/01/2018 and 07/02/2016 FINDINGS: Lower chest: Lung bases are clear. Hepatobiliary: Liver, gallbladder and biliary tree are normal. Gallbladder somewhat contracted. Pancreas: Normal. Spleen: Normal. Adrenals/Urinary Tract: Adrenal glands are normal. Kidneys normal size without hydronephrosis or nephrolithiasis. Ureters and bladder are  normal. Stomach/Bowel: Stomach is normal. There are dilated proximal and distal small bowel loops. There are several small bowel loops in  the right lower quadrant with wall thickening likely secondary to an adjacent inflammatory process in the right lower quadrant. Appendix is inflamed measuring 1.3 cm in diameter with mucosal enhancement. Colon is decompressed. Again noted is masslike soft tissue density over the the region of the ileocecal valve and cecum as this may be causing a degree of obstruction. No evidence of perforation. There is moderate adjacent inflammatory change in the mesenteric fat over the right lower quadrant. Small amount of adjacent free fluid. These are complex findings in the right lower quadrant as there appears to be infectious component. There appears to be a process involving the ileocecal region which may be infectious or inflammatory nature and less likely neoplastic. There may be secondary involvement of the appendix with acute appendicitis. There is an adjacent rim enhancing low-density collection along the surface of the adjacent ileo psoas muscle measuring approximately 1.4 x 5.8 cm likely an adjacent abscess. Vascular/Lymphatic: Few small periaortic lymph nodes as well as small lymph nodes in the right lower quadrant as described. Vascular structures are unremarkable. Reproductive: Normal. Other: Moderate fluid collection over the pelvis with thin rim enhancement which may represent evolving abscess. This measures approximately 5.4 x 7.4 cm. Multiple radiodensities over the left pelvis of uncertain clinical significance. Musculoskeletal: Degenerative change of the spine. IMPRESSION: 1. Complex infectious/inflammatory process in the right lower quadrant. Significant soft tissue thickening over the ileocecal valve and cecum which may represent an infectious or inflammatory process and less likely neoplasm. This appears to cause a degree of obstruction as there are several dilated  small bowel loops as well as several adjacent secondarily thick-walled small bowel loops. Appendix is inflamed measuring 1.3 cm in size likely superimposed acute appendicitis. There is an adjacent rim enhancing fluid collection within the right iliopsoas muscle measuring 1.4 x 5.8 cm likely an abscess. Thin rim enhancing fluid collection in the pelvis measuring approximately 5.4 x 7.4 cm also likely evolving abscess. No evidence of perforation. I waited for 25 minutes for provider to call back without success, therefore the PRA will therefore call these results. I also left my cell phone with PRA if provider has questions. These results will be called to the ordering clinician or representative by the Radiologist Assistant, and communication documented in the PACS or zVision Dashboard. Electronically Signed   By: Marin Olp M.D.   On: 01/21/2019 16:58   US Renal  Result Date: 02/05/2019 CLINICAL DATA:  Acute kidney failure, unspecified. EXAM: RENAL / URINARY TRACT ULTRASOUND COMPLETE COMPARISON:  CT abdomen/pelvis 01/21/2019 FINDINGS: Right Kidney: Renal measurements: 14.2 x 5.6 x 6.6 cm = volume: 279.9 mL . Echogenicity within normal limits. No mass or hydronephrosis visualized. Left Kidney: Renal measurements: 13.4 x 6.0 x 5.4 cm = volume: 228.7 mL. Echogenicity within normal limits. No mass or hydronephrosis visualized. Bladder: The bladder is not visualized. Other: Probable left pleural effusion. IMPRESSION: No hydronephrosis.  Renal cortical echogenicity is unremarkable. The bladder is not visualized. Probable left pleural effusion Electronically Signed   By: Kellie Simmering DO   On: 02/05/2019 13:37    Assessment and plan- Patient is a 38 y.o. 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  2  out of 45 lymph nodes were positive for malignancy.  Margins were negative for malignancy.  Patient was also noted to have a second malignancy in the distal terminal ileum which was a well-differentiated neuroendocrine tumor PT1N0.  At this time I will check a baseline CEA as well as get a CT chest to complete his staging work-up.  CT abdomen did not show any evidence of distant metastatic disease.  If there is no evidence of metastatic disease given the patient has stage III disease he would benefit from adjuvant chemotherapy.  I would recommend 12 cycles of FOLFOX given every 2 weeks IV.  Discussed risks and benefits of FOLFOX chemotherapy including all but not limited to fatigue, nausea, vomiting, low blood counts, risk of infections and hospitalization.  Risk of peripheral neuropathy associated with oxaliplatin.  Treatment will be given with a curative intent pending results of CT chest.  Patient understands and agrees to proceed as planned.  Adjuvant FOLFOX chemotherapy has been shown to improve progression free survival and overall survival in [facility]IC trial.   Patient was found to have significant inflammation as well as abscess in the area of his terminal ileum and surrounding region.  He still has bilateral JP drains in place.  At this time I will allow time to heal from surgery and tentatively see him back in 4 weeks time to start chemotherapy.  Patient will need port placement as well as chemotherapy teach.  Patient's baseline labs are suggestive of iron deficiency.  He also has likely Evidence of anemia of chronic disease.  B12 levels are normal at 384.  Iron studies do show a low iron saturation of 9% and a low serum iron of 22.  I therefore recommend 2 doses of Feraheme 500 mg weekly.  Discussed risks and benefits of benefit including all but not limited to headache, leg  swelling and possible risk of infusion reaction.  Patient understands and agrees to proceed as planned  I will tentatively see him back in 4 weeks time to start his chemotherapy on that day  Given his young age at diagnosis as well as family history significant for his second cousin and paternal grandmother with colon cancer I will refer him for genetic counseling as well.  Of note surgical pathology did show MSI stability which was not consistent with Cosner syndrome  Cancer Staging Overlapping malignant neoplasm of colon [facility]) Staging form: Colon and Rectum, AJCC 8th Edition - Pathologic stage from 02/09/2019: Stage IIIB (pT4a, pN1b, cM0) - Signed by Sindy Guadeloupe, MD on 02/10/2019    Thank you for this kind referral and the opportunity to participate in the care of this patient   Visit Diagnosis 1. 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[place])   2. Iron deficiency anemia, unspecified iron deficiency anemia type   3. Goals of care, counseling/discussion     Dr. Randa Evens, MD, MPH [facility] [phone] 02/10/2019 2:58 PM

## 2019-02-11 ENCOUNTER — Telehealth: Payer: Self-pay

## 2019-02-11 ENCOUNTER — Other Ambulatory Visit: Payer: Self-pay | Admitting: *Deleted

## 2019-02-11 ENCOUNTER — Encounter: Payer: Self-pay | Admitting: Pharmacy Technician

## 2019-02-11 MED ORDER — FERUMOXYTOL INJECTION 510 MG/17 ML: 510.0000 mg | Freq: Once | INTRAVENOUS | 1 refills | Status: DC

## 2019-02-11 NOTE — Telephone Encounter (Signed)
Spoke with patient and let him know we would discuss port a cath placement tomorrow. He wanted to know if he could change his wound dressing twice daily and was told yes.

## 2019-02-11 NOTE — Progress Notes (Signed)
Patient has been approved for drug assistance by [employer] for Feraheme. The enrollment period is from 02/11/19-02/10/20 based on self pay. First DOS covered is 02/18/19.

## 2019-02-12 ENCOUNTER — Other Ambulatory Visit: Payer: Self-pay

## 2019-02-12 ENCOUNTER — Other Ambulatory Visit: Payer: PRIVATE HEALTH INSURANCE

## 2019-02-12 ENCOUNTER — Encounter: Payer: Self-pay | Admitting: Surgery

## 2019-02-12 ENCOUNTER — Ambulatory Visit (INDEPENDENT_AMBULATORY_CARE_PROVIDER_SITE_OTHER): Payer: PRIVATE HEALTH INSURANCE | Admitting: Surgery

## 2019-02-12 VITALS — BP 138/90 | HR 99 | Temp 98.1°F | Ht 73.0 in | Wt 181.0 lb

## 2019-02-12 DIAGNOSIS — K6389 Other specified diseases of intestine: Secondary | ICD-10-CM

## 2019-02-12 MED ORDER — HYDROCODONE-ACETAMINOPHEN 10-325 MG PO TABS: 1.0000 | ORAL_TABLET | Freq: Four times a day (QID) | ORAL | 0 refills | Status: DC | PRN

## 2019-02-12 NOTE — Progress Notes (Signed)
Tumor Board Documentation  Tristan [name] was presented by Dr Janese Banks at our Tumor Board on 02/12/2019, which included representatives from medical oncology, radiation oncology, navigation, pathology, radiology, surgical, internal medicine, genetics, pulmonology, palliative care, research.  Tristan [name] currently presents as a new patient, for [place], for new positive pathology with history of the following treatments: active survellience, surgical intervention(s).  Additionally, we reviewed previous medical and familial history, history of present illness, and recent lab results along with all available histopathologic and imaging studies. The tumor board considered available treatment options and made the following recommendations: Adjuvant chemotherapy Genetic testing PossibleRadiation Therapy  The following procedures/referrals were also placed: No orders of the defined types were placed in this encounter.   Clinical Trial Status: not discussed   Staging used: Pathologic Stage  AJCC Staging: T: 4a N: 1b M: x Group: Stage 3 B Adenocarcinoma of Colon   National site-specific guidelines NCCN were discussed with respect to the case.  Tumor board is a meeting of clinicians from various specialty areas who evaluate and discuss patients for whom a multidisciplinary approach is being considered. Final determinations in the plan of care are those of the provider(s). The responsibility for follow up of recommendations given during tumor board is that of the provider.   Today's extended care, comprehensive team conference, Tristan [name] was not present for the discussion and was not examined.   Multidisciplinary Tumor Board is a multidisciplinary case peer review process.  Decisions discussed in the Multidisciplinary Tumor Board reflect the opinions of the specialists present at the conference without having examined the patient.  Ultimately, treatment and diagnostic decisions rest with the primary provider(s)  and the patient.

## 2019-02-12 NOTE — Progress Notes (Signed)
Surgical Clinic Progress/Follow-up Note   HPI:  38 y.o. Male presents to clinic for post-op follow-up s/p extended right colectomy with abscess drainage ( 01/30/2019). Patient reports some lingering wound and drain site pain and has been tolerating regular diet with +flatus and normal BM's, denies N/V, fever/chills, CP, or SOB. Concerned about a gray-colored exudate from his incision.  Bilateral ankle edema, which has been improving with elevation and with his diet and exercise.  Review of Systems:  Constitutional: denies fever/chills   Respiratory: denies shortness of breath, wheezing  Cardiovascular: denies chest pain, palpitations   Skin: Denies any other rashes or skin discolorations except post-surgical wounds as per interval history  Vital Signs:  BP 138/90   Pulse 99   Temp 98.1 F (36.7 C)   Ht 6\' 1"  (1.854 m)   Wt 181 lb (82.1 kg)   SpO2 99%   BMI 23.88 kg/m    Physical Exam:  Constitutional:  -- Normal body habitus  -- Awake, alert, and oriented x3  Pulmonary:  -- No crackles -- Equal breath sounds bilaterally -- Breathing non-labored at rest Cardiovascular:  -- S1, S2 present  -- No pericardial rubs  Gastrointestinal:  -- Soft and non-distended, non-tender/with expected peri-incisional tenderness to palpation, no guarding/rebound tenderness.  -- Post-surgical incision midline wound is open, there is some ridging in the epigastrium leaving a tract deep to some of the healing subcu.  There is a smaller sinus tract at the cephalad apex.  All these were probed, there is some evidence compromised fascial tissues without having evidence of dehiscence or impending evisceration.  These areas were packed with packing strip and overall wet to moist dressing.  All adjacent skin is without erythema, there is no purulent drainage, no frank necrosis.  -- Extremities: B/L UE and LE FROM, hands and feet warm, mild bilateral ankle edema   Laboratory studies:  CBC:  Lab  Results  Component Value Date   WBC 8.7 02/08/2019   RBC 3.23 (L) 02/08/2019    Imaging: No new pertinent imaging available for review   Assessment:  38 y.o. yo Male with a problem list including...  Patient Active Problem List   Diagnosis Date Noted  . Goals of care, counseling/discussion 02/10/2019  . 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[place]) 02/10/2019  . Iron deficiency anemia 02/10/2019  . Protein-calorie malnutrition, severe 02/01/2019  . Mass of colon 01/30/2019  . Genital warts 07/15/2015      Plan:              -Remove drains today.                - okay to shower between dressing changes, to replace packing strip to sinus tracts on daily basis.             - gradually resume increased activity.             -              - return to clinic in 1 week, or as needed, instructed to call office if any questions or concerns    They are all in agreement to defer consideration of any Port-A-Cath placement at this time.  All of the above recommendations were discussed with the patient and patient's family, and all of patient's and family's questions were answered to his/her/their expressed satisfaction.  Ronny Bacon, MD, FACS [place]: [place] for exceptional care. Office: [phone]

## 2019-02-12 NOTE — Patient Instructions (Signed)
Wet to dry dressings once daily. Shower first and let the warm water run over the wound.   Call if any increase in pain or signs of infection.   Follow up in 1 week.

## 2019-02-16 NOTE — Progress Notes (Signed)
[employer] Sheet and demographic sheet faxed to [employer].

## 2019-02-16 NOTE — Patient Instructions (Signed)
Oxaliplatin Injection What is this medicine? OXALIPLATIN (ox AL i PLA tin) is a chemotherapy drug. It targets fast dividing cells, like cancer cells, and causes these cells to die. This medicine is used to treat cancers of the colon and rectum, and many other cancers. This medicine may be used for other purposes; ask your health care provider or pharmacist if you have questions. COMMON BRAND NAME(S): Eloxatin What should I tell my health care provider before I take this medicine? They need to know if you have any of these conditions:  kidney disease  an unusual or allergic reaction to oxaliplatin, other chemotherapy, other medicines, foods, dyes, or preservatives  pregnant or trying to get pregnant  breast-feeding How should I use this medicine? This drug is given as an infusion into a vein. It is administered in a hospital or clinic by a specially trained health care professional. Talk to your pediatrician regarding the use of this medicine in children. Special care may be needed. Overdosage: If you think you have taken too much of this medicine contact a poison control center or emergency room at once. NOTE: This medicine is only for you. Do not share this medicine with others. What if I miss a dose? It is important not to miss a dose. Call your doctor or health care professional if you are unable to keep an appointment. What may interact with this medicine?  medicines to increase blood counts like filgrastim, pegfilgrastim, sargramostim  probenecid  some antibiotics like amikacin, gentamicin, neomycin, polymyxin B, streptomycin, tobramycin  zalcitabine Talk to your doctor or health care professional before taking any of these medicines:  acetaminophen  aspirin  ibuprofen  ketoprofen  naproxen This list may not describe all possible interactions. Give your health care provider a list of all the medicines, herbs, non-prescription drugs, or dietary supplements you use. Also  tell them if you smoke, drink alcohol, or use illegal drugs. Some items may interact with your medicine. What should I watch for while using this medicine? Your condition will be monitored carefully while you are receiving this medicine. You will need important blood work done while you are taking this medicine. This medicine can make you more sensitive to cold. Do not drink cold drinks or use ice. Cover exposed skin before coming in contact with cold temperatures or cold objects. When out in cold weather wear warm clothing and cover your mouth and nose to warm the air that goes into your lungs. Tell your doctor if you get sensitive to the cold. This drug may make you feel generally unwell. This is not uncommon, as chemotherapy can affect healthy cells as well as cancer cells. Report any side effects. Continue your course of treatment even though you feel ill unless your doctor tells you to stop. In some cases, you may be given additional medicines to help with side effects. Follow all directions for their use. Call your doctor or health care professional for advice if you get a fever, chills or sore throat, or other symptoms of a cold or flu. Do not treat yourself. This drug decreases your body's ability to fight infections. Try to avoid being around people who are sick. This medicine may increase your risk to bruise or bleed. Call your doctor or health care professional if you notice any unusual bleeding. Be careful brushing and flossing your teeth or using a toothpick because you may get an infection or bleed more easily. If you have any dental work done, tell your dentist you   are receiving this medicine. Avoid taking products that contain aspirin, acetaminophen, ibuprofen, naproxen, or ketoprofen unless instructed by your doctor. These medicines may hide a fever. Do not become pregnant while taking this medicine. Women should inform their doctor if they wish to become pregnant or think they might be  pregnant. There is a potential for serious side effects to an unborn child. Talk to your health care professional or pharmacist for more information. Do not breast-feed an infant while taking this medicine. Call your doctor or health care professional if you get diarrhea. Do not treat yourself. What side effects may I notice from receiving this medicine? Side effects that you should report to your doctor or health care professional as soon as possible:  allergic reactions like skin rash, itching or hives, swelling of the face, lips, or tongue  low blood counts - This drug may decrease the number of white blood cells, red blood cells and platelets. You may be at increased risk for infections and bleeding.  signs of infection - fever or chills, cough, sore throat, pain or difficulty passing urine  signs of decreased platelets or bleeding - bruising, pinpoint red spots on the skin, black, tarry stools, nosebleeds  signs of decreased red blood cells - unusually weak or tired, fainting spells, lightheadedness  breathing problems  chest pain, pressure  cough  diarrhea  jaw tightness  mouth sores  nausea and vomiting  pain, swelling, redness or irritation at the injection site  pain, tingling, numbness in the hands or feet  problems with balance, talking, walking  redness, blistering, peeling or loosening of the skin, including inside the mouth  trouble passing urine or change in the amount of urine Side effects that usually do not require medical attention (report to your doctor or health care professional if they continue or are bothersome):  changes in vision  constipation  hair loss  loss of appetite  metallic taste in the mouth or changes in taste  stomach pain This list may not describe all possible side effects. Call your doctor for medical advice about side effects. You may report side effects to FDA at 1-800-FDA-1088. Where should I keep my medicine? This drug  is given in a hospital or clinic and will not be stored at home. NOTE: This sheet is a summary. It may not cover all possible information. If you have questions about this medicine, talk to your doctor, pharmacist, or health care provider.  2020 [employer] Standard (2007-09-23 17:22:47) Fluorouracil, 5-FU injection What is this medicine? FLUOROURACIL, 5-FU (flure oh YOOR a sil) is a chemotherapy drug. It slows the growth of cancer cells. This medicine is used to treat many types of cancer like breast cancer, colon or rectal cancer, pancreatic cancer, and stomach cancer. This medicine may be used for other purposes; ask your health care provider or pharmacist if you have questions. COMMON BRAND NAME(S): Adrucil What should I tell my health care provider before I take this medicine? They need to know if you have any of these conditions:  blood disorders  dihydropyrimidine dehydrogenase (DPD) deficiency  infection (especially a virus infection such as chickenpox, cold sores, or herpes)  kidney disease  liver disease  malnourished, poor nutrition  recent or ongoing radiation therapy  an unusual or allergic reaction to fluorouracil, other chemotherapy, other medicines, foods, dyes, or preservatives  pregnant or trying to get pregnant  breast-feeding How should I use this medicine? This drug is given as an infusion or injection into a vein.   It is administered in a hospital or clinic by a specially trained health care professional. Talk to your pediatrician regarding the use of this medicine in children. Special care may be needed. Overdosage: If you think you have taken too much of this medicine contact a poison control center or emergency room at once. NOTE: This medicine is only for you. Do not share this medicine with others. What if I miss a dose? It is important not to miss your dose. Call your doctor or health care professional if you are unable to keep an appointment. What  may interact with this medicine?  allopurinol  cimetidine  dapsone  digoxin  hydroxyurea  leucovorin  levamisole  medicines for seizures like ethotoin, fosphenytoin, phenytoin  medicines to increase blood counts like filgrastim, pegfilgrastim, sargramostim  medicines that treat or prevent blood clots like warfarin, enoxaparin, and dalteparin  methotrexate  metronidazole  pyrimethamine  some other chemotherapy drugs like busulfan, cisplatin, estramustine, vinblastine  trimethoprim  trimetrexate  vaccines Talk to your doctor or health care professional before taking any of these medicines:  acetaminophen  aspirin  ibuprofen  ketoprofen  naproxen This list may not describe all possible interactions. Give your health care provider a list of all the medicines, herbs, non-prescription drugs, or dietary supplements you use. Also tell them if you smoke, drink alcohol, or use illegal drugs. Some items may interact with your medicine. What should I watch for while using this medicine? Visit your doctor for checks on your progress. This drug may make you feel generally unwell. This is not uncommon, as chemotherapy can affect healthy cells as well as cancer cells. Report any side effects. Continue your course of treatment even though you feel ill unless your doctor tells you to stop. In some cases, you may be given additional medicines to help with side effects. Follow all directions for their use. Call your doctor or health care professional for advice if you get a fever, chills or sore throat, or other symptoms of a cold or flu. Do not treat yourself. This drug decreases your body's ability to fight infections. Try to avoid being around people who are sick. This medicine may increase your risk to bruise or bleed. Call your doctor or health care professional if you notice any unusual bleeding. Be careful brushing and flossing your teeth or using a toothpick because you may  get an infection or bleed more easily. If you have any dental work done, tell your dentist you are receiving this medicine. Avoid taking products that contain aspirin, acetaminophen, ibuprofen, naproxen, or ketoprofen unless instructed by your doctor. These medicines may hide a fever. Do not become pregnant while taking this medicine. Women should inform their doctor if they wish to become pregnant or think they might be pregnant. There is a potential for serious side effects to an unborn child. Talk to your health care professional or pharmacist for more information. Do not breast-feed an infant while taking this medicine. Men should inform their doctor if they wish to father a child. This medicine may lower sperm counts. Do not treat diarrhea with over the counter products. Contact your doctor if you have diarrhea that lasts more than 2 days or if it is severe and watery. This medicine can make you more sensitive to the sun. Keep out of the sun. If you cannot avoid being in the sun, wear protective clothing and use sunscreen. Do not use sun lamps or tanning beds/booths. What side effects may I notice from   receiving this medicine? Side effects that you should report to your doctor or health care professional as soon as possible:  allergic reactions like skin rash, itching or hives, swelling of the face, lips, or tongue  low blood counts - this medicine may decrease the number of white blood cells, red blood cells and platelets. You may be at increased risk for infections and bleeding.  signs of infection - fever or chills, cough, sore throat, pain or difficulty passing urine  signs of decreased platelets or bleeding - bruising, pinpoint red spots on the skin, black, tarry stools, blood in the urine  signs of decreased red blood cells - unusually weak or tired, fainting spells, lightheadedness  breathing problems  changes in vision  chest pain  mouth sores  nausea and vomiting  pain,  swelling, redness at site where injected  pain, tingling, numbness in the hands or feet  redness, swelling, or sores on hands or feet  stomach pain  unusual bleeding Side effects that usually do not require medical attention (report to your doctor or health care professional if they continue or are bothersome):  changes in finger or toe nails  diarrhea  dry or itchy skin  hair loss  headache  loss of appetite  sensitivity of eyes to the light  stomach upset  unusually teary eyes This list may not describe all possible side effects. Call your doctor for medical advice about side effects. You may report side effects to FDA at 1-800-FDA-1088. Where should I keep my medicine? This drug is given in a hospital or clinic and will not be stored at home. NOTE: This sheet is a summary. It may not cover all possible information. If you have questions about this medicine, talk to your doctor, pharmacist, or health care provider.  2020 [employer] Standard (2007-07-02 13:53:16) Leucovorin injection What is this medicine? LEUCOVORIN (loo koe VOR in) is used to prevent or treat the harmful effects of some medicines. This medicine is used to treat anemia caused by a low amount of folic acid in the body. It is also used with 5-fluorouracil (5-FU) to treat colon cancer. This medicine may be used for other purposes; ask your health care provider or pharmacist if you have questions. What should I tell my health care provider before I take this medicine? They need to know if you have any of these conditions:  anemia from low levels of vitamin B-12 in the blood  an unusual or allergic reaction to leucovorin, folic acid, other medicines, foods, dyes, or preservatives  pregnant or trying to get pregnant  breast-feeding How should I use this medicine? This medicine is for injection into a muscle or into a vein. It is given by a health care professional in a hospital or clinic setting. Talk  to your pediatrician regarding the use of this medicine in children. Special care may be needed. Overdosage: If you think you have taken too much of this medicine contact a poison control center or emergency room at once. NOTE: This medicine is only for you. Do not share this medicine with others. What if I miss a dose? This does not apply. What may interact with this medicine?  capecitabine  fluorouracil  phenobarbital  phenytoin  primidone  trimethoprim-sulfamethoxazole This list may not describe all possible interactions. Give your health care provider a list of all the medicines, herbs, non-prescription drugs, or dietary supplements you use. Also tell them if you smoke, drink alcohol, or use illegal drugs. Some items may   interact with your medicine. What should I watch for while using this medicine? Your condition will be monitored carefully while you are receiving this medicine. This medicine may increase the side effects of 5-fluorouracil, 5-FU. Tell your doctor or health care professional if you have diarrhea or mouth sores that do not get better or that get worse. What side effects may I notice from receiving this medicine? Side effects that you should report to your doctor or health care professional as soon as possible:  allergic reactions like skin rash, itching or hives, swelling of the face, lips, or tongue  breathing problems  fever, infection  mouth sores  unusual bleeding or bruising  unusually weak or tired Side effects that usually do not require medical attention (report to your doctor or health care professional if they continue or are bothersome):  constipation or diarrhea  loss of appetite  nausea, vomiting This list may not describe all possible side effects. Call your doctor for medical advice about side effects. You may report side effects to FDA at 1-800-FDA-1088. Where should I keep my medicine? This drug is given in a hospital or clinic and  will not be stored at home. NOTE: This sheet is a summary. It may not cover all possible information. If you have questions about this medicine, talk to your doctor, pharmacist, or health care provider.  2020 [employer] Standard (2007-09-02 16:50:29)  

## 2019-02-17 ENCOUNTER — Inpatient Hospital Stay (HOSPITAL_BASED_OUTPATIENT_CLINIC_OR_DEPARTMENT_OTHER): Payer: PRIVATE HEALTH INSURANCE | Admitting: Oncology

## 2019-02-17 ENCOUNTER — Inpatient Hospital Stay: Payer: PRIVATE HEALTH INSURANCE | Attending: Oncology

## 2019-02-17 ENCOUNTER — Other Ambulatory Visit: Payer: Self-pay

## 2019-02-17 DIAGNOSIS — J45909 Unspecified asthma, uncomplicated: Secondary | ICD-10-CM | POA: Insufficient documentation

## 2019-02-17 DIAGNOSIS — Z5111 Encounter for antineoplastic chemotherapy: Secondary | ICD-10-CM | POA: Diagnosis not present

## 2019-02-17 DIAGNOSIS — K219 Gastro-esophageal reflux disease without esophagitis: Secondary | ICD-10-CM | POA: Diagnosis not present

## 2019-02-17 DIAGNOSIS — D509 Iron deficiency anemia, unspecified: Secondary | ICD-10-CM | POA: Diagnosis not present

## 2019-02-17 DIAGNOSIS — Z79899 Other long term (current) drug therapy: Secondary | ICD-10-CM | POA: Diagnosis not present

## 2019-02-17 DIAGNOSIS — C189 Malignant neoplasm of colon, unspecified: Secondary | ICD-10-CM

## 2019-02-17 DIAGNOSIS — C188 Malignant neoplasm of overlapping sites of colon: Secondary | ICD-10-CM | POA: Diagnosis not present

## 2019-02-17 DIAGNOSIS — F17210 Nicotine dependence, cigarettes, uncomplicated: Secondary | ICD-10-CM | POA: Insufficient documentation

## 2019-02-17 DIAGNOSIS — Z9049 Acquired absence of other specified parts of digestive tract: Secondary | ICD-10-CM | POA: Insufficient documentation

## 2019-02-17 NOTE — Progress Notes (Signed)
[place]  [phone] [phone]  Patient Care Team: Patient, No Pcp Per as PCP - General (General Practice) Clent Jacks, RN as Oncology Nurse Navigator   Name of the patient: Tr[name]  [id number]  Dec 28, 1980   Date of visit: 02/17/19  Diagnosis- Colon cancer  Chief complaint/Reason for visit- Initial Meeting for [name], preparing for starting chemotherapy  Heme/Onc history:  Oncology History  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[place])  02/09/2019 Cancer Staging   Staging form: Colon and Rectum, AJCC 8th Edition - Pathologic stage from 02/09/2019: Stage IIIB (pT4a, pN1b, cM0) - Signed by Sindy Guadeloupe, MD on 02/10/2019   02/10/2019 Initial Diagnosis   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[place])     Interval history-Tristan [name] is a 38 year old male who presents to chemo care clinic today for initial meeting in preparation for starting chemotherapy. I introduced the chemo care clinic and we discussed that the role of the clinic is to assist those who are at an increased risk of emergency room visits and/or complications during the course of chemotherapy treatment. We discussed that the increased risk takes into account factors such as age, performance status, and co-morbidities. We also discussed that for some, this might include barriers to care such as not having a primary care provider, lack of insurance/transportation, or not being able to afford medications. We discussed that the goal of the program is to help prevent unplanned ER visits and help reduce complications during chemotherapy. We do this by discussing specific risk factors to each individual and identifying ways that we can help improve these risk factors and reduce barriers to care.   ECOG FS:1 - Symptomatic but completely ambulatory  Review of systems- Review of Systems  Constitutional: Negative.  Negative for  chills, fever, malaise/fatigue and weight loss.  HENT: Negative for congestion, ear pain and tinnitus.   Eyes: Negative.  Negative for blurred vision and double vision.  Respiratory: Negative.  Negative for cough, sputum production and shortness of breath.   Cardiovascular: Negative.  Negative for chest pain, palpitations and leg swelling.  Gastrointestinal: Positive for abdominal pain (recent abdominal surgery ). Negative for constipation, diarrhea, nausea and vomiting.  Genitourinary: Negative for dysuria, frequency and urgency.  Musculoskeletal: Negative for back pain and falls.  Skin: Negative.  Negative for rash.  Neurological: Negative.  Negative for weakness and headaches.  Endo/Heme/Allergies: Negative.  Does not bruise/bleed easily.  Psychiatric/Behavioral: Negative.  Negative for depression. The patient is not nervous/anxious and does not have insomnia.     Current treatment- beginning FOLFOX when abdominal wound is healed  No Known Allergies  Past Medical History:  Diagnosis Date   Abdominal pain    Asthma    GERD (gastroesophageal reflux disease)    Nausea    Poor appetite    Weight loss     Past Surgical History:  Procedure Laterality Date   COLONOSCOPY N/A 01/30/2019   Procedure: COLONOSCOPY;  Surgeon: Jules Husbands, MD;  Location: [facility] ORS;  Service: General;  Laterality: N/A;   COLONOSCOPY WITH PROPOFOL N/A 08/16/2016   Procedure: COLONOSCOPY WITH PROPOFOL;  Surgeon: [name], MD;  Location: [facility] ENDOSCOPY;  Service: Endoscopy;  Laterality: N/A;   COLONOSCOPY WITH PROPOFOL N/A 01/30/2019   Procedure: COLONOSCOPY WITH PROPOFOL;  Surgeon: [name], MD;  Location: [facility] ENDOSCOPY;  Service: Gastroenterology;  Laterality: N/A;  TRAVEL CASE TO O.R. - PROCEDURE TO START AT 7:30 AM IN O.R.  LAPAROSCOPIC RIGHT COLECTOMY N/A 01/30/2019   Procedure: LAPAROSCOPIC HAND ASSISTED RIGHT COLECTOMY CONVERTED TO OPEN PROCEDURE;  Surgeon: Jules Husbands, MD;  Location:  [facility] ORS;  Service: General;  Laterality: N/A;   NO PAST SURGERIES      Social History   Socioeconomic History   Marital status: Single    Spouse name: Not on file   Number of children: Not on file   Years of education: Not on file   Highest education level: Not on file  Occupational History   Not on file  Social Needs   Financial resource strain: Not on file   Food insecurity    Worry: Not on file    Inability: Not on file   Transportation needs    Medical: Not on file    Non-medical: Not on file  Tobacco Use   Smoking status: Current Every Day Smoker    Packs/day: 0.50    Types: Cigarettes   Smokeless tobacco: Never Used  Substance and Sexual Activity   Alcohol use: Not Currently   Drug use: Yes    Types: Marijuana    Comment: last week   Sexual activity: Yes    Birth control/protection: Condom  Lifestyle   Physical activity    Days per week: Not on file    Minutes per session: Not on file   Stress: Not on file  Relationships   Social connections    Talks on phone: Not on file    Gets together: Not on file    Attends religious service: Not on file    Active member of club or organization: Not on file    Attends meetings of clubs or organizations: Not on file    Relationship status: Not on file   Intimate partner violence    Fear of current or ex partner: Not on file    Emotionally abused: Not on file    Physically abused: Not on file    Forced sexual activity: Not on file  Other Topics Concern   Not on file  Social History Narrative   Not on file    Family History  Problem Relation Age of Onset   Diabetes Mother 47       Type 1   Healthy Father    Colon polyps Father    Colon cancer Other 46   Breast cancer Other 2   Ulcerative colitis Paternal Aunt      Current Outpatient Medications:    acetaminophen (TYLENOL) 325 MG tablet, Take 2 tablets (650 mg total) by mouth every 8 (eight) hours as needed for mild pain.,  Disp: 40 tablet, Rfl: 0   calcium carbonate (TUMS - DOSED IN MG ELEMENTAL CALCIUM) 500 MG chewable tablet, Chew 2 tablets by mouth 2 (two) times daily as needed for indigestion or heartburn., Disp: , Rfl:    docusate sodium (COLACE) 100 MG capsule, Take 1 capsule (100 mg total) by mouth 2 (two) times daily as needed for up to 10 days for mild constipation., Disp: 20 capsule, Rfl: 0   ferumoxytol (FERAHEME) 510 MG/17ML SOLN injection, Inject 17 mLs (510 mg total) into the vein once for 1 dose. Infuse '510mg'$  Feraheme IV over at least 15 minutes at day 0 and repeat 3 to 8 days later. Dilute full contents of vial (53m) per product insert instructions before use. 2 vials(321m, Disp: 17 mL, Rfl: 1   gabapentin (NEURONTIN) 300 MG capsule, Take 1 capsule (300 mg total) by mouth 3 (three) times daily.,  Disp: 30 capsule, Rfl: 1   HYDROcodone-acetaminophen (NORCO) 10-325 MG tablet, Take 1 tablet by mouth every 6 (six) hours as needed., Disp: 20 tablet, Rfl: 0   HYDROcodone-acetaminophen (NORCO) 5-325 MG tablet, Take 1 tablet by mouth every 6 (six) hours as needed for moderate pain or severe pain (For dressing changes)., Disp: 25 tablet, Rfl: 0  Physical exam: There were no vitals filed for this visit. Physical Exam Constitutional:      Appearance: Normal appearance.  HENT:     Head: Normocephalic and atraumatic.  Eyes:     Pupils: Pupils are equal, round, and reactive to light.  Neck:     Musculoskeletal: Normal range of motion.  Cardiovascular:     Rate and Rhythm: Normal rate and regular rhythm.     Heart sounds: Normal heart sounds. No murmur.  Pulmonary:     Effort: Pulmonary effort is normal.     Breath sounds: Normal breath sounds. No wheezing.  Abdominal:     General: Bowel sounds are normal. There is no distension.     Palpations: Abdomen is soft.     Tenderness: There is abdominal tenderness.  Musculoskeletal: Normal range of motion.  Skin:    General: Skin is warm and dry.      Findings: Wound present. No rash.  Neurological:     Mental Status: He is alert and oriented to person, place, and time.  Psychiatric:        Judgment: Judgment normal.      CMP Latest Ref Rng & Units 02/08/2019  Glucose 70 - 99 mg/dL 105(H)  BUN 6 - 20 mg/dL 7  Creatinine 0.61 - 1.24 mg/dL 1.86(H)  Sodium 135 - 145 mmol/L 138  Potassium 3.5 - 5.1 mmol/L 3.4(L)  Chloride 98 - 111 mmol/L 101  CO2 22 - 32 mmol/L 26  Calcium 8.9 - 10.3 mg/dL 8.5(L)  Total Protein 6.5 - 8.1 g/dL -  Total Bilirubin 0.3 - 1.2 mg/dL -  Alkaline Phos 38 - 126 U/L -  AST 15 - 41 U/L -  ALT 0 - 44 U/L -   CBC Latest Ref Rng & Units 02/08/2019  WBC 4.0 - 10.5 K/uL 8.7  Hemoglobin 13.0 - 17.0 g/dL 7.8(L)  Hematocrit 39.0 - 52.0 % 23.6(L)  Platelets 150 - 400 K/uL 295    No images are attached to the encounter.  Ct Abdomen Pelvis W Contrast  Result Date: 01/21/2019 CLINICAL DATA:  Acute abdominal pain over the lower abdomen 4-5 months. EXAM: CT ABDOMEN AND PELVIS WITH CONTRAST TECHNIQUE: Multidetector CT imaging of the abdomen and pelvis was performed using the standard protocol following bolus administration of intravenous contrast. CONTRAST:  131m OMNIPAQUE IOHEXOL 300 MG/ML  SOLN COMPARISON:  11/01/2018 and 07/02/2016 FINDINGS: Lower chest: Lung bases are clear. Hepatobiliary: Liver, gallbladder and biliary tree are normal. Gallbladder somewhat contracted. Pancreas: Normal. Spleen: Normal. Adrenals/Urinary Tract: Adrenal glands are normal. Kidneys normal size without hydronephrosis or nephrolithiasis. Ureters and bladder are normal. Stomach/Bowel: Stomach is normal. There are dilated proximal and distal small bowel loops. There are several small bowel loops in the right lower quadrant with wall thickening likely secondary to an adjacent inflammatory process in the right lower quadrant. Appendix is inflamed measuring 1.3 cm in diameter with mucosal enhancement. Colon is decompressed. Again noted is masslike  soft tissue density over the the region of the ileocecal valve and cecum as this may be causing a degree of obstruction. No evidence of perforation. There is moderate adjacent inflammatory  change in the mesenteric fat over the right lower quadrant. Small amount of adjacent free fluid. These are complex findings in the right lower quadrant as there appears to be infectious component. There appears to be a process involving the ileocecal region which may be infectious or inflammatory nature and less likely neoplastic. There may be secondary involvement of the appendix with acute appendicitis. There is an adjacent rim enhancing low-density collection along the surface of the adjacent ileo psoas muscle measuring approximately 1.4 x 5.8 cm likely an adjacent abscess. Vascular/Lymphatic: Few small periaortic lymph nodes as well as small lymph nodes in the right lower quadrant as described. Vascular structures are unremarkable. Reproductive: Normal. Other: Moderate fluid collection over the pelvis with thin rim enhancement which may represent evolving abscess. This measures approximately 5.4 x 7.4 cm. Multiple radiodensities over the left pelvis of uncertain clinical significance. Musculoskeletal: Degenerative change of the spine. IMPRESSION: 1. Complex infectious/inflammatory process in the right lower quadrant. Significant soft tissue thickening over the ileocecal valve and cecum which may represent an infectious or inflammatory process and less likely neoplasm. This appears to cause a degree of obstruction as there are several dilated small bowel loops as well as several adjacent secondarily thick-walled small bowel loops. Appendix is inflamed measuring 1.3 cm in size likely superimposed acute appendicitis. There is an adjacent rim enhancing fluid collection within the right iliopsoas muscle measuring 1.4 x 5.8 cm likely an abscess. Thin rim enhancing fluid collection in the pelvis measuring approximately 5.4 x 7.4 cm  also likely evolving abscess. No evidence of perforation. I waited for 25 minutes for provider to call back without success, therefore the PRA will therefore call these results. I also left my cell phone with PRA if provider has questions. These results will be called to the ordering clinician or representative by the Radiologist Assistant, and communication documented in the PACS or zVision Dashboard. Electronically Signed   By: Marin Olp M.D.   On: 01/21/2019 16:58   US Renal  Result Date: 02/05/2019 CLINICAL DATA:  Acute kidney failure, unspecified. EXAM: RENAL / URINARY TRACT ULTRASOUND COMPLETE COMPARISON:  CT abdomen/pelvis 01/21/2019 FINDINGS: Right Kidney: Renal measurements: 14.2 x 5.6 x 6.6 cm = volume: 279.9 mL . Echogenicity within normal limits. No mass or hydronephrosis visualized. Left Kidney: Renal measurements: 13.4 x 6.0 x 5.4 cm = volume: 228.7 mL. Echogenicity within normal limits. No mass or hydronephrosis visualized. Bladder: The bladder is not visualized. Other: Probable left pleural effusion. IMPRESSION: No hydronephrosis.  Renal cortical echogenicity is unremarkable. The bladder is not visualized. Probable left pleural effusion Electronically Signed   By: Kellie Simmering DO   On: 02/05/2019 13:37     Assessment and plan- Patient is a 38 y.o. male who presents to [name] for initial meeting in preparation for starting chemotherapy for the treatment of Stage III colon cancer.    1. HPI: Patient is a 38 year old male who saw Dr. Vicente Males back in July 2018 for abdominal pain.  At that time CT showed inflammation of the sigmoid colon extending into the rectum.  He was given a course of antibiotics and was asked to follow-up with GI.  A colonoscopy was attempted at that time but was very hard to get through past the sigmoid colon as it was tight and tortuous.  Rectal biopsy at that time was normal.  CT colonography and barium enema was recommended but patient was lost to  follow-up.  He then presented to the emergency room in  August 2020 with symptoms of right lower quadrant abdominal pain.  CT showed masslike thickening involving the cecum and the ascending colon the terminal ileum with surrounding inflammation changes differentials include infectious/inflammatory, enterocolitis as well as colonic neoplasm.  Since it was difficult to do a colonoscopy a barium enema was performed on 01/09/2019 which showed a large irregular apple core lesion involving the lower portion of the right colon.  Since it was difficult for the patient to undergo colonoscopy he was referred to Dr. Adora Fridge for definitive surgical management.  Patient had Hemicolectomy on 01/30/2019.  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  With invasion of visceral peritoneum with findings of peritoneal adhesions and abscess.  Metastatic carcinoma in 2 out of 245 regional lymph nodes.  Margins were negative.  Appendix encased in adhesions with adjacent abscess MSI testing showed low probability of MSI high.  2. Chemo Care Clinic/High Risk for ER/Hospitalization during chemotherapy- We discussed the role of the chemo care clinic and identified patient specific risk factors. I discussed that patient was identified as high risk primarily based on: stage of disease.  Patient has past medical history positive for: Past Medical History:  Diagnosis Date   Abdominal pain    Asthma    GERD (gastroesophageal reflux disease)    Nausea    Poor appetite    Weight loss     Patient has past surgical history positive for: Past Surgical History:  Procedure Laterality Date   COLONOSCOPY N/A 01/30/2019   Procedure: COLONOSCOPY;  Surgeon: Jules Husbands, MD;  Location: [facility] ORS;  Service: General;  Laterality: N/A;   COLONOSCOPY WITH PROPOFOL N/A 08/16/2016   Procedure: COLONOSCOPY WITH PROPOFOL;  Surgeon: [name], MD;  Location: [name]  ENDOSCOPY;  Service: Endoscopy;  Laterality: N/A;   COLONOSCOPY WITH PROPOFOL N/A 01/30/2019   Procedure: COLONOSCOPY WITH PROPOFOL;  Surgeon: [name], MD;  [name] END[facility];  Service: Gastroenterology;  Laterality: N/A;  TRAVEL CASE TO O.R. - PROCEDURE TO START AT 7:30 AM IN O.R.   LAPAROSCOPIC RIGHT COLECTOMY N/A 01/30/2019   Procedure: LAPAROSCOPIC HAND ASSISTED RIGHT COLECTOMY CONVERTED TO OPEN PROCEDURE;  Surgeon: Jules Husbands, MD;  Location: [facility] ORS;  Service: General;  Laterality: N/A;   NO PAST SURGERIES      Based on our high risk symptom management report; this patient has a high risk of ED utilization.  The percentage below indicates how "at risk "  this patient based on the factors in this table within one year.   72% Risk of Admission or ED Visit This score indicates an adult patient's 1-year risk, as a percentage, of a hospital admission or ED visit. Current PCP: No Pcp Per Patient  Hospital Admissions: 2   ED Visits: 5   Has Medicaid: No  Has Medicare: No  In relationship: No  Has Anemia: Yes  Has asthma: No  Has atrial fibrillation: No  Has CVD: No  Has chronic kidney disease: No  Has Chronic Obstructive Pulmonary Disease: No  Has Congestive Heart Failure: No  Has Connective Tissue Disorder: No  Has Depression: No  Has Diabetes: No  Has liver disease: No  Has Peripheral Vascular Disease: No    3. We discussed that social determinants of health may have significant impacts on health and outcomes for cancer patients.  Today we discussed specific social determinants of performance status, alcohol use, depression, financial needs, food insecurity, housing, interpersonal violence, social connections, stress,  tobacco use, and transportation.    After lengthy discussion the following were identified as areas of need: Nothing specific. He would like to be contacted by Elease Etienne our social worker.   We discussed options including home based and  outpatient services, DME, and CARE program. We discusssed that patients who participate in regular physical activity report fewer negative impacts of cancer and treatments and report less fatigue.  We discussed self-referral to [facility] for counseling services, psychiatry for medication management, or palliative care/symptom management as well as primary care providers.  We discussed that living with cancer can create tremendous financial burden.  We discussed options for assistance. I asked that if assistance is needed in affording medications or paying bills to please let us know so that we can provide assistance.  We discussed options for food including social services.  We will also notify [facility] to see if [name] can provide support.  Patient informed of food pantry at [name] and was provided with care package today.  Please notify nursing if un-met needs. We discussed referral to social work. Will also discuss with Elease Etienne to see if [facility] can provide support of utility bills, etc.  We discussed options for support groups at the [name]. If interested, please notify nurse navigator to enroll.  We discussed options for managing stress including healthy eating, exercise as well as participating in no charge counseling services at the [name] and support groups.  If these are of interest, patient can notify either myself or primary nursing team. We discussed options for management including medications and referral to quit Smart program We discussed options for transportation including acta, paratransit, bus routes, link transit, taxi/uber/lyft, and [name] van.  I have notified primary oncology team who will help assist with arranging Lucianne Lei transportation for appointments when needed. We also discussed options for transportation on short notice/acute visits.   5. Based on stage of cancer and/or identified needs today, I will refer patient to palliative  care for goals of care and advanced care planning.  We also discussed the role of the Symptom Management Clinic at [facility] for acute issues and methods of contacting clinic/provider. He denies needing specific assistance at this time and He will be followed by Mariea Clonts, RN (Nurse Navigator).   Visit Diagnosis 1. Malignant neoplasm of colon, unspecified part of colon [facility])    Patient expressed understanding and was in agreement with this plan. He also understands that He can call clinic at any time with any questions, concerns, or complaints.   A total of (25) minutes of face-to-face time was spent with this patient with greater than 50% of that time in counseling and care-coordination.  [facility]  CC: Dr. Janese Banks

## 2019-02-18 ENCOUNTER — Other Ambulatory Visit: Payer: Self-pay

## 2019-02-18 ENCOUNTER — Inpatient Hospital Stay: Payer: PRIVATE HEALTH INSURANCE

## 2019-02-18 VITALS — BP 120/66 | HR 89 | Temp 98.0°F | Resp 18

## 2019-02-18 DIAGNOSIS — D509 Iron deficiency anemia, unspecified: Secondary | ICD-10-CM

## 2019-02-18 DIAGNOSIS — Z5111 Encounter for antineoplastic chemotherapy: Secondary | ICD-10-CM | POA: Diagnosis not present

## 2019-02-18 MED ORDER — SODIUM CHLORIDE 0.9 % IV SOLN
510.0000 mg | Freq: Once | INTRAVENOUS | Status: AC
  Administered 2019-02-18: 510 mg via INTRAVENOUS
  Filled 2019-02-18: qty 17

## 2019-02-18 MED ORDER — SODIUM CHLORIDE 0.9 % IV SOLN
Freq: Once | INTRAVENOUS | Status: AC
  Administered 2019-02-18: 14:00:00 via INTRAVENOUS
  Filled 2019-02-18: qty 250

## 2019-02-18 NOTE — Progress Notes (Signed)
Pt tolerated Feraheme infusion well with no signs of complications or signs of reaction. Vital signs stable prior to infusion and post infusion. RN educated pt on the importance of calling the clinic if any signs of complications arise at home and if it is an emergency to call 911. Pt verbalized understanding and all questions answered at this time. VSS and pt stable for discharge.   [name] [employer]

## 2019-02-19 ENCOUNTER — Ambulatory Visit (INDEPENDENT_AMBULATORY_CARE_PROVIDER_SITE_OTHER): Payer: PRIVATE HEALTH INSURANCE | Admitting: Surgery

## 2019-02-19 ENCOUNTER — Encounter: Payer: Self-pay | Admitting: Surgery

## 2019-02-19 VITALS — BP 134/80 | HR 80 | Temp 97.5°F | Ht 73.0 in | Wt 177.4 lb

## 2019-02-19 DIAGNOSIS — K6389 Other specified diseases of intestine: Secondary | ICD-10-CM

## 2019-02-19 MED ORDER — HYDROCODONE-ACETAMINOPHEN 5-325 MG PO TABS: 2.0000 | ORAL_TABLET | Freq: Four times a day (QID) | ORAL | 0 refills | Status: DC | PRN

## 2019-02-19 NOTE — Patient Instructions (Signed)
Patient will continue the wet to moist dressing change. Patient will follow up with Dr.Pabon in two weeks.  Laparoscopic Colectomy, Care After This sheet gives you information about how to care for yourself after your procedure. Your health care provider may also give you more specific instructions. If you have problems or questions, contact your health care provider. What can I expect after the procedure? After your procedure, it is common to have the following:  Pain in your abdomen, especially in the incision areas. You will be given medicine to control the pain.  Tiredness. This is a normal part of the recovery process. Your energy level will return to normal over the next several weeks.  Changes in your bowel movements, such as constipation or needing to go more often. Talk with your health care provider about how to manage this. Follow these instructions at home: Medicines  Take over-the-counter and prescription medicines only as told by your health care provider.  Do not drive or use heavy machinery while taking prescription pain medicine.  Do not drink alcohol while taking prescription pain medicine.  If you were prescribed an antibiotic medicine, use it as told by your health care provider. Do not stop using the antibiotic even if you start to feel better. Incision care   Follow instructions from your health care provider about how to take care of your incision areas. Make sure you: ? Keep your incisions clean and dry. ? Wash your hands with soap and water before and after applying medicine to the areas, and before and after changing your bandage (dressing). If soap and water are not available, use hand sanitizer. ? Change your dressing as told by your health care provider. ? Leave stitches (sutures), skin glue, or adhesive strips in place. These skin closures may need to stay in place for 2 weeks or longer. If adhesive strip edges start to loosen and curl up, you may trim the  loose edges. Do not remove adhesive strips completely unless your health care provider tells you to do that.  Do not wear tight clothing over the incisions. Tight clothing may rub and irritate the incision areas, which may cause the incisions to open.  Do not take baths, swim, or use a hot tub until your health care provider approves. Ask your health care provider if you can take showers. You may only be allowed to take sponge baths for bathing.  Check your incision area every day for signs of infection. Check for: ? More redness, swelling, or pain. ? More fluid or blood. ? Warmth. ? Pus or a bad smell. Activity  Avoid lifting anything that is heavier than 10 lb (4.5 kg) for 2 weeks or until your health care provider says it is okay.  You may resume normal activities as told by your health care provider. Ask your health care provider what activities are safe for you.  Take rest breaks during the day as needed. Eating and drinking  Follow instructions from your health care provider about what you can eat after surgery.  To prevent or treat constipation while you are taking prescription pain medicine, your health care provider may recommend that you: ? Drink enough fluid to keep your urine clear or pale yellow. ? Take over-the-counter or prescription medicines. ? Eat foods that are high in fiber, such as fresh fruits and vegetables, whole grains, and beans. ? Limit foods that are high in fat and processed sugars, such as fried and sweet foods. General instructions  Ask  your health care provider when you will need an appointment to get your sutures or staples removed.  Keep all follow-up visits as told by your health care provider. This is important. Contact a health care provider if:  You have more redness, swelling, or pain around your incisions.  You have more fluid or blood coming from the incisions.  Your incisions feel warm to the touch.  You have pus or a bad smell coming  from your incisions or your dressing.  You have a fever.  You have an incision that breaks open (edges not staying together) after sutures or staples have been removed. Get help right away if:  You develop a rash.  You have chest pain or difficulty breathing.  You have pain or swelling in your legs.  You feel light-headed or you faint.  Your abdomen swells (becomes distended).  You have nausea or vomiting.  You have blood in your stool (feces). This information is not intended to replace advice given to you by your health care provider. Make sure you discuss any questions you have with your health care provider. Document Released: 09/15/2004 Document Revised: 11/15/2017 Document Reviewed: 11/28/2015 Elsevier Patient Education  2020 [employer].

## 2019-02-19 NOTE — Progress Notes (Signed)
Surgical Clinic Progress/Follow-up Note   HPI:  38 y.o. Male presents to clinic for second post-op follow-up. Surgery was open right colectomy with drainage of multiple abscesses. Patient reports near-complete resolution of pre-and most peri-operative pain and has been tolerating regular diet with +flatus and normal BM's, denies N/V, fever, CP, or SOB.    Vital Signs:  BP 134/80   Pulse 80   Temp (!) 97.5 F (36.4 C) (Temporal)   Ht 6\' 1"  (1.854 m)   Wt 177 lb 6.4 oz (80.5 kg)   SpO2 98%   BMI 23.41 kg/m    Physical Exam:   Gastrointestinal:  -- Soft and non-distended, non-tender/with mild peri-incisional tenderness to palpation, no guarding/rebound tenderness -- Post-surgical incision well granulated, with some residual adherent fibrinous exudate without any peri-incisional erythema or drainage, all small sinus tracts that were present on our last visit are completely gone.  No evidence of any fascial defect at present -- No abdominal masses appreciated, pulsatile or otherwise  Musculoskeletal / Integumentary:  -- Wounds or skin discoloration: None appreciated except post-surgical incisions as described above  -- Extremities: B/L UE and LE FROM, hands and feet warm, no significant edema   Laboratory studies: None noted  Imaging: No new pertinent imaging available for review    Assessment:  38 y.o. yo Male with a problem list including...  Patient Active Problem List   Diagnosis Date Noted  . Goals of care, counseling/discussion 02/10/2019  . 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[place]) 02/10/2019  . Iron deficiency anemia 02/10/2019  . Protein-calorie malnutrition, severe 02/01/2019  . Mass of colon 01/30/2019  . Genital warts 07/15/2015    presents to clinic for second post-op follow-up evaluation, doing well.   Plan:             -Continue wet to moist dressing changes to midline abdomen, patient may continue to shower.  -Rx for additional hydrocodone/Tylenol.   Patient feels this will be his last need for this type of pain medication.             - return to clinic in 2 weeks with Dr. Dahlia Byes or as needed, instructed to call office if any questions or concerns  All of the above recommendations were discussed with the patient and patient's family, and all of patient's and family's questions were answered to their expressed satisfaction.  Ronny Bacon, MD, FACS [place]. Office: [phone]

## 2019-02-23 ENCOUNTER — Other Ambulatory Visit: Payer: Self-pay

## 2019-02-23 ENCOUNTER — Other Ambulatory Visit
Admission: RE | Admit: 2019-02-23 | Discharge: 2019-02-23 | Disposition: A | Discharge status: Home / Self Care | Payer: PRIVATE HEALTH INSURANCE | Source: Ambulatory Visit | Attending: Surgery | Admitting: Surgery

## 2019-02-23 ENCOUNTER — Ambulatory Visit (INDEPENDENT_AMBULATORY_CARE_PROVIDER_SITE_OTHER): Payer: PRIVATE HEALTH INSURANCE | Admitting: Surgery

## 2019-02-23 ENCOUNTER — Other Ambulatory Visit: Payer: Self-pay | Admitting: Surgery

## 2019-02-23 ENCOUNTER — Encounter: Payer: Self-pay | Admitting: Surgery

## 2019-02-23 ENCOUNTER — Telehealth: Payer: Self-pay

## 2019-02-23 ENCOUNTER — Ambulatory Visit
Admission: RE | Admit: 2019-02-23 | Discharge: 2019-02-23 | Disposition: A | Discharge status: Home / Self Care | Payer: PRIVATE HEALTH INSURANCE | Source: Ambulatory Visit | Attending: Oncology | Admitting: Oncology

## 2019-02-23 ENCOUNTER — Telehealth: Payer: Self-pay | Admitting: *Deleted

## 2019-02-23 VITALS — BP 127/88 | HR 83 | Temp 96.3°F | Ht 73.0 in | Wt 170.6 lb

## 2019-02-23 DIAGNOSIS — K6389 Other specified diseases of intestine: Secondary | ICD-10-CM | POA: Diagnosis not present

## 2019-02-23 DIAGNOSIS — C188 Malignant neoplasm of overlapping sites of colon: Secondary | ICD-10-CM | POA: Insufficient documentation

## 2019-02-23 DIAGNOSIS — R935 Abnormal findings on diagnostic imaging of other abdominal regions, including retroperitoneum: Secondary | ICD-10-CM

## 2019-02-23 LAB — COMPREHENSIVE METABOLIC PANEL
ALT: 27 U/L (ref 0–44)
AST: 27 U/L (ref 15–41)
Albumin: 3.3 g/dL — ABNORMAL LOW (ref 3.5–5.0)
Alkaline Phosphatase: 70 U/L (ref 38–126)
Anion gap: 11 (ref 5–15)
BUN: <5 mg/dL — ABNORMAL LOW (ref 6–20)
CO2: 28 mmol/L (ref 22–32)
Calcium: 8 mg/dL — ABNORMAL LOW (ref 8.9–10.3)
Chloride: 100 mmol/L (ref 98–111)
Creatinine, Ser: 0.85 mg/dL (ref 0.61–1.24)
GFR calc Af Amer: >60 mL/min (ref >60)
GFR calc non Af Amer: >60 mL/min (ref >60)
Glucose, Bld: 97 mg/dL (ref 70–99)
Potassium: 3.4 mmol/L — ABNORMAL LOW (ref 3.5–5.1)
Sodium: 139 mmol/L (ref 135–145)
Total Bilirubin: 0.5 mg/dL (ref 0.3–1.2)
Total Protein: 7.4 g/dL (ref 6.5–8.1)

## 2019-02-23 MED ORDER — HYDROCODONE-ACETAMINOPHEN 5-325 MG PO TABS: 1.0000 | ORAL_TABLET | Freq: Four times a day (QID) | ORAL | 0 refills | Status: DC | PRN

## 2019-02-23 MED ORDER — ONDANSETRON HCL 4 MG PO TABS: 4.0000 mg | ORAL_TABLET | Freq: Three times a day (TID) | ORAL | 0 refills | Status: DC | PRN

## 2019-02-23 MED ORDER — CYCLOBENZAPRINE HCL 5 MG PO TABS: 5.0000 mg | ORAL_TABLET | Freq: Three times a day (TID) | ORAL | 0 refills | Status: DC | PRN

## 2019-02-23 MED ORDER — IOHEXOL 300 MG/ML  SOLN
75.0000 mL | Freq: Once | INTRAMUSCULAR | Status: AC | PRN
  Administered 2019-02-23: 75 mL via INTRAVENOUS

## 2019-02-23 NOTE — Telephone Encounter (Signed)
Patient states that he has chills but no fever. He reports that the base of his incision split open last night. He is concerned about the change in drainage. He has not been able to keep anything down.  I spoke with Dr Dahlia Byes and we will have the patient come in to the office to be seen today. The patient is amendable to this.

## 2019-02-23 NOTE — Patient Instructions (Addendum)
Patient is to have labs drawn today.   Patient has been scheduled for a CT abdomen/pelvis with/without contrast at _ for _ (arrive _). Prep: NPO 4 hours prior and pick up prep kit. Patient verbalizes understanding.    Open Colectomy, Care After This sheet gives you information about how to care for yourself after your procedure. Your health care provider may also give you more specific instructions. If you have problems or questions, contact your health care provider. What can I expect after the procedure? After the procedure, it is common to have:  Pain in your abdomen, especially along your incision.  Tiredness. Your energy level will return to normal over the next several weeks.  Constipation.  Nausea.  Difficulty urinating. Follow these instructions at home: Activity  You may be able to return to most of your normal activities within 1-2 weeks, such as working, walking up stairs, and sexual activity.  Avoid activities that require a lot of energy for 4-6 weeks after surgery, such as running, climbing, and lifting heavy objects. Ask your health care provider what activities are safe for you.  Take rest breaks during the day as needed.  Do not drive for 1-2 weeks or until your health care provider says that it is safe.  Do not drive or use heavy machinery while taking prescription pain medicines.  Do not lift anything that is heavier than 10 lb (4.3 kg) until your health care provider says that it is safe. Incision care   Follow instructions from your health care provider about how to take care of your incision. Make sure you: ? Wash your hands with soap and water before you change your bandage (dressing). If soap and water are not available, use hand sanitizer. ? Change your dressing as told by your health care provider. ? Leave stitches (sutures) or staples in place. These skin closures may need to stay in place for 2 weeks or longer.  Avoid wearing tight clothing around  your incision.  Protect your incision area from the sun.  Check your incision area every day for signs of infection. Check for: ? More redness, swelling, or pain. ? More fluid or blood. ? Warmth. ? Pus or a bad smell. General instructions  Do not take baths, swim, or use a hot tub until your health care provider approves. Ask your health care provider when you may shower.  Take over-the-counter and prescription medicines, including stool softeners, only as told by your health care provider.  Eat a low-fat and low-fiber diet for the first 4 weeks after surgery.  Keep all follow-up visits as told by your health care provider. This is important. Contact a health care provider if:  You have more redness, swelling, or pain around your incision.  You have more fluid or blood coming from your incision.  Your incision feels warm to the touch.  You have pus or a bad smell coming from your incision.  You have a fever or chills.  You do not have a bowel movement 2-3 days after surgery.  You cannot eat or drink for 24 hours or more.  You have persistent nausea and vomiting.  You have abdominal pain that gets worse and does not get better with medicine. Get help right away if:  You have chest pain.  You have shortness of breath.  You have pain or swelling in your legs.  Your incision breaks open after your sutures or staples have been removed.  You have bleeding from the rectum.  This information is not intended to replace advice given to you by your health care provider. Make sure you discuss any questions you have with your health care provider. Document Released: 09/19/2010 Document Revised: 02/08/2017 Document Reviewed: 11/28/2015 Elsevier Patient Education  2020 [employer].

## 2019-02-23 NOTE — Progress Notes (Signed)
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 Prolonged hospitalization. Drain are out.  He now comes with significant abdominal cramping and some nausea and diarrhea. He has been taking p.o. but has nausea and some vomiting occasionally. He also reports some drainage from the wound.   PE: NAD, walks w/o assistance Abd: soft, open wound w good granulation tissue, bottom portion w hypergranulation. No evidence of evisceration. Fascia is intact. Depth is 3 mm.  No peritonitis, no cellulitis  A/P Diarrhea and abd pain. We will check c diff since he was on a/bs, will check cbc, cmp, obtain ct a/p.  We will give him zofran and flexeril prn. He is requesting a refill on his norco. He did get a short ( 15) supply. I will refill another 15 pills until we make sure there is no other acute issues intra-abdominally.  I will f/u w CT and labs and will f/u in a few days. His abdomen is begin and he does not appear toxic or in need for hospitalization. We talked about narcotics and potential for addiction, he understands he needs to space them . Another less likely possibility is withdrawal from narcotics . I do have to prove that he does not have another abscess or other intra-abdominal issues at this time. F/u w nephrology ( pt seems to have forgotten about nephrology appt, we will make sure he continues to follow w them as well) F/U oncology We will place port once we make sure he does not have an active infection.

## 2019-02-23 NOTE — Telephone Encounter (Signed)
Per Dr.Pabon wanted patient to be notified of his recent CT scan results of "CT scan did not show active infection or anything to be concerned about. Labs showed improving kidney function." Patient is scheduled to follow up with Dr.Pabon Monday 03/02/19 at 2:00pm. Patient verbalized understanding.

## 2019-02-23 NOTE — Telephone Encounter (Signed)
-----   Message from Jules Husbands, MD sent at 02/23/2019  3:49 PM EST ----- Please let him know CT scan did not show active infection or anything to be concerned about. Labs showed improving kidney function. May f/u w me next week ----- Message ----- From: Interface, Rad Results In Sent: 02/23/2019   3:28 PM EST To: Jules Husbands, MD

## 2019-02-23 NOTE — Telephone Encounter (Signed)
Patient called and stated that he had surgery by Dr.Pabon on 01/30/19 removal of mass of colon he stated that he cant keep anything down even water, he is having bowel movements that are like water started on Saturday night, wound has some greenish discharge on bandage with a foul odor. Please call and advise

## 2019-02-25 ENCOUNTER — Other Ambulatory Visit: Payer: Self-pay

## 2019-02-25 ENCOUNTER — Telehealth: Payer: Self-pay

## 2019-02-25 ENCOUNTER — Inpatient Hospital Stay: Payer: PRIVATE HEALTH INSURANCE

## 2019-02-25 VITALS — BP 128/77 | HR 85 | Temp 96.4°F | Resp 20

## 2019-02-25 DIAGNOSIS — D509 Iron deficiency anemia, unspecified: Secondary | ICD-10-CM

## 2019-02-25 DIAGNOSIS — Z5111 Encounter for antineoplastic chemotherapy: Secondary | ICD-10-CM | POA: Diagnosis not present

## 2019-02-25 MED ORDER — SODIUM CHLORIDE 0.9 % IV SOLN
Freq: Once | INTRAVENOUS | Status: AC
  Filled 2019-02-25: qty 250

## 2019-02-25 MED ORDER — SODIUM CHLORIDE 0.9 % IV SOLN
510.0000 mg | Freq: Once | INTRAVENOUS | Status: AC
  Administered 2019-02-25: 510 mg via INTRAVENOUS
  Filled 2019-02-25: qty 510

## 2019-02-25 NOTE — Telephone Encounter (Signed)
Per Dr.Pabon- I spoke with patient regarding his C-diff and CBC labs that were ordered.   Patient stated he is feeling much better and is not having diarrhea, thought maybe it was from a stomach bug, however he stated he would not be going to the hospital to have the labs done at this time.

## 2019-02-26 NOTE — Addendum Note (Signed)
Addended by: Caroleen Hamman F on: 02/26/2019 03:05 PM   Modules accepted: Orders, SmartSet

## 2019-02-27 ENCOUNTER — Telehealth: Payer: Self-pay | Admitting: Surgery

## 2019-02-27 NOTE — Telephone Encounter (Signed)
I have called patient to go over surgery info below. No answer. I left all surgery info on patient's viocemail. I have requested patient to call back to confirm all the information below.    Surgery Date: 03/04/19 with Dr Jeremy Johann placement.  Preadmission Testing Date: 03/02/19 between 8-1:00pm-phone interview.  Covid Testing Date: 03/02/19 between 8-10:30am - patient advised to go to the [facility] ([place])  [employer] Video sent via [employer] [facility] and [employer].  Patient has been made aware to call [phone], between 1-3:00pm the day before surgery, to find out what time to arrive.

## 2019-03-02 ENCOUNTER — Ambulatory Visit (INDEPENDENT_AMBULATORY_CARE_PROVIDER_SITE_OTHER): Payer: PRIVATE HEALTH INSURANCE | Admitting: Surgery

## 2019-03-02 ENCOUNTER — Other Ambulatory Visit: Payer: Self-pay

## 2019-03-02 ENCOUNTER — Other Ambulatory Visit
Admission: RE | Admit: 2019-03-02 | Discharge: 2019-03-02 | Disposition: A | Discharge status: Home / Self Care | Payer: PRIVATE HEALTH INSURANCE | Source: Ambulatory Visit | Attending: Surgery | Admitting: Surgery

## 2019-03-02 ENCOUNTER — Encounter
Admission: RE | Admit: 2019-03-02 | Discharge: 2019-03-02 | Disposition: A | Discharge status: Home / Self Care | Payer: PRIVATE HEALTH INSURANCE | Source: Ambulatory Visit | Attending: Surgery | Admitting: Surgery

## 2019-03-02 ENCOUNTER — Encounter: Payer: Self-pay | Admitting: Surgery

## 2019-03-02 VITALS — BP 142/92 | HR 91 | Temp 97.9°F | Resp 12 | Ht 73.0 in | Wt 169.0 lb

## 2019-03-02 DIAGNOSIS — Z20828 Contact with and (suspected) exposure to other viral communicable diseases: Secondary | ICD-10-CM | POA: Insufficient documentation

## 2019-03-02 DIAGNOSIS — K6389 Other specified diseases of intestine: Secondary | ICD-10-CM

## 2019-03-02 HISTORY — DX: Anemia, unspecified: D64.9

## 2019-03-02 LAB — SARS CORONAVIRUS 2 (TAT 6-24 HRS): SARS Coronavirus 2: NEGATIVE

## 2019-03-02 NOTE — Progress Notes (Signed)
Patient ID: Tristan [name], male   DOB: Jun 13, 1980, 38 y.o.   MRN: [id number]  HPI Tristan [name] is a 38 y.o. 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  He had some diarrhea last time but this is likely related to some narcotic withdrawal.  I did perform a CT scan of the abdomen pelvis without any acute intra-abdominal findings.  He did not follow-up with his CBC normal C. difficile.  His diarrhea has resolved.  No fevers no chills.  He is here for discussion of Port-A-Cath.  HPI  Past Medical History:  Diagnosis Date  . Abdominal pain   . Anemia   . Asthma   . Cancer ([place])   . GERD (gastroesophageal reflux disease)   . Nausea   . Poor appetite   . Weight loss     Past Surgical History:  Procedure Laterality Date  . COLONOSCOPY N/A 01/30/2019   Procedure: COLONOSCOPY;  Surgeon: Jules Husbands, MD;  Location: [facility] ORS;  Service: General;  Laterality: N/A;  . COLONOSCOPY WITH PROPOFOL N/A 08/16/2016   Procedure: COLONOSCOPY WITH PROPOFOL;  Surgeon: [name], MD;  Location: [facility] ENDOSCOPY;  Service: Endoscopy;  Laterality: N/A;  . COLONOSCOPY WITH PROPOFOL N/A 01/30/2019   Procedure: COLONOSCOPY WITH PROPOFOL;  Surgeon: [name], MD;  Location: [facility] ENDOSCOPY;  Service: Gastroenterology;  Laterality: N/A;  TRAVEL CASE TO O.R. - PROCEDURE TO START AT 7:30 AM IN O.R.  . LAPAROSCOPIC RIGHT COLECTOMY N/A 01/30/2019   Procedure: LAPAROSCOPIC HAND ASSISTED RIGHT COLECTOMY CONVERTED TO OPEN PROCEDURE;  Surgeon: Jules Husbands, MD;  Location: [facility] ORS;  Service: General;  Laterality: N/A;  . NO PAST SURGERIES      Family History  Problem Relation Age of Onset  . Diabetes Mother 31       Type 1  . Healthy Father   . Colon polyps Father   . Colon cancer Other 37  . Breast cancer Other 8  . Ulcerative colitis Paternal Aunt     Social History Social History   Tobacco Use  . Smoking status: Current Every Day Smoker    Packs/day: 0.50   Types: Cigarettes  . Smokeless tobacco: Never Used  Substance Use Topics  . Alcohol use: Not Currently  . Drug use: Yes    Types: Marijuana    Comment: last week    No Known Allergies  Current Outpatient Medications  Medication Sig Dispense Refill  . acetaminophen (TYLENOL) 325 MG tablet Take 2 tablets (650 mg total) by mouth every 8 (eight) hours as needed for mild pain. (Patient taking differently: Take 650 mg by mouth every 8 (eight) hours as needed for mild pain. ) 40 tablet 0  . calcium carbonate (TUMS - DOSED IN MG ELEMENTAL CALCIUM) 500 MG chewable tablet Chew 2 tablets by mouth 2 (two) times daily as needed for indigestion or heartburn.     . cyclobenzaprine (FLEXERIL) 5 MG tablet Take 1 tablet (5 mg total) by mouth 3 (three) times daily as needed for muscle spasms. (Patient not taking: Reported on 03/02/2019) 20 tablet 0  . HYDROcodone-acetaminophen (NORCO) 5-325 MG tablet Take 1-2 tablets by mouth every 6 (six) hours as needed for moderate pain. (Patient not taking: Reported on 03/02/2019) 15 tablet 0  . ondansetron (ZOFRAN) 4 MG tablet Take 1 tablet (4 mg total) by mouth every 8 (eight) hours as needed for nausea or vomiting. (Patient taking differently: Take 4 mg by mouth every 8 (eight) hours as  needed for nausea or vomiting. ) 30 tablet 0  . ferumoxytol (FERAHEME) 510 MG/17ML SOLN injection Inject 17 mLs (510 mg total) into the vein once for 1 dose. Infuse 510mg  Feraheme IV over at least 15 minutes at day 0 and repeat 3 to 8 days later. Dilute full contents of vial (30ml) per product insert instructions before use. 2 vials(75ml) 17 mL 1  . loperamide (IMODIUM) 2 MG capsule Take 2 mg by mouth as needed for diarrhea or loose stools.     No current facility-administered medications for this visit.     Review of Systems Full ROS  was asked and was negative except for the information on the HPI  Physical Exam Blood pressure (!) 142/92, pulse 91, temperature 97.9 F (36.6 C),  resp. rate 12, height 6\' 1"  (1.854 m), weight 169 lb (76.7 kg), SpO2 98 %. CONSTITUTIONAL: NAD EYES: Pupils are equal, round, and reactive to light, Sclera are non-icteric. EARS, NOSE, MOUTH AND THROAT: The oropharynx is clear. The oral mucosa is pink and moist. Hearing is intact to voice. LYMPH NODES:  Lymph nodes in the neck are normal. RESPIRATORY:  Lungs are clear. There is normal respiratory effort, with equal breath sounds bilaterally, and without pathologic use of accessory muscles. CARDIOVASCULAR: Heart is regular without murmurs, gallops, or rubs. GI: The abdomen is soft, nontender, midline wound healing well, no infection or peritonitis There are no palpable masses. There is no hepatosplenomegaly. There are normal bowel sounds in all quadrants. GU: Rectal deferred.   MUSCULOSKELETAL: Normal muscle strength and tone. No cyanosis or edema.   SKIN: Turgor is good and there are no pathologic skin lesions or ulcers.  Work-up from NEUROLOGIC: Motor and sensation is grossly normal. Cranial nerves are grossly intact. PSYCH:  Oriented to person, place and time. Affect is normal.  Data Reviewed  I have personally reviewed the patient's imaging, laboratory findings and medical records.    Assessment/ Plan 38 year old male.  Large: Adenocarcinoma of the right side.  No evidence of active infection.  Likely his symptoms might be related to narcotic withdrawal.  He is ready for Port-A-Cath in place.  Discussed with patient in detail..  Risk, benefits and possible complications including but not limited to, bleeding, infection pneumothorax and vascular injury he understands and was proceed  Caroleen Hamman, MD FACS General Surgeon 03/02/2019, 4:22 PM

## 2019-03-02 NOTE — Patient Instructions (Signed)
Please call our office if you have questions or concerns. Your surgery is scheduled for 03/04/2019.

## 2019-03-02 NOTE — H&P (View-Only) (Signed)
Patient ID: Tristan [name], male   DOB: [birth year], 38 y.o.   MRN: [id number]  HPI Tristan [name] is a 38 y.o. 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  He had some diarrhea last time but this is likely related to some narcotic withdrawal.  I did perform a CT scan of the abdomen pelvis without any acute intra-abdominal findings.  He did not follow-up with his CBC normal C. difficile.  His diarrhea has resolved.  No fevers no chills.  He is here for discussion of Port-A-Cath.  HPI  Past Medical History:  Diagnosis Date  . Abdominal pain   . Anemia   . Asthma   . Cancer ([place])   . GERD (gastroesophageal reflux disease)   . Nausea   . Poor appetite   . Weight loss     Past Surgical History:  Procedure Laterality Date  . COLONOSCOPY N/A 01/30/2019   Procedure: COLONOSCOPY;  Surgeon: Jules Husbands, MD;  Location: [facility] ORS;  Service: General;  Laterality: N/A;  . COLONOSCOPY WITH PROPOFOL N/A 08/16/2016   Procedure: COLONOSCOPY WITH PROPOFOL;  Surgeon: [name], MD;  Location: [facility] ENDOSCOPY;  Service: Endoscopy;  Laterality: N/A;  . COLONOSCOPY WITH PROPOFOL N/A 01/30/2019   Procedure: COLONOSCOPY WITH PROPOFOL;  Surgeon: [name], MD;  Location: [facility] ENDOSCOPY;  Service: Gastroenterology;  Laterality: N/A;  TRAVEL CASE TO O.R. - PROCEDURE TO START AT 7:30 AM IN O.R.  . LAPAROSCOPIC RIGHT COLECTOMY N/A 01/30/2019   Procedure: LAPAROSCOPIC HAND ASSISTED RIGHT COLECTOMY CONVERTED TO OPEN PROCEDURE;  Surgeon: Jules Husbands, MD;  Location: [facility] ORS;  Service: General;  Laterality: N/A;  . NO PAST SURGERIES      Family History  Problem Relation Age of Onset  . Diabetes Mother 56       Type 1  . Healthy Father   . Colon polyps Father   . Colon cancer Other 2  . Breast cancer Other 85  . Ulcerative colitis Paternal Aunt     Social History Social History   Tobacco Use  . Smoking status: Current Every Day Smoker    Packs/day: 0.50   Types: Cigarettes  . Smokeless tobacco: Never Used  Substance Use Topics  . Alcohol use: Not Currently  . Drug use: Yes    Types: Marijuana    Comment: last week    No Known Allergies  Current Outpatient Medications  Medication Sig Dispense Refill  . acetaminophen (TYLENOL) 325 MG tablet Take 2 tablets (650 mg total) by mouth every 8 (eight) hours as needed for mild pain. (Patient taking differently: Take 650 mg by mouth every 8 (eight) hours as needed for mild pain. ) 40 tablet 0  . calcium carbonate (TUMS - DOSED IN MG ELEMENTAL CALCIUM) 500 MG chewable tablet Chew 2 tablets by mouth 2 (two) times daily as needed for indigestion or heartburn.     . cyclobenzaprine (FLEXERIL) 5 MG tablet Take 1 tablet (5 mg total) by mouth 3 (three) times daily as needed for muscle spasms. (Patient not taking: Reported on 03/02/2019) 20 tablet 0  . HYDROcodone-acetaminophen (NORCO) 5-325 MG tablet Take 1-2 tablets by mouth every 6 (six) hours as needed for moderate pain. (Patient not taking: Reported on 03/02/2019) 15 tablet 0  . ondansetron (ZOFRAN) 4 MG tablet Take 1 tablet (4 mg total) by mouth every 8 (eight) hours as needed for nausea or vomiting. (Patient taking differently: Take 4 mg by mouth every 8 (eight) hours as  needed for nausea or vomiting. ) 30 tablet 0  . ferumoxytol (FERAHEME) 510 MG/17ML SOLN injection Inject 17 mLs (510 mg total) into the vein once for 1 dose. Infuse 510mg  Feraheme IV over at least 15 minutes at day 0 and repeat 3 to 8 days later. Dilute full contents of vial (61ml) per product insert instructions before use. 2 vials(73ml) 17 mL 1  . loperamide (IMODIUM) 2 MG capsule Take 2 mg by mouth as needed for diarrhea or loose stools.     No current facility-administered medications for this visit.     Review of Systems Full ROS  was asked and was negative except for the information on the HPI  Physical Exam Blood pressure (!) 142/92, pulse 91, temperature 97.9 F (36.6 C),  resp. rate 12, height 6\' 1"  (1.854 m), weight 169 lb (76.7 kg), SpO2 98 %. CONSTITUTIONAL: NAD EYES: Pupils are equal, round, and reactive to light, Sclera are non-icteric. EARS, NOSE, MOUTH AND THROAT: The oropharynx is clear. The oral mucosa is pink and moist. Hearing is intact to voice. LYMPH NODES:  Lymph nodes in the neck are normal. RESPIRATORY:  Lungs are clear. There is normal respiratory effort, with equal breath sounds bilaterally, and without pathologic use of accessory muscles. CARDIOVASCULAR: Heart is regular without murmurs, gallops, or rubs. GI: The abdomen is soft, nontender, midline wound healing well, no infection or peritonitis There are no palpable masses. There is no hepatosplenomegaly. There are normal bowel sounds in all quadrants. GU: Rectal deferred.   MUSCULOSKELETAL: Normal muscle strength and tone. No cyanosis or edema.   SKIN: Turgor is good and there are no pathologic skin lesions or ulcers.  Work-up from NEUROLOGIC: Motor and sensation is grossly normal. Cranial nerves are grossly intact. PSYCH:  Oriented to person, place and time. Affect is normal.  Data Reviewed  I have personally reviewed the patient's imaging, laboratory findings and medical records.    Assessment/ Plan 38 year old male.  Large: Adenocarcinoma of the right side.  No evidence of active infection.  Likely his symptoms might be related to narcotic withdrawal.  He is ready for Port-A-Cath in place.  Discussed with patient in detail..  Risk, benefits and possible complications including but not limited to, bleeding, infection pneumothorax and vascular injury he understands and was proceed  Caroleen Hamman, MD FACS General Surgeon 03/02/2019, 4:22 PM

## 2019-03-02 NOTE — Patient Instructions (Signed)
Your procedure is scheduled on: 03-04-19 [facility] Report to Same Day Surgery 2nd floor medical mall [facility] Entrance-take elevator on left to 2nd floor.  Check in with surgery information desk.) To find out your arrival time please call [phone] between 1PM - 3PM on 03-03-19 TUESDAY  Remember: Instructions that are not followed completely may result in serious medical risk, up to and including death, or upon the discretion of your surgeon and anesthesiologist your surgery may need to be rescheduled.    _x___ 1. Do not eat food after midnight the night before your procedure. NO GUM OR CANDY AFTER MIDNIGHT. You may drink clear liquids up to 2 hours before you are scheduled to arrive at the hospital for your procedure.  Do not drink clear liquids within 2 hours of your scheduled arrival to the hospital.  Clear liquids include  --Water or Apple juice without pulp  --Gatorade  --Black Coffee or Clear Tea (No milk, no creamers, do not add anything to the coffee or Tea   ____Ensure clear carbohydrate drink on the way to the hospital for bariatric patients  ____Ensure clear carbohydrate drink 3 hours before surgery.    __x__ 2. No Alcohol for 24 hours before or after surgery.   __x__3. No Smoking or e-cigarettes for 24 prior to surgery.  Do not use any chewable tobacco products for at least 6 hour prior to surgery   ____  4. Bring all medications with you on the day of surgery if instructed.    __x__ 5. Notify your doctor if there is any change in your medical condition     (cold, fever, infections).    x___6. On the morning of surgery brush your teeth with toothpaste and water.  You may rinse your mouth with mouth wash if you wish.  Do not swallow any toothpaste or mouthwash.   Do not wear jewelry, make-up, hairpins, clips or nail polish.  Do not wear lotions, powders, or perfumes. You may wear deodorant.  Do not shave 48 hours prior to surgery. Men may shave face and  neck.  Do not bring valuables to the hospital.    [facility] is not responsible for any belongings or valuables.               Contacts, dentures or bridgework may not be worn into surgery.  Leave your suitcase in the car. After surgery it may be brought to your room.  For patients admitted to the hospital, discharge time is determined by your  treatment team.  _  Patients discharged the day of surgery will not be allowed to drive home.  You will need someone to drive you home and stay with you the night of your procedure.    Please read over the following fact sheets that you were given:   [facility] Preparing for Surgery   ____ Take anti-hypertensive listed below, cardiac, seizure, asthma, anti-reflux and psychiatric medicines. These include:  1. NONE  2.  3.  4.  5.  6.  ____Fleets enema or Magnesium Citrate as directed.   ____ Use CHG Soap or sage wipes as directed on instruction sheet   ____ Use inhalers on the day of surgery and bring to hospital day of surgery  ____ Stop Metformin and Janumet 2 days prior to surgery.    ____ Take 1/2 of usual insulin dose the night before surgery and none on the morning surgery.   ____ Follow recommendations from Cardiologist, Pulmonologist or PCP regarding  stopping Aspirin, Coumadin, Plavix ,Eliquis, Effient, or Pradaxa, and Pletal.  X____Stop Anti-inflammatories such as Advil, Aleve, Ibuprofen, Motrin, Naproxen, Naprosyn, Goodies powders or aspirin products NOW-OK to take Tylenol    ____ Stop supplements until after surgery.     ____ Bring C-Pap to the hospital.

## 2019-03-04 ENCOUNTER — Other Ambulatory Visit: Payer: Self-pay

## 2019-03-04 ENCOUNTER — Ambulatory Visit
Admission: RE | Admit: 2019-03-04 | Discharge: 2019-03-04 | Disposition: A | Discharge status: Home / Self Care | Payer: PRIVATE HEALTH INSURANCE | Attending: Surgery | Admitting: Surgery

## 2019-03-04 ENCOUNTER — Encounter
Admission: RE | Disposition: A | Discharge status: Home / Self Care | Payer: Self-pay | Source: Home / Self Care | Attending: Surgery

## 2019-03-04 ENCOUNTER — Ambulatory Visit: Payer: Self-pay | Admitting: Certified Registered Nurse Anesthetist

## 2019-03-04 ENCOUNTER — Encounter: Payer: Self-pay | Admitting: Surgery

## 2019-03-04 ENCOUNTER — Encounter: Payer: PRIVATE HEALTH INSURANCE | Admitting: Surgery

## 2019-03-04 ENCOUNTER — Ambulatory Visit
Admission: RE | Admit: 2019-03-04 | Discharge: 2019-03-04 | Disposition: A | Discharge status: Home / Self Care | Payer: Self-pay | Source: Home / Self Care | Attending: Surgery | Admitting: Surgery

## 2019-03-04 ENCOUNTER — Ambulatory Visit: Payer: Self-pay

## 2019-03-04 DIAGNOSIS — K219 Gastro-esophageal reflux disease without esophagitis: Secondary | ICD-10-CM | POA: Insufficient documentation

## 2019-03-04 DIAGNOSIS — K6389 Other specified diseases of intestine: Secondary | ICD-10-CM | POA: Diagnosis not present

## 2019-03-04 DIAGNOSIS — C189 Malignant neoplasm of colon, unspecified: Secondary | ICD-10-CM | POA: Insufficient documentation

## 2019-03-04 DIAGNOSIS — F17210 Nicotine dependence, cigarettes, uncomplicated: Secondary | ICD-10-CM | POA: Insufficient documentation

## 2019-03-04 DIAGNOSIS — J45909 Unspecified asthma, uncomplicated: Secondary | ICD-10-CM | POA: Insufficient documentation

## 2019-03-04 HISTORY — PX: PORTACATH PLACEMENT: SHX2246

## 2019-03-04 LAB — URINE DRUG SCREEN, QUALITATIVE (ARMC ONLY)
Amphetamines, Ur Screen: NOT DETECTED
Barbiturates, Ur Screen: NOT DETECTED
Benzodiazepine, Ur Scrn: NOT DETECTED
Cannabinoid 50 Ng, Ur ~~LOC~~: POSITIVE — AB
Cocaine Metabolite,Ur ~~LOC~~: NOT DETECTED
MDMA (Ecstasy)Ur Screen: NOT DETECTED
Methadone Scn, Ur: NOT DETECTED
Opiate, Ur Screen: NOT DETECTED
Phencyclidine (PCP) Ur S: NOT DETECTED
Tricyclic, Ur Screen: NOT DETECTED

## 2019-03-04 LAB — HEMOGLOBIN: Hemoglobin: 11.9 g/dL — ABNORMAL LOW (ref 13.0–17.0)

## 2019-03-04 SURGERY — INSERTION, TUNNELED CENTRAL VENOUS DEVICE, WITH PORT
Anesthesia: General | Site: Chest | Laterality: Right

## 2019-03-04 MED ORDER — ACETAMINOPHEN 500 MG PO TABS
ORAL_TABLET | ORAL | Status: AC
  Administered 2019-03-04: 1000 mg via ORAL
  Filled 2019-03-04: qty 2

## 2019-03-04 MED ORDER — GABAPENTIN 300 MG PO CAPS
ORAL_CAPSULE | ORAL | Status: AC
  Administered 2019-03-04: 300 mg via ORAL
  Filled 2019-03-04: qty 1

## 2019-03-04 MED ORDER — PROPOFOL 10 MG/ML IV BOLUS
INTRAVENOUS | Status: DC | PRN
  Administered 2019-03-04: 30 mg via INTRAVENOUS
  Administered 2019-03-04 (×2): 20 mg via INTRAVENOUS

## 2019-03-04 MED ORDER — PROPOFOL 500 MG/50ML IV EMUL
INTRAVENOUS | Status: AC
  Filled 2019-03-04: qty 50

## 2019-03-04 MED ORDER — ONDANSETRON HCL 4 MG/2ML IJ SOLN: 4.0000 mg | Freq: Once | INTRAMUSCULAR | Status: DC | PRN

## 2019-03-04 MED ORDER — FAMOTIDINE 20 MG PO TABS: 20.0000 mg | ORAL_TABLET | Freq: Once | ORAL | Status: AC

## 2019-03-04 MED ORDER — HEPARIN SODIUM (PORCINE) 5000 UNIT/ML IJ SOLN
INTRAMUSCULAR | Status: AC
  Filled 2019-03-04: qty 1

## 2019-03-04 MED ORDER — LIDOCAINE HCL (PF) 1 % IJ SOLN
INTRAMUSCULAR | Status: DC | PRN
  Administered 2019-03-04: 10 mL

## 2019-03-04 MED ORDER — SUGAMMADEX SODIUM 500 MG/5ML IV SOLN
INTRAVENOUS | Status: AC
  Filled 2019-03-04: qty 5

## 2019-03-04 MED ORDER — FENTANYL CITRATE (PF) 100 MCG/2ML IJ SOLN
INTRAMUSCULAR | Status: AC
  Filled 2019-03-04: qty 2

## 2019-03-04 MED ORDER — FENTANYL CITRATE (PF) 100 MCG/2ML IJ SOLN: 25.0000 ug | INTRAMUSCULAR | Status: DC | PRN

## 2019-03-04 MED ORDER — BUPIVACAINE-EPINEPHRINE (PF) 0.25% -1:200000 IJ SOLN
INTRAMUSCULAR | Status: AC
  Filled 2019-03-04: qty 30

## 2019-03-04 MED ORDER — FAMOTIDINE 20 MG PO TABS
ORAL_TABLET | ORAL | Status: AC
  Administered 2019-03-04: 20 mg via ORAL
  Filled 2019-03-04: qty 1

## 2019-03-04 MED ORDER — CHLORHEXIDINE GLUCONATE CLOTH 2 % EX PADS
6.0000 | MEDICATED_PAD | Freq: Once | CUTANEOUS | Status: AC
  Administered 2019-03-04: 6 via TOPICAL

## 2019-03-04 MED ORDER — PROPOFOL 500 MG/50ML IV EMUL
INTRAVENOUS | Status: DC | PRN
  Administered 2019-03-04: 50 ug/kg/min via INTRAVENOUS

## 2019-03-04 MED ORDER — CEFAZOLIN SODIUM-DEXTROSE 2-4 GM/100ML-% IV SOLN
2.0000 g | INTRAVENOUS | Status: AC
  Administered 2019-03-04: 2 g via INTRAVENOUS

## 2019-03-04 MED ORDER — MIDAZOLAM HCL 2 MG/2ML IJ SOLN
INTRAMUSCULAR | Status: DC | PRN
  Administered 2019-03-04: 2 mg via INTRAVENOUS

## 2019-03-04 MED ORDER — BUPIVACAINE-EPINEPHRINE (PF) 0.25% -1:200000 IJ SOLN
INTRAMUSCULAR | Status: DC | PRN
  Administered 2019-03-04: 10 mL

## 2019-03-04 MED ORDER — ONDANSETRON HCL 4 MG/2ML IJ SOLN
INTRAMUSCULAR | Status: DC | PRN
  Administered 2019-03-04: 4 mg via INTRAVENOUS

## 2019-03-04 MED ORDER — LACTATED RINGERS IV SOLN: INTRAVENOUS | Status: DC

## 2019-03-04 MED ORDER — ACETAMINOPHEN 500 MG PO TABS: 1000.0000 mg | ORAL_TABLET | ORAL | Status: AC

## 2019-03-04 MED ORDER — CEFAZOLIN SODIUM-DEXTROSE 2-4 GM/100ML-% IV SOLN
INTRAVENOUS | Status: AC
  Filled 2019-03-04: qty 100

## 2019-03-04 MED ORDER — CHLORHEXIDINE GLUCONATE CLOTH 2 % EX PADS: 6.0000 | MEDICATED_PAD | Freq: Once | CUTANEOUS | Status: AC

## 2019-03-04 MED ORDER — MIDAZOLAM HCL 2 MG/2ML IJ SOLN
INTRAMUSCULAR | Status: AC
  Filled 2019-03-04: qty 2

## 2019-03-04 MED ORDER — GABAPENTIN 300 MG PO CAPS: 300.0000 mg | ORAL_CAPSULE | ORAL | Status: AC

## 2019-03-04 MED ORDER — SODIUM CHLORIDE 0.9 % IV SOLN
INTRAVENOUS | Status: DC | PRN
  Administered 2019-03-04: 5 mL via INTRAMUSCULAR

## 2019-03-04 MED ORDER — DEXMEDETOMIDINE HCL 200 MCG/2ML IV SOLN
INTRAVENOUS | Status: DC | PRN
  Administered 2019-03-04: 4 ug via INTRAVENOUS
  Administered 2019-03-04 (×2): 8 ug via INTRAVENOUS

## 2019-03-04 MED ORDER — ONDANSETRON HCL 4 MG/2ML IJ SOLN
INTRAMUSCULAR | Status: AC
  Filled 2019-03-04: qty 2

## 2019-03-04 MED ORDER — FENTANYL CITRATE (PF) 100 MCG/2ML IJ SOLN
INTRAMUSCULAR | Status: DC | PRN
  Administered 2019-03-04 (×2): 25 ug via INTRAVENOUS

## 2019-03-04 SURGICAL SUPPLY — 36 items
BAG DECANTER FOR FLEXI CONT (MISCELLANEOUS) ×3 IMPLANT
BLADE SURG SZ11 CARB STEEL (BLADE) ×3 IMPLANT
BOOT SUTURE AID YELLOW STND (SUTURE) ×3 IMPLANT
CANISTER SUCT 1200ML W/VALVE (MISCELLANEOUS) ×3 IMPLANT
CHLORAPREP W/TINT 26 (MISCELLANEOUS) ×3 IMPLANT
COVER LIGHT HANDLE STERIS (MISCELLANEOUS) ×6 IMPLANT
COVER WAND RF STERILE (DRAPES) ×3 IMPLANT
DERMABOND ADVANCED (GAUZE/BANDAGES/DRESSINGS) ×2
DERMABOND ADVANCED .7 DNX12 (GAUZE/BANDAGES/DRESSINGS) ×1 IMPLANT
DRAPE 3/4 80X56 (DRAPES) ×3 IMPLANT
DRAPE C-ARM XRAY 36X54 (DRAPES) ×6 IMPLANT
DRAPE INCISE IOBAN 66X45 STRL (DRAPES) ×3 IMPLANT
ELECT CAUTERY BLADE 6.4 (BLADE) ×3 IMPLANT
ELECT REM PT RETURN 9FT ADLT (ELECTROSURGICAL) ×3
ELECTRODE REM PT RTRN 9FT ADLT (ELECTROSURGICAL) ×1 IMPLANT
GEL ULTRASOUND 20GR AQUASONIC (MISCELLANEOUS) ×3 IMPLANT
GLOVE BIO SURGEON STRL SZ7 (GLOVE) ×3 IMPLANT
GOWN STRL REUS W/ TWL LRG LVL3 (GOWN DISPOSABLE) ×2 IMPLANT
GOWN STRL REUS W/TWL LRG LVL3 (GOWN DISPOSABLE) ×4
IV NS 500ML (IV SOLUTION) ×2
IV NS 500ML BAXH (IV SOLUTION) ×1 IMPLANT
KIT PORT POWER 8FR ISP CVUE (Port) ×3 IMPLANT
NEEDLE HYPO 22GX1.5 SAFETY (NEEDLE) ×3 IMPLANT
NS IRRIG 1000ML POUR BTL (IV SOLUTION) ×3 IMPLANT
PACK PORT-A-CATH (MISCELLANEOUS) ×3 IMPLANT
SPONGE LAP 18X18 RF (DISPOSABLE) ×3 IMPLANT
SUT MNCRL AB 4-0 PS2 18 (SUTURE) ×3 IMPLANT
SUT PROLENE 2-0 (SUTURE) ×2
SUT PROLENE 2-0 RB1 36X2 ARM (SUTURE) ×1
SUT VIC AB 3-0 SH 27 (SUTURE) ×2
SUT VIC AB 3-0 SH 27X BRD (SUTURE) ×1 IMPLANT
SUTURE PROLEN 2-0 RB1 36X2 ARM (SUTURE) ×1 IMPLANT
SYR 10ML LL (SYRINGE) ×3 IMPLANT
SYR 20ML LL LF (SYRINGE) ×3 IMPLANT
SYR 5ML LL (SYRINGE) ×3 IMPLANT
TOWEL OR 17X26 4PK STRL BLUE (TOWEL DISPOSABLE) ×3 IMPLANT

## 2019-03-04 NOTE — Discharge Instructions (Addendum)

## 2019-03-04 NOTE — Anesthesia Post-op Follow-up Note (Signed)
Anesthesia QCDR form completed.        

## 2019-03-04 NOTE — Transfer of Care (Signed)
Immediate Anesthesia Transfer of Care Note  Patient: Tristan [name]  Procedure(s) Performed: INSERTION PORT-A-CATH (Right Chest)  Patient Location: PACU  Anesthesia Type:General  Level of Consciousness: awake, alert  and oriented  Airway & Oxygen Therapy: Patient Spontanous Breathing  Post-op Assessment: Report given to RN and Post -op Vital signs reviewed and stable  Post vital signs: Reviewed and stable  Last Vitals:  Vitals Value Taken Time  BP 108/66 03/04/19 1428  Temp    Pulse 65 03/04/19 1429  Resp 22 03/04/19 1429  SpO2 98 % 03/04/19 1429  Vitals shown include unvalidated device data.  Last Pain:  Vitals:   03/04/19 1059  TempSrc: Tympanic  PainSc: 0-No pain      Patients Stated Pain Goal: 0 ([id number])  Complications: No apparent anesthesia complications

## 2019-03-04 NOTE — Op Note (Signed)
  Pre-operative Diagnosis: colon Ca  Post-operative Diagnosis: same   Surgeon: Caroleen Hamman, MD FACS  Anesthesia: IV sedation, marcaine .25% w epi and lidocaine 1%  Procedure: right IJ  Port placement with fluoroscopy under U/S guidance  Findings: Good position of the tip of the catheter by fluoroscopy  Estimated Blood Loss: Minimal         Drains: None         Specimens: None       Complications: none      Procedure Details  The patient was seen again in the Holding Room. The benefits, complications, treatment options, and expected outcomes were discussed with the patient. The risks of bleeding, infection, recurrence of symptoms, failure to resolve symptoms,  thrombosis nonfunction breakage pneumothorax hemopneumothorax any of which could require chest tube or further surgery were reviewed with the patient.   The patient was taken to Operating Room, identified as Tristan [name] and the procedure verified.  A Time Out was held and the above information confirmed.  Prior to the induction of general anesthesia, antibiotic prophylaxis was administered. VTE prophylaxis was in place. Appropriate anesthesia was then administered and tolerated well. The chest was prepped with Chloraprep and draped in the sterile fashion. The patient was positioned in the supine position. Then the patient was placed in Trendelenburg position.  Patient was prepped and draped in sterile fashion and in a Trendelenburg position local anesthetic was infiltrated into the skin and subcutaneous tissues in the neck and anterior chest wall. The large bore needle was placed into the internal jugular vein under U/S guidance without difficulty and then the Seldinger wire was advanced. Fluoroscopy was utilized to confirm that the Seldinger wire was in the superior vena cava.  An incision was made and a port pocket developed with blunt and electrocautery dissection. The introducer dilator was placed over the Seldinger wire  the wire was removed. The previously flushed catheter was placed into the introducer dilator and the peel-away sheath was removed. The catheter length was confirmed and trimmed utilizing fluoroscopy for proper positioning. The catheter was then attached to the previously flushed port. The port was placed into the pocket. The port was held in with 2-0 Prolenes and flushed for function and heparin locked.  The wound was closed with interrupted 3-0 Vicryl followed by 4-0 subcuticular Monocryl sutures. Dermabond used to coat the skin  Patient was taken to the recovery room in stable condition where a postoperative chest film has been ordered.

## 2019-03-04 NOTE — Interval H&P Note (Signed)
History and Physical Interval Note:  03/04/2019 11:03 AM  Tristan [name]  has presented today for surgery, with the diagnosis of Colon cancer.  The various methods of treatment have been discussed with the patient and family. After consideration of risks, benefits and other options for treatment, the patient has consented to  Procedure(s): INSERTION PORT-A-CATH (N/A) as a surgical intervention.  The patient's history has been reviewed, patient examined, no change in status, stable for surgery.  I have reviewed the patient's chart and labs.  Questions were answered to the patient's satisfaction.    Edison Simon, PA-C 03/04/2019 11:03 AM

## 2019-03-04 NOTE — Anesthesia Preprocedure Evaluation (Signed)
Anesthesia Evaluation  Patient identified by MRN, date of birth, ID band Patient awake    Reviewed: Allergy & Precautions, NPO status , Patient's Chart, lab work & pertinent test results  History of Anesthesia Complications Negative for: history of anesthetic complications  Airway Mallampati: II       Dental   Pulmonary neg sleep apnea, neg COPD, Current Smoker and Patient abstained from smoking.,           Cardiovascular (-) hypertension(-) Past MI and (-) CHF (-) dysrhythmias (-) Valvular Problems/Murmurs     Neuro/Psych neg Seizures    GI/Hepatic Neg liver ROS, GERD (OTC meds)  ,  Endo/Other  neg diabetes  Renal/GU negative Renal ROS     Musculoskeletal   Abdominal   Peds  Hematology   Anesthesia Other Findings   Reproductive/Obstetrics                             Anesthesia Physical Anesthesia Plan  ASA: II  Anesthesia Plan: General   Post-op Pain Management:    Induction: Intravenous  PONV Risk Score and Plan: 1 and Propofol infusion  Airway Management Planned:   Additional Equipment:   Intra-op Plan:   Post-operative Plan:   Informed Consent: I have reviewed the patients History and Physical, chart, labs and discussed the procedure including the risks, benefits and alternatives for the proposed anesthesia with the patient or authorized representative who has indicated his/her understanding and acceptance.       Plan Discussed with:   Anesthesia Plan Comments:         Anesthesia Quick Evaluation

## 2019-03-05 NOTE — Anesthesia Postprocedure Evaluation (Signed)
Anesthesia Post Note  Patient: Tristan [name]  Procedure(s) Performed: INSERTION PORT-A-CATH (Right Chest)  Patient location during evaluation: PACU Anesthesia Type: General Level of consciousness: awake and alert Pain management: pain level controlled Vital Signs Assessment: post-procedure vital signs reviewed and stable Respiratory status: spontaneous breathing and respiratory function stable Cardiovascular status: blood pressure returned to baseline and stable Anesthetic complications: no     Last Vitals:  Vitals:   03/04/19 1500 03/04/19 1518  BP: 112/66 110/71  Pulse: (!) 57 (!) 57  Resp: 16 16  Temp: 36.5 C   SpO2: 100% 100%    Last Pain:  Vitals:   03/04/19 1518  TempSrc:   PainSc: 0-No pain                 Samson Ralph K

## 2019-03-09 ENCOUNTER — Other Ambulatory Visit: Payer: Self-pay | Admitting: *Deleted

## 2019-03-09 ENCOUNTER — Other Ambulatory Visit: Payer: Self-pay

## 2019-03-09 DIAGNOSIS — C189 Malignant neoplasm of colon, unspecified: Secondary | ICD-10-CM

## 2019-03-09 NOTE — Progress Notes (Signed)
Patient pre screened for office appointment, no questions or concerns today. Patient reminded of upcoming appointment time and date. 

## 2019-03-10 ENCOUNTER — Inpatient Hospital Stay: Payer: PRIVATE HEALTH INSURANCE

## 2019-03-10 ENCOUNTER — Other Ambulatory Visit: Payer: Self-pay

## 2019-03-10 ENCOUNTER — Inpatient Hospital Stay (HOSPITAL_BASED_OUTPATIENT_CLINIC_OR_DEPARTMENT_OTHER): Payer: PRIVATE HEALTH INSURANCE | Admitting: Oncology

## 2019-03-10 VITALS — BP 132/83 | HR 76 | Temp 96.4°F | Resp 16 | Ht 73.0 in | Wt 172.6 lb

## 2019-03-10 DIAGNOSIS — C188 Malignant neoplasm of overlapping sites of colon: Secondary | ICD-10-CM | POA: Diagnosis not present

## 2019-03-10 DIAGNOSIS — C189 Malignant neoplasm of colon, unspecified: Secondary | ICD-10-CM

## 2019-03-10 DIAGNOSIS — D509 Iron deficiency anemia, unspecified: Secondary | ICD-10-CM | POA: Diagnosis not present

## 2019-03-10 DIAGNOSIS — Z7189 Other specified counseling: Secondary | ICD-10-CM | POA: Diagnosis not present

## 2019-03-10 DIAGNOSIS — Z5111 Encounter for antineoplastic chemotherapy: Secondary | ICD-10-CM

## 2019-03-10 LAB — CBC WITH DIFFERENTIAL/PLATELET
Abs Immature Granulocytes: 0.04 10*3/uL (ref 0.00–0.07)
Basophils Absolute: 0 10*3/uL (ref 0.0–0.1)
Basophils Relative: 1 %
Eosinophils Absolute: 0.4 10*3/uL (ref 0.0–0.5)
Eosinophils Relative: 6 %
HCT: 35.1 % — ABNORMAL LOW (ref 39.0–52.0)
Hemoglobin: 10.7 g/dL — ABNORMAL LOW (ref 13.0–17.0)
Immature Granulocytes: 1 %
Lymphocytes Relative: 31 %
Lymphs Abs: 2 10*3/uL (ref 0.7–4.0)
MCH: 25.8 pg — ABNORMAL LOW (ref 26.0–34.0)
MCHC: 30.5 g/dL (ref 30.0–36.0)
MCV: 84.8 fL (ref 80.0–100.0)
Monocytes Absolute: 0.6 10*3/uL (ref 0.1–1.0)
Monocytes Relative: 10 %
Neutro Abs: 3.5 10*3/uL (ref 1.7–7.7)
Neutrophils Relative %: 51 %
Platelets: 181 10*3/uL (ref 150–400)
RBC: 4.14 MIL/uL — ABNORMAL LOW (ref 4.22–5.81)
RDW: 21.7 % — ABNORMAL HIGH (ref 11.5–15.5)
WBC: 6.6 10*3/uL (ref 4.0–10.5)
nRBC: 0 % (ref 0.0–0.2)

## 2019-03-10 LAB — COMPREHENSIVE METABOLIC PANEL
ALT: 19 U/L (ref 0–44)
AST: 19 U/L (ref 15–41)
Albumin: 3.6 g/dL (ref 3.5–5.0)
Alkaline Phosphatase: 69 U/L (ref 38–126)
Anion gap: 9 (ref 5–15)
BUN: 7 mg/dL (ref 6–20)
CO2: 26 mmol/L (ref 22–32)
Calcium: 8.4 mg/dL — ABNORMAL LOW (ref 8.9–10.3)
Chloride: 106 mmol/L (ref 98–111)
Creatinine, Ser: 0.67 mg/dL (ref 0.61–1.24)
GFR calc Af Amer: >60 mL/min (ref >60)
GFR calc non Af Amer: >60 mL/min (ref >60)
Glucose, Bld: 114 mg/dL — ABNORMAL HIGH (ref 70–99)
Potassium: 2.9 mmol/L — ABNORMAL LOW (ref 3.5–5.1)
Sodium: 141 mmol/L (ref 135–145)
Total Bilirubin: 0.4 mg/dL (ref 0.3–1.2)
Total Protein: 6.9 g/dL (ref 6.5–8.1)

## 2019-03-10 MED ORDER — SODIUM CHLORIDE 0.9 % IV SOLN
5000.0000 mg | INTRAVENOUS | Status: DC
  Administered 2019-03-10: 5000 mg via INTRAVENOUS
  Filled 2019-03-10: qty 100

## 2019-03-10 MED ORDER — DEXTROSE 5 % IV SOLN
Freq: Once | INTRAVENOUS | Status: AC
  Filled 2019-03-10: qty 250

## 2019-03-10 MED ORDER — SODIUM CHLORIDE 0.9% FLUSH
10.0000 mL | INTRAVENOUS | Status: DC | PRN
  Administered 2019-03-10: 10 mL via INTRAVENOUS
  Filled 2019-03-10: qty 10

## 2019-03-10 MED ORDER — LIDOCAINE-PRILOCAINE 2.5-2.5 % EX CREA: 1.0000 "application " | TOPICAL_CREAM | CUTANEOUS | 1 refills | Status: DC

## 2019-03-10 MED ORDER — PROCHLORPERAZINE MALEATE 10 MG PO TABS: 10.0000 mg | ORAL_TABLET | Freq: Four times a day (QID) | ORAL | 1 refills | Status: DC | PRN

## 2019-03-10 MED ORDER — LEUCOVORIN CALCIUM INJECTION 350 MG
800.0000 mg | Freq: Once | INTRAVENOUS | Status: AC
  Administered 2019-03-10: 800 mg via INTRAVENOUS
  Filled 2019-03-10: qty 40

## 2019-03-10 MED ORDER — OXALIPLATIN CHEMO INJECTION 100 MG/20ML
85.0000 mg/m2 | Freq: Once | INTRAVENOUS | Status: AC
  Administered 2019-03-10: 170 mg via INTRAVENOUS
  Filled 2019-03-10: qty 34

## 2019-03-10 MED ORDER — FLUOROURACIL CHEMO INJECTION 2.5 GM/50ML
400.0000 mg/m2 | Freq: Once | INTRAVENOUS | Status: AC
  Administered 2019-03-10: 800 mg via INTRAVENOUS
  Filled 2019-03-10: qty 16

## 2019-03-10 MED ORDER — SODIUM CHLORIDE 0.9 % IV SOLN
10.0000 mg | Freq: Once | INTRAVENOUS | Status: AC
  Administered 2019-03-10: 10 mg via INTRAVENOUS
  Filled 2019-03-10: qty 1

## 2019-03-10 MED ORDER — PALONOSETRON HCL INJECTION 0.25 MG/5ML
0.2500 mg | Freq: Once | INTRAVENOUS | Status: AC
  Administered 2019-03-10: 0.25 mg via INTRAVENOUS
  Filled 2019-03-10: qty 5

## 2019-03-10 NOTE — Progress Notes (Signed)
Pt tolerated infusion well. Pt denies any concerns or complaints at this time. No s/s of distress noted. Home infusion pump reviewed with pt and home infusion spill kit provided. Pt educated to call clinic with any questions or concerns, pt verbalizes understanding. Pt stable at discharge.  

## 2019-03-10 NOTE — Progress Notes (Signed)
PSN met with patient today due to his inability to work due to diagnosis and treatment.  Patient reported that he currently does not have any financial or social needs.  PSN informed patient that there were resources available to him if needed.  PSN gave patient his contact information so that he can call if needs arose.

## 2019-03-10 NOTE — Progress Notes (Signed)
START ON PATHWAY REGIMEN - Colorectal     A cycle is every 14 days:     Oxaliplatin      Leucovorin      Fluorouracil      Fluorouracil   **Always confirm dose/schedule in your pharmacy ordering system**  Patient Characteristics: Postoperative without Neoadjuvant Therapy (Pathologic Staging), Colon, Stage III, High Risk (pT4 or pN2) Tumor Location: Colon Therapeutic Status: Postoperative without Neoadjuvant Therapy (Pathologic Staging) AJCC M Category: cM0 AJCC T Category: pT4a AJCC N Category: pN1b AJCC 8 Stage Grouping: IIIB Intent of Therapy: Curative Intent, Discussed with Patient

## 2019-03-10 NOTE — Progress Notes (Signed)
Pt new today and has no c/o

## 2019-03-11 ENCOUNTER — Telehealth: Payer: Self-pay | Admitting: *Deleted

## 2019-03-11 ENCOUNTER — Telehealth: Payer: Self-pay

## 2019-03-11 DIAGNOSIS — E876 Hypokalemia: Secondary | ICD-10-CM

## 2019-03-11 LAB — CEA: CEA: 1.6 ng/mL (ref 0.0–4.7)

## 2019-03-11 MED ORDER — POTASSIUM CHLORIDE CRYS ER 20 MEQ PO TBCR: 20.0000 meq | EXTENDED_RELEASE_TABLET | Freq: Every day | ORAL | 0 refills | Status: DC

## 2019-03-11 NOTE — Telephone Encounter (Signed)
Called pt and went over potassium level and he needs to take 1 pill a day. Pt agreeable to this.  He did talk to me about the cold sensitivity. He went outside with kids and his hands started hurting him but when he went inside it went away. I reminded him of the cold sensitivity for 3 days and can be up to 5 days. He says he can tell it with water also. It is room temperature but if he makes it luke warm it is good. He will call back if he has more questions

## 2019-03-11 NOTE — Telephone Encounter (Signed)
Telephone call to patient for follow up after receiving first infusion yesterday.   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   States drinking fluids at room temp now.  States eating and drinking fluids well. States feeling good.  Encouraged patient to call for any questions or concerns.

## 2019-03-11 NOTE — Telephone Encounter (Signed)
-----   Message from Sindy Guadeloupe, MD sent at 03/11/2019  9:17 AM EST ----- 20 meq PO K for 14 days please. He is running low

## 2019-03-12 ENCOUNTER — Encounter: Payer: Self-pay | Admitting: Oncology

## 2019-03-12 ENCOUNTER — Inpatient Hospital Stay: Payer: PRIVATE HEALTH INSURANCE

## 2019-03-12 ENCOUNTER — Other Ambulatory Visit: Payer: Self-pay

## 2019-03-12 VITALS — BP 100/68 | HR 69 | Resp 17

## 2019-03-12 DIAGNOSIS — Z5111 Encounter for antineoplastic chemotherapy: Secondary | ICD-10-CM | POA: Diagnosis not present

## 2019-03-12 DIAGNOSIS — C188 Malignant neoplasm of overlapping sites of colon: Secondary | ICD-10-CM

## 2019-03-12 MED ORDER — SODIUM CHLORIDE 0.9% FLUSH
10.0000 mL | INTRAVENOUS | Status: DC | PRN
  Administered 2019-03-12: 10 mL
  Filled 2019-03-12: qty 10

## 2019-03-12 MED ORDER — HEPARIN SOD (PORK) LOCK FLUSH 100 UNIT/ML IV SOLN
INTRAVENOUS | Status: AC
  Filled 2019-03-12: qty 5

## 2019-03-12 MED ORDER — HEPARIN SOD (PORK) LOCK FLUSH 100 UNIT/ML IV SOLN
500.0000 [IU] | Freq: Once | INTRAVENOUS | Status: AC | PRN
  Administered 2019-03-12: 500 [IU]
  Filled 2019-03-12: qty 5

## 2019-03-12 NOTE — Progress Notes (Signed)
Hematology/Oncology Consult note [facility]  [phone] [phone]  Patient Care Team: Patient, No Pcp Per as PCP - General (General Practice) Clent Jacks, RN as Oncology Nurse Navigator   Name of the patient: Tristan [name]  [id number]  Apr 05, 1980   Date of visit: 03/12/19  Diagnosis- 1.  Sage 3 colon cancer 2.  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 Vicente Males back in July 2018 for abdominal pain.  At that time CT showed inflammation of the sigmoid colon extending into the rectum.  He was given a course of antibiotics and was asked to follow-up with GI.  A colonoscopy was attempted at that time but was very hard to get past the sigmoid colon as it was tight and tortuous.  Rectal biopsies at that time were normal.  CT colonography and barium enema was recommended but patient was lost to follow-up.  He then presented to the emergency room in August 2020 with symptoms of right lower quadrant abdominal pain.  CT showed masslike thickening involving the cecum and the ascending colon the terminal ileum with surrounding inflammatory changes differentials included infectious/inflammatory enterocolitis as well as colonic neoplasm.  Since it was difficult to do a colonoscopy a barium enema was performed on 01/09/2019 which showed a large irregular apple core lesion involving the lower portion of the right colon.  Since it was difficult for the patient to undergo colonoscopy he was referred to Dr. Adora Fridge for definitive surgical management.  Patient underwent right hemicolectomy on 01/30/2019.  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  With invasion of visceral peritoneum with findings of peritonitis  adhesions and abscesses.  Metastatic carcinoma in 2 out of 245 regional lymph nodes.  Margins were negative.  PT4PN1.  Appendix encased in adhesions with adjacent abscess.  MSI testing showed low probability of MSI high. 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  Ki-67 less than 3%  Adjuvant FOLFOX chemotherapy started on 03/10/2019.  Neuroendocrine tumor is being monitored  Interval history-he feels much improved since his prior visit.  He has come off pain medications and does not report any significant abdominal pain.  Bowel movements are regular.  ECOG PS- 1 Pain scale- 0 Opioid associated constipation- no  Review of systems- Review of Systems  Constitutional: Positive for malaise/fatigue. Negative for chills, fever and weight loss.  HENT: Negative for congestion, ear discharge and nosebleeds.   Eyes: Negative for blurred vision.  Respiratory: Negative for cough, hemoptysis, sputum production, shortness of breath and wheezing.   Cardiovascular: Negative for chest pain, palpitations, orthopnea and claudication.  Gastrointestinal: Negative for abdominal pain, blood in stool, constipation, diarrhea, heartburn, melena, nausea and vomiting.  Genitourinary: Negative for dysuria, flank pain, frequency, hematuria and urgency.  Musculoskeletal: Negative for back pain, joint pain and myalgias.  Skin: Negative for rash.  Neurological: Negative for dizziness, tingling, focal weakness, seizures, weakness and headaches.  Endo/Heme/Allergies: Does not bruise/bleed easily.  Psychiatric/Behavioral: Negative for depression and suicidal ideas. The patient does not have insomnia.      No Known Allergies   Past Medical History:  Diagnosis Date   Abdominal pain    Anemia    Asthma    Cancer (HCC)    GERD (gastroesophageal reflux disease)  Nausea    Poor appetite    Weight loss      Past Surgical History:  Procedure Laterality Date    COLONOSCOPY N/A 01/30/2019   Procedure: COLONOSCOPY;  Surgeon: Jules Husbands, MD;  Location: [facility] ORS;  Service: General;  Laterality: N/A;   COLONOSCOPY WITH PROPOFOL N/A 08/16/2016   Procedure: COLONOSCOPY WITH PROPOFOL;  Surgeon: [name], MD;  Location: [facility] ENDOSCOPY;  Service: Endoscopy;  Laterality: N/A;   COLONOSCOPY WITH PROPOFOL N/A 01/30/2019   Procedure: COLONOSCOPY WITH PROPOFOL;  Surgeon: [name], MD;  Location: [facility] ENDOSCOPY;  Service: Gastroenterology;  Laterality: N/A;  TRAVEL CASE TO O.R. - PROCEDURE TO START AT 7:30 AM IN O.R.   LAPAROSCOPIC RIGHT COLECTOMY N/A 01/30/2019   Procedure: LAPAROSCOPIC HAND ASSISTED RIGHT COLECTOMY CONVERTED TO OPEN PROCEDURE;  Surgeon: Jules Husbands, MD;  Location: [facility] ORS;  Service: General;  Laterality: N/A;   NO PAST SURGERIES     PORTACATH PLACEMENT Right 03/04/2019   Procedure: INSERTION PORT-A-CATH;  Surgeon: Jules Husbands, MD;  Location: [facility] ORS;  Service: General;  Laterality: Right;    Social History   Socioeconomic History   Marital status: Single    Spouse name: Not on file   Number of children: Not on file   Years of education: Not on file   Highest education level: Not on file  Occupational History   Not on file  Tobacco Use   Smoking status: Current Every Day Smoker    Packs/day: 0.50    Types: Cigarettes   Smokeless tobacco: Never Used  Substance and Sexual Activity   Alcohol use: Not Currently   Drug use: Yes    Types: Marijuana    Comment: last week   Sexual activity: Yes    Birth control/protection: Condom  Other Topics Concern   Not on file  Social History Narrative   Not on file   Social Determinants of Health   Financial Resource Strain:    Difficulty of Paying Living Expenses: Not on file  Food Insecurity:    Worried About Charity fundraiser in the Last Year: Not on file   [employer] of Food in the Last Year: Not on file  Transportation Needs:    Lack of Transportation  (Medical): Not on file   Lack of Transportation (Non-Medical): Not on file  Physical Activity:    Days of Exercise per Week: Not on file   Minutes of Exercise per Session: Not on file  Stress:    Feeling of Stress : Not on file  Social Connections:    Frequency of Communication with Friends and Family: Not on file   Frequency of Social Gatherings with Friends and Family: Not on file   Attends Religious Services: Not on file   Active Member of Clubs or Organizations: Not on file   Attends Archivist Meetings: Not on file   Marital Status: Not on file  Intimate Partner Violence:    Fear of Current or Ex-Partner: Not on file   Emotionally Abused: Not on file   Physically Abused: Not on file   Sexually Abused: Not on file    Family History  Problem Relation Age of Onset   Diabetes Mother 85       Type 1   Healthy Father    Colon polyps Father    Colon cancer Other 16   Breast cancer Other 8   Ulcerative colitis Paternal Aunt      Current Outpatient Medications:  calcium carbonate (TUMS - DOSED IN MG ELEMENTAL CALCIUM) 500 MG chewable tablet, Chew 2 tablets by mouth 2 (two) times daily as needed for indigestion or heartburn. , Disp: , Rfl:    cyclobenzaprine (FLEXERIL) 5 MG tablet, Take 1 tablet (5 mg total) by mouth 3 (three) times daily as needed for muscle spasms., Disp: 20 tablet, Rfl: 0   HYDROcodone-acetaminophen (NORCO) 5-325 MG tablet, Take 1-2 tablets by mouth every 6 (six) hours as needed for moderate pain., Disp: 15 tablet, Rfl: 0   loperamide (IMODIUM) 2 MG capsule, Take 2 mg by mouth as needed for diarrhea or loose stools., Disp: , Rfl:    ondansetron (ZOFRAN) 4 MG tablet, Take 1 tablet (4 mg total) by mouth every 8 (eight) hours as needed for nausea or vomiting. (Patient taking differently: Take 4 mg by mouth every 8 (eight) hours as needed for nausea or vomiting. ), Disp: 30 tablet, Rfl: 0   lidocaine-prilocaine (EMLA) cream,  Apply 1 application topically as directed. Apply small amt over port site 1 hour prior to chemo, place saran wrap over the cream to protect clothing, Disp: 5 g, Rfl: 1   potassium chloride SA (KLOR-CON) 20 MEQ tablet, Take 1 tablet (20 mEq total) by mouth daily., Disp: 14 tablet, Rfl: 0   prochlorperazine (COMPAZINE) 10 MG tablet, Take 1 tablet (10 mg total) by mouth every 6 (six) hours as needed for nausea or vomiting., Disp: 30 tablet, Rfl: 1 No current facility-administered medications for this visit.  Facility-Administered Medications Ordered in Other Visits:    sodium chloride flush (NS) 0.9 % injection 10 mL, 10 mL, Intracatheter, PRN, Sindy Guadeloupe, MD, 10 mL at 03/12/19 1308  Physical exam:  Vitals:   03/10/19 0921 03/10/19 0932  BP:  132/83  Pulse:  76  Resp:  16  Temp:  (!) 96.4 F (35.8 C)  TempSrc:  Tympanic  Weight: 172 lb (78 kg) 172 lb 9.6 oz (78.3 kg)  Height: '6\' 1"'  (1.854 m) '6\' 1"'  (1.854 m)   Physical Exam Constitutional:      General: He is not in acute distress. HENT:     Head: Normocephalic and atraumatic.  Eyes:     Pupils: Pupils are equal, round, and reactive to light.  Cardiovascular:     Rate and Rhythm: Normal rate and regular rhythm.     Heart sounds: Normal heart sounds.  Pulmonary:     Effort: Pulmonary effort is normal.     Breath sounds: Normal breath sounds.  Abdominal:     General: Bowel sounds are normal.     Palpations: Abdomen is soft.     Comments: Surgical scar has almost completely closed.  There are no open wound areas.  Some areas along the scar have a serous discharge  Musculoskeletal:     Cervical back: Normal range of motion.  Skin:    General: Skin is warm and dry.  Neurological:     Mental Status: He is alert and oriented to person, place, and time.      CMP Latest Ref Rng & Units 03/10/2019  Glucose 70 - 99 mg/dL 114(H)  BUN 6 - 20 mg/dL 7  Creatinine 0.61 - 1.24 mg/dL 0.67  Sodium 135 - 145 mmol/L 141  Potassium  3.5 - 5.1 mmol/L 2.9(L)  Chloride 98 - 111 mmol/L 106  CO2 22 - 32 mmol/L 26  Calcium 8.9 - 10.3 mg/dL 8.4(L)  Total Protein 6.5 - 8.1 g/dL 6.9  Total Bilirubin 0.3 -  1.2 mg/dL 0.4  Alkaline Phos 38 - 126 U/L 69  AST 15 - 41 U/L 19  ALT 0 - 44 U/L 19   CBC Latest Ref Rng & Units 03/10/2019  WBC 4.0 - 10.5 K/uL 6.6  Hemoglobin 13.0 - 17.0 g/dL 10.7(L)  Hematocrit 39.0 - 52.0 % 35.1(L)  Platelets 150 - 400 K/uL 181       DG Chest 1 View  Result Date: 03/04/2019 CLINICAL DATA:  38 year old male with colon cancer status post Port-A-Cath placement. EXAM: CHEST  1 VIEW COMPARISON:  Chest CT dated 02/23/2019. FINDINGS: Right-sided Port-A-Cath with tip over central SVC. There is no focal consolidation, pleural effusion, or pneumothorax. The cardiac silhouette is within normal limits. No acute osseous pathology. IMPRESSION: Right-sided Port-A-Cath with tip over central SVC. No pneumothorax. Electronically Signed   By: Anner Crete M.D.   On: 03/04/2019 15:13   CT Chest W Contrast  Result Date: 02/23/2019 CLINICAL DATA:  Newly diagnosed right colon carcinoma. Status post right colectomy. Worsening abdominal pain for several days, with nausea, vomiting, and diarrhea. EXAM: CT CHEST, ABDOMEN, AND PELVIS WITH CONTRAST TECHNIQUE: Multidetector CT imaging of the chest, abdomen and pelvis was performed following the standard protocol during bolus administration of intravenous contrast. CONTRAST:  39m OMNIPAQUE IOHEXOL 300 MG/ML  SOLN COMPARISON:  AP CT on 01/21/2019 FINDINGS: CT CHEST FINDINGS Cardiovascular: Small pericardial effusion seen, also visible on prior abdomen CT. Mediastinum/Lymph Nodes: No masses or pathologically enlarged lymph nodes identified. Lungs/Pleura: No pulmonary infiltrate or mass identified. No effusion present. Musculoskeletal:  No suspicious bone lesions identified. CT ABDOMEN AND PELVIS FINDINGS Hepatobiliary: No masses identified. Gallbladder is nearly completely flaps.  No evidence of biliary ductal dilatation Pancreas:  No mass or inflammatory changes. Spleen:  Within normal limits in size and appearance. Adrenals/Urinary tract:  No masses or hydronephrosis. Stomach/Bowel: Patient has undergone right colectomy since previous study. Decreased dilatation of small bowel loops is seen. Mild diffuse small bowel wall thickening is again seen which is greatest involving the distal small bowel in the right and, consistent with enteritis. Diffuse mesenteric soft tissue stranding is again noted with small amount of pelvic ascites, however there is no evidence of abscess. Vascular/Lymphatic: Shotty sub-cm lymph nodes are seen in the small bowel mesentery, similar to previous study. No pathologically enlarged lymph nodes identified. No abdominal aortic aneurysm. Reproductive:  No mass or other significant abnormality identified. Other:  None. Musculoskeletal: No suspicious bone lesions identified. 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 IMPRESSION: 1. Postop changes from right colectomy. Decreased small bowel dilatation since previous study. No evidence of abscess. 2. Persistent diffuse small bowel wall thickening, consistent with enteritis. 3. Diffuse mesenteric edema, and minimal ascites decreased since prior exam. 4. Stable shotty sub-cm lymph nodes in central small bowel mesentery, which are nonspecific. These may be reactive in etiology, however metastatic disease cannot definitely be excluded. 5. Small pericardial effusion, also visible on prior abdomen CT. Electronically Signed   By: JMarlaine HindM.D.   On: 02/23/2019 15:25   CT Abdomen Pelvis W Contrast  Result Date: 02/23/2019 CLINICAL DATA:  Newly diagnosed right colon carcinoma. Status post right colectomy. Worsening abdominal pain for several days, with nausea, vomiting, and diarrhea. EXAM: CT CHEST, ABDOMEN, AND PELVIS WITH CONTRAST TECHNIQUE: Multidetector CT imaging of the chest, abdomen  and pelvis was performed following the standard protocol during bolus administration of intravenous contrast. CONTRAST:  763mOMNIPAQUE IOHEXOL 300 MG/ML  SOLN COMPARISON:  AP CT on 01/21/2019  FINDINGS: CT CHEST FINDINGS Cardiovascular: Small pericardial effusion seen, also visible on prior abdomen CT. Mediastinum/Lymph Nodes: No masses or pathologically enlarged lymph nodes identified. Lungs/Pleura: No pulmonary infiltrate or mass identified. No effusion present. Musculoskeletal:  No suspicious bone lesions identified. CT ABDOMEN AND PELVIS FINDINGS Hepatobiliary: No masses identified. Gallbladder is nearly completely flaps. No evidence of biliary ductal dilatation Pancreas:  No mass or inflammatory changes. Spleen:  Within normal limits in size and appearance. Adrenals/Urinary tract:  No masses or hydronephrosis. Stomach/Bowel: Patient has undergone right colectomy since previous study. Decreased dilatation of small bowel loops is seen. Mild diffuse small bowel wall thickening is again seen which is greatest involving the distal small bowel in the right and, consistent with enteritis. Diffuse mesenteric soft tissue stranding is again noted with small amount of pelvic ascites, however there is no evidence of abscess. Vascular/Lymphatic: Shotty sub-cm lymph nodes are seen in the small bowel mesentery, similar to previous study. No pathologically enlarged lymph nodes identified. No abdominal aortic aneurysm. Reproductive:  No mass or other significant abnormality identified. Other:  None. Musculoskeletal: No suspicious bone lesions identified. 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 IMPRESSION: 1. Postop changes from right colectomy. Decreased small bowel dilatation since previous study. No evidence of abscess. 2. Persistent diffuse small bowel wall thickening, consistent with enteritis. 3. Diffuse mesenteric edema, and minimal ascites decreased since prior exam. 4. Stable shotty  sub-cm lymph nodes in central small bowel mesentery, which are nonspecific. These may be reactive in etiology, however metastatic disease cannot definitely be excluded. 5. Small pericardial effusion, also visible on prior abdomen CT. Electronically Signed   By: Marlaine Hind M.D.   On: 02/23/2019 15:25   DG C-Arm 1-60 Min-No Report  Result Date: 03/04/2019 Fluoroscopy was utilized by the requesting physician.  No radiographic interpretation.     Assessment and plan- Patient is a 38 y.o. male with newly diagnosed adenocarcinoma of the colon at least stage III B cpT4a pN1b cMX and well-differentiated neuroendocrine tumor of the distal ileum stage I PT1PN0.  He is here for on treatment assessment prior to cycle 1 of adjuvant FOLFOX chemotherapy  Counts are okay to proceed with cycle 1 of adjuvant FOLFOX chemotherapy today.  He will come back on day 3 for pump disconnect and I will see him back in 2 weeks time with CBC with differential, CMP for cycle 2.  CEA is currently normal.  Plan is to complete 12 cycles.  Again discussed risks and benefits of chemotherapy including all but not limited to nausea, vomiting, low blood counts, risk of infections and hospitalization.  Risk of peripheral neuropathy associated with oxaliplatin.  Treatment is being given with a curative intent.  Patient understands and agrees to proceed as planned.  Iron deficiency anemia: Hemoglobin is improved from 7.8-10.7 after receiving 2 doses of Feraheme.  Continue to monitor   Visit Diagnosis 1. 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[place])   2. Goals of care, counseling/discussion   3. Encounter for antineoplastic chemotherapy   4. Iron deficiency anemia, unspecified iron deficiency anemia type      Dr. Randa Evens, MD, MPH [facility] [phone] 03/12/2019 2:56 PM

## 2019-03-17 ENCOUNTER — Ambulatory Visit: Payer: Self-pay | Admitting: Gastroenterology

## 2019-03-23 ENCOUNTER — Other Ambulatory Visit: Payer: Self-pay

## 2019-03-23 NOTE — Progress Notes (Signed)
Patient pre screened for office appointment, no questions or concerns today. Patient reminded of upcoming appointment time and date. 

## 2019-03-24 ENCOUNTER — Inpatient Hospital Stay: Payer: PRIVATE HEALTH INSURANCE

## 2019-03-24 ENCOUNTER — Inpatient Hospital Stay (HOSPITAL_BASED_OUTPATIENT_CLINIC_OR_DEPARTMENT_OTHER): Payer: PRIVATE HEALTH INSURANCE | Admitting: Oncology

## 2019-03-24 ENCOUNTER — Encounter: Payer: Self-pay | Admitting: Oncology

## 2019-03-24 ENCOUNTER — Inpatient Hospital Stay: Payer: PRIVATE HEALTH INSURANCE | Attending: Oncology

## 2019-03-24 ENCOUNTER — Other Ambulatory Visit: Payer: Self-pay

## 2019-03-24 VITALS — BP 109/63 | HR 79 | Temp 96.9°F | Ht 73.0 in | Wt 175.0 lb

## 2019-03-24 VITALS — BP 110/70 | HR 60 | Resp 18

## 2019-03-24 DIAGNOSIS — C188 Malignant neoplasm of overlapping sites of colon: Secondary | ICD-10-CM | POA: Diagnosis not present

## 2019-03-24 DIAGNOSIS — D6959 Other secondary thrombocytopenia: Secondary | ICD-10-CM | POA: Diagnosis not present

## 2019-03-24 DIAGNOSIS — K219 Gastro-esophageal reflux disease without esophagitis: Secondary | ICD-10-CM | POA: Diagnosis not present

## 2019-03-24 DIAGNOSIS — Z79899 Other long term (current) drug therapy: Secondary | ICD-10-CM | POA: Insufficient documentation

## 2019-03-24 DIAGNOSIS — D509 Iron deficiency anemia, unspecified: Secondary | ICD-10-CM | POA: Insufficient documentation

## 2019-03-24 DIAGNOSIS — F17210 Nicotine dependence, cigarettes, uncomplicated: Secondary | ICD-10-CM | POA: Diagnosis not present

## 2019-03-24 DIAGNOSIS — R5381 Other malaise: Secondary | ICD-10-CM | POA: Insufficient documentation

## 2019-03-24 DIAGNOSIS — Z5111 Encounter for antineoplastic chemotherapy: Secondary | ICD-10-CM | POA: Insufficient documentation

## 2019-03-24 DIAGNOSIS — R5383 Other fatigue: Secondary | ICD-10-CM | POA: Insufficient documentation

## 2019-03-24 DIAGNOSIS — T451X5A Adverse effect of antineoplastic and immunosuppressive drugs, initial encounter: Secondary | ICD-10-CM | POA: Insufficient documentation

## 2019-03-24 DIAGNOSIS — Z9049 Acquired absence of other specified parts of digestive tract: Secondary | ICD-10-CM | POA: Diagnosis not present

## 2019-03-24 LAB — CBC WITH DIFFERENTIAL/PLATELET
Abs Immature Granulocytes: 0.02 10*3/uL (ref 0.00–0.07)
Basophils Absolute: 0 10*3/uL (ref 0.0–0.1)
Basophils Relative: 1 %
Eosinophils Absolute: 0.3 10*3/uL (ref 0.0–0.5)
Eosinophils Relative: 4 %
HCT: 37.4 % — ABNORMAL LOW (ref 39.0–52.0)
Hemoglobin: 11.4 g/dL — ABNORMAL LOW (ref 13.0–17.0)
Immature Granulocytes: 0 %
Lymphocytes Relative: 29 %
Lymphs Abs: 1.6 10*3/uL (ref 0.7–4.0)
MCH: 27 pg (ref 26.0–34.0)
MCHC: 30.5 g/dL (ref 30.0–36.0)
MCV: 88.4 fL (ref 80.0–100.0)
Monocytes Absolute: 0.7 10*3/uL (ref 0.1–1.0)
Monocytes Relative: 12 %
Neutro Abs: 3 10*3/uL (ref 1.7–7.7)
Neutrophils Relative %: 54 %
Platelets: 152 10*3/uL (ref 150–400)
RBC: 4.23 MIL/uL (ref 4.22–5.81)
RDW: 19.4 % — ABNORMAL HIGH (ref 11.5–15.5)
WBC: 5.6 10*3/uL (ref 4.0–10.5)
nRBC: 0 % (ref 0.0–0.2)

## 2019-03-24 LAB — COMPREHENSIVE METABOLIC PANEL
ALT: 24 U/L (ref 0–44)
AST: 19 U/L (ref 15–41)
Albumin: 3.8 g/dL (ref 3.5–5.0)
Alkaline Phosphatase: 62 U/L (ref 38–126)
Anion gap: 7 (ref 5–15)
BUN: 11 mg/dL (ref 6–20)
CO2: 28 mmol/L (ref 22–32)
Calcium: 8.8 mg/dL — ABNORMAL LOW (ref 8.9–10.3)
Chloride: 105 mmol/L (ref 98–111)
Creatinine, Ser: 0.68 mg/dL (ref 0.61–1.24)
GFR calc Af Amer: >60 mL/min (ref >60)
GFR calc non Af Amer: >60 mL/min (ref >60)
Glucose, Bld: 68 mg/dL — ABNORMAL LOW (ref 70–99)
Potassium: 3.9 mmol/L (ref 3.5–5.1)
Sodium: 140 mmol/L (ref 135–145)
Total Bilirubin: 0.5 mg/dL (ref 0.3–1.2)
Total Protein: 6.7 g/dL (ref 6.5–8.1)

## 2019-03-24 MED ORDER — DEXTROSE 5 % IV SOLN
Freq: Once | INTRAVENOUS | Status: AC
  Filled 2019-03-24: qty 250

## 2019-03-24 MED ORDER — SODIUM CHLORIDE 0.9 % IV SOLN
10.0000 mg | Freq: Once | INTRAVENOUS | Status: AC
  Administered 2019-03-24: 10 mg via INTRAVENOUS
  Filled 2019-03-24: qty 10

## 2019-03-24 MED ORDER — FLUOROURACIL CHEMO INJECTION 2.5 GM/50ML
400.0000 mg/m2 | Freq: Once | INTRAVENOUS | Status: AC
  Administered 2019-03-24: 800 mg via INTRAVENOUS
  Filled 2019-03-24: qty 16

## 2019-03-24 MED ORDER — LEUCOVORIN CALCIUM INJECTION 350 MG
398.0000 mg/m2 | Freq: Once | INTRAVENOUS | Status: AC
  Administered 2019-03-24: 800 mg via INTRAVENOUS
  Filled 2019-03-24: qty 25

## 2019-03-24 MED ORDER — SODIUM CHLORIDE 0.9 % IV SOLN
2400.0000 mg/m2 | INTRAVENOUS | Status: DC
  Administered 2019-03-24: 4800 mg via INTRAVENOUS
  Filled 2019-03-24: qty 96

## 2019-03-24 MED ORDER — PALONOSETRON HCL INJECTION 0.25 MG/5ML
0.2500 mg | Freq: Once | INTRAVENOUS | Status: AC
  Administered 2019-03-24: 0.25 mg via INTRAVENOUS
  Filled 2019-03-24: qty 5

## 2019-03-24 MED ORDER — SODIUM CHLORIDE 0.9% FLUSH
10.0000 mL | Freq: Once | INTRAVENOUS | Status: AC
  Administered 2019-03-24: 10 mL via INTRAVENOUS
  Filled 2019-03-24: qty 10

## 2019-03-24 MED ORDER — OXALIPLATIN CHEMO INJECTION 100 MG/20ML
85.0000 mg/m2 | Freq: Once | INTRAVENOUS | Status: AC
  Administered 2019-03-24: 170 mg via INTRAVENOUS
  Filled 2019-03-24: qty 34

## 2019-03-24 NOTE — Progress Notes (Signed)
1252- Patient getting ready to leave and states, "I feel like something is in my throat. It feels like the same side effects I had last time. I had something to drink a little bit ago. I feel fine other than that." Vitals signs stable, see vital sign flow sheet. MD, Dr. Janese Banks, notified and aware. Per MD order: Patient can be discharged to home at this time. Per MD order: Patient instructed to notify clinic if he experiences any further issues after going home. Patient verbalized understanding.

## 2019-03-24 NOTE — Progress Notes (Signed)
Patient stated that he had been doing well overall. Patient stated that he has had some loose stools but they are better now.

## 2019-03-26 ENCOUNTER — Inpatient Hospital Stay: Payer: PRIVATE HEALTH INSURANCE

## 2019-03-26 ENCOUNTER — Other Ambulatory Visit: Payer: Self-pay

## 2019-03-26 DIAGNOSIS — C188 Malignant neoplasm of overlapping sites of colon: Secondary | ICD-10-CM

## 2019-03-26 DIAGNOSIS — Z5111 Encounter for antineoplastic chemotherapy: Secondary | ICD-10-CM | POA: Diagnosis not present

## 2019-03-26 MED ORDER — SODIUM CHLORIDE 0.9% FLUSH
10.0000 mL | INTRAVENOUS | Status: DC | PRN
  Administered 2019-03-26: 10 mL
  Filled 2019-03-26: qty 10

## 2019-03-26 MED ORDER — HEPARIN SOD (PORK) LOCK FLUSH 100 UNIT/ML IV SOLN
500.0000 [IU] | Freq: Once | INTRAVENOUS | Status: AC | PRN
  Administered 2019-03-26: 500 [IU]
  Filled 2019-03-26: qty 5

## 2019-03-26 NOTE — Progress Notes (Signed)
Hematology/Oncology Consult note [facility]  Telephone:([phone] [phone]  Patient Care Team: Patient, No Pcp Per as PCP - General (General Practice) Clent Jacks, RN as Oncology Nurse Navigator   Name of the patient: Tristan [name]  [id number]  Apr 22, 1980   Date of visit: 03/26/19  Diagnosis-  1.  Stage 3 colon cancer 2.  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 Vicente Males back in July 2018 for abdominal pain. At that time CT showed inflammation of the sigmoid colon extending into the rectum. He was given a course of antibiotics and was asked to follow-up with GI. A colonoscopy was attempted at that time but was very hard to get past the sigmoid colon as it was tight and tortuous. Rectal biopsies at that time were normal. CT colonography and barium enema was recommended but patient was lost to follow-up. He then presented to the emergency room in August 2020 with symptoms of right lower quadrant abdominal pain. CT showed masslike thickening involving the cecum and the ascending colon the terminal ileum with surrounding inflammatory changes differentials included infectious/inflammatory enterocolitis as well as colonic neoplasm. Since it was difficult to do a colonoscopy a barium enema was performed on 01/09/2019 which showed a large irregular apple core lesion involving the lower portion of the right colon. Since it was difficult for the patient to undergo colonoscopy he was referred to Dr. Adora Fridge for definitive surgical management.  Patient underwent right hemicolectomy on 01/30/2019. 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 With invasion of visceral peritoneum with findings of peritonitis adhesions  and abscesses. Metastatic carcinoma in 2 out of 245 regional lymph nodes. Margins were negative. PT4PN1. Appendix encased in adhesions with adjacent abscess. MSI testing showed low probability of MSI high. 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 Ki-67 less than 3%  Adjuvant FOLFOX chemotherapy started on 03/10/2019.  Neuroendocrine tumor is being monitored  Interval history-tolerating chemotherapy well without any significant side effects.  He did report increased sensitivity to cold water on drinking as well as his fingertips which comes and goes.  Denies any nausea or vomiting.  ECOG PS- 1 Pain scale- 0 Opioid associated constipation- no  Review of systems- Review of Systems  Constitutional: Positive for malaise/fatigue. Negative for chills, fever and weight loss.  HENT: Negative for congestion, ear discharge and nosebleeds.   Eyes: Negative for blurred vision.  Respiratory: Negative for cough, hemoptysis, sputum production, shortness of breath and wheezing.   Cardiovascular: Negative for chest pain, palpitations, orthopnea and claudication.  Gastrointestinal: Negative for abdominal pain, blood in stool, constipation, diarrhea, heartburn, melena, nausea and vomiting.  Genitourinary: Negative for dysuria, flank pain, frequency, hematuria and urgency.  Musculoskeletal: Negative for back pain, joint pain and myalgias.  Skin: Negative for rash.  Neurological: Negative for dizziness, tingling, focal weakness, seizures, weakness and headaches.  Endo/Heme/Allergies: Does not bruise/bleed easily.  Psychiatric/Behavioral: Negative for depression and suicidal ideas. The patient does not have insomnia.        No Known Allergies   Past Medical History:  Diagnosis Date  . Abdominal pain   . Anemia   . Asthma   . Cancer ([place])   . GERD (gastroesophageal reflux disease)   . Nausea   . Poor appetite   .  Weight loss      Past Surgical  History:  Procedure Laterality Date  . COLONOSCOPY N/A 01/30/2019   Procedure: COLONOSCOPY;  Surgeon: Jules Husbands, MD;  Location: [facility] ORS;  Service: General;  Laterality: N/A;  . COLONOSCOPY WITH PROPOFOL N/A 08/16/2016   Procedure: COLONOSCOPY WITH PROPOFOL;  Surgeon: [name], MD;  Location: [facility] ENDOSCOPY;  Service: Endoscopy;  Laterality: N/A;  . COLONOSCOPY WITH PROPOFOL N/A 01/30/2019   Procedure: COLONOSCOPY WITH PROPOFOL;  Surgeon: [name], MD;  Location: [facility] ENDOSCOPY;  Service: Gastroenterology;  Laterality: N/A;  TRAVEL CASE TO O.R. - PROCEDURE TO START AT 7:30 AM IN O.R.  . LAPAROSCOPIC RIGHT COLECTOMY N/A 01/30/2019   Procedure: LAPAROSCOPIC HAND ASSISTED RIGHT COLECTOMY CONVERTED TO OPEN PROCEDURE;  Surgeon: Jules Husbands, MD;  Location: [facility] ORS;  Service: General;  Laterality: N/A;  . NO PAST SURGERIES    . PORTACATH PLACEMENT Right 03/04/2019   Procedure: INSERTION PORT-A-CATH;  Surgeon: Jules Husbands, MD;  Location: [facility] ORS;  Service: General;  Laterality: Right;    Social History   Socioeconomic History  . Marital status: Single    Spouse name: Not on file  . Number of children: Not on file  . Years of education: Not on file  . Highest education level: Not on file  Occupational History  . Not on file  Tobacco Use  . Smoking status: Current Every Day Smoker    Packs/day: 0.50    Types: Cigarettes  . Smokeless tobacco: Never Used  Substance and Sexual Activity  . Alcohol use: Not Currently  . Drug use: Yes    Types: Marijuana    Comment: last week  . Sexual activity: Yes    Birth control/protection: Condom  Other Topics Concern  . Not on file  Social History Narrative  . Not on file   Social Determinants of Health   Financial Resource Strain:   . Difficulty of Paying Living Expenses: Not on file  Food Insecurity:   . Worried About Charity fundraiser in the Last Year: Not on file  . Ran Out of Food in the Last Year: Not on file    Transportation Needs:   . Lack of Transportation (Medical): Not on file  . Lack of Transportation (Non-Medical): Not on file  Physical Activity:   . Days of Exercise per Week: Not on file  . Minutes of Exercise per Session: Not on file  Stress:   . Feeling of Stress : Not on file  Social Connections:   . Frequency of Communication with Friends and Family: Not on file  . Frequency of Social Gatherings with Friends and Family: Not on file  . Attends Religious Services: Not on file  . Active Member of Clubs or Organizations: Not on file  . Attends Archivist Meetings: Not on file  . Marital Status: Not on file  Intimate Partner Violence:   . Fear of Current or Ex-Partner: Not on file  . Emotionally Abused: Not on file  . Physically Abused: Not on file  . Sexually Abused: Not on file    Family History  Problem Relation Age of Onset  . Diabetes Mother 70       Type 1  . Healthy Father   . Colon polyps Father   . Colon cancer Other 16  . Breast cancer Other 69  . Ulcerative colitis Paternal Aunt      Current Outpatient Medications:  .  calcium carbonate (TUMS - DOSED  IN MG ELEMENTAL CALCIUM) 500 MG chewable tablet, Chew 2 tablets by mouth 2 (two) times daily as needed for indigestion or heartburn. , Disp: , Rfl:  .  lidocaine-prilocaine (EMLA) cream, Apply 1 application topically as directed. Apply small amt over port site 1 hour prior to chemo, place saran wrap over the cream to protect clothing, Disp: 5 g, Rfl: 1 .  loperamide (IMODIUM) 2 MG capsule, Take 2 mg by mouth as needed for diarrhea or loose stools., Disp: , Rfl:  .  ondansetron (ZOFRAN) 4 MG tablet, Take 1 tablet (4 mg total) by mouth every 8 (eight) hours as needed for nausea or vomiting. (Patient taking differently: Take 4 mg by mouth every 8 (eight) hours as needed for nausea or vomiting. ), Disp: 30 tablet, Rfl: 0 .  potassium chloride SA (KLOR-CON) 20 MEQ tablet, Take 1 tablet (20 mEq total) by mouth  daily., Disp: 14 tablet, Rfl: 0 .  prochlorperazine (COMPAZINE) 10 MG tablet, Take 1 tablet (10 mg total) by mouth every 6 (six) hours as needed for nausea or vomiting., Disp: 30 tablet, Rfl: 1  Physical exam:  Vitals:   03/24/19 0835  BP: 109/63  Pulse: 79  Temp: (!) 96.9 F (36.1 C)  TempSrc: Tympanic  Weight: 175 lb (79.4 kg)  Height: '6\' 1"'  (1.854 m)   Physical Exam Constitutional:      General: He is not in acute distress. HENT:     Head: Normocephalic and atraumatic.  Eyes:     Pupils: Pupils are equal, round, and reactive to light.  Cardiovascular:     Rate and Rhythm: Normal rate and regular rhythm.     Heart sounds: Normal heart sounds.  Pulmonary:     Effort: Pulmonary effort is normal.     Breath sounds: Normal breath sounds.  Abdominal:     General: Bowel sounds are normal.     Palpations: Abdomen is soft.     Comments: Well-healed central surgical scar  Musculoskeletal:     Cervical back: Normal range of motion.  Skin:    General: Skin is warm and dry.  Neurological:     Mental Status: He is alert and oriented to person, place, and time.      CMP Latest Ref Rng & Units 03/24/2019  Glucose 70 - 99 mg/dL 68(L)  BUN 6 - 20 mg/dL 11  Creatinine 0.61 - 1.24 mg/dL 0.68  Sodium 135 - 145 mmol/L 140  Potassium 3.5 - 5.1 mmol/L 3.9  Chloride 98 - 111 mmol/L 105  CO2 22 - 32 mmol/L 28  Calcium 8.9 - 10.3 mg/dL 8.8(L)  Total Protein 6.5 - 8.1 g/dL 6.7  Total Bilirubin 0.3 - 1.2 mg/dL 0.5  Alkaline Phos 38 - 126 U/L 62  AST 15 - 41 U/L 19  ALT 0 - 44 U/L 24   CBC Latest Ref Rng & Units 03/24/2019  WBC 4.0 - 10.5 K/uL 5.6  Hemoglobin 13.0 - 17.0 g/dL 11.4(L)  Hematocrit 39.0 - 52.0 % 37.4(L)  Platelets 150 - 400 K/uL 152    No images are attached to the encounter.  DG Chest 1 View  Result Date: 03/04/2019 CLINICAL DATA:  39 year old male with colon cancer status post Port-A-Cath placement. EXAM: CHEST  1 VIEW COMPARISON:  Chest CT dated 02/23/2019.  FINDINGS: Right-sided Port-A-Cath with tip over central SVC. There is no focal consolidation, pleural effusion, or pneumothorax. The cardiac silhouette is within normal limits. No acute osseous pathology. IMPRESSION: Right-sided Port-A-Cath with tip over  central SVC. No pneumothorax. Electronically Signed   By: Anner Crete M.D.   On: 03/04/2019 15:13   DG C-Arm 1-60 Min-No Report  Result Date: 03/04/2019 Fluoroscopy was utilized by the requesting physician.  No radiographic interpretation.     Assessment and plan- Patient is a 39 y.o. 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  He is here for on treatment assessment prior to cycle 2 of adjuvant FOLFOX chemotherapy  I personally reviewed his labs today and counts are otherwise okay to proceed with cycle 2 of adjuvant FOLFOX chemotherapy I will see him back in 2 weeks time with CBC with differential, CMP for cycle 3.  Plan is to complete 12 cycles.  Iron deficiency anemia: Improved after 2 doses of Feraheme.  Presently hemoglobin 11.4.  Continue to monitor   Visit Diagnosis 1. Encounter for antineoplastic chemotherapy   2. 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[place])   3. Iron deficiency anemia, unspecified iron deficiency anemia type      Dr. Randa Evens, MD, MPH [facility] [phone] 03/26/2019 11:02 AM

## 2019-03-27 ENCOUNTER — Telehealth: Payer: Self-pay | Admitting: *Deleted

## 2019-03-27 NOTE — Telephone Encounter (Signed)
rcvd form for FMLA, called pt to ask if continuous of intermittent. Pt called back and said continuous. I asked him about 4 week s after to recuperate from treatment and he asked for 2 weeks after due to he needs to go back. Will fill out forms

## 2019-03-30 ENCOUNTER — Telehealth: Payer: Self-pay

## 2019-03-30 NOTE — Telephone Encounter (Signed)
Patient's FMLA forms are completed and faxed.

## 2019-04-06 ENCOUNTER — Other Ambulatory Visit: Payer: Self-pay

## 2019-04-06 NOTE — Progress Notes (Signed)
Patient pre screened for office appointment, no questions or concerns today. Patient reminded of upcoming appointment time and date. 

## 2019-04-07 ENCOUNTER — Inpatient Hospital Stay: Payer: PRIVATE HEALTH INSURANCE

## 2019-04-07 ENCOUNTER — Inpatient Hospital Stay (HOSPITAL_BASED_OUTPATIENT_CLINIC_OR_DEPARTMENT_OTHER): Payer: PRIVATE HEALTH INSURANCE | Admitting: Oncology

## 2019-04-07 ENCOUNTER — Other Ambulatory Visit: Payer: Self-pay

## 2019-04-07 VITALS — BP 104/68 | HR 70 | Temp 97.3°F | Resp 16 | Wt 172.1 lb

## 2019-04-07 DIAGNOSIS — C188 Malignant neoplasm of overlapping sites of colon: Secondary | ICD-10-CM | POA: Diagnosis not present

## 2019-04-07 DIAGNOSIS — Z5111 Encounter for antineoplastic chemotherapy: Secondary | ICD-10-CM

## 2019-04-07 DIAGNOSIS — D509 Iron deficiency anemia, unspecified: Secondary | ICD-10-CM

## 2019-04-07 DIAGNOSIS — D6959 Other secondary thrombocytopenia: Secondary | ICD-10-CM

## 2019-04-07 DIAGNOSIS — T451X5A Adverse effect of antineoplastic and immunosuppressive drugs, initial encounter: Secondary | ICD-10-CM

## 2019-04-07 LAB — COMPREHENSIVE METABOLIC PANEL
ALT: 17 U/L (ref 0–44)
AST: 17 U/L (ref 15–41)
Albumin: 4 g/dL (ref 3.5–5.0)
Alkaline Phosphatase: 70 U/L (ref 38–126)
Anion gap: 7 (ref 5–15)
BUN: 8 mg/dL (ref 6–20)
CO2: 27 mmol/L (ref 22–32)
Calcium: 9 mg/dL (ref 8.9–10.3)
Chloride: 104 mmol/L (ref 98–111)
Creatinine, Ser: 0.77 mg/dL (ref 0.61–1.24)
GFR calc Af Amer: >60 mL/min (ref >60)
GFR calc non Af Amer: >60 mL/min (ref >60)
Glucose, Bld: 91 mg/dL (ref 70–99)
Potassium: 3.8 mmol/L (ref 3.5–5.1)
Sodium: 138 mmol/L (ref 135–145)
Total Bilirubin: 0.5 mg/dL (ref 0.3–1.2)
Total Protein: 6.7 g/dL (ref 6.5–8.1)

## 2019-04-07 LAB — CBC WITH DIFFERENTIAL/PLATELET
Abs Immature Granulocytes: 0.02 10*3/uL (ref 0.00–0.07)
Basophils Absolute: 0 10*3/uL (ref 0.0–0.1)
Basophils Relative: 0 %
Eosinophils Absolute: 0.3 10*3/uL (ref 0.0–0.5)
Eosinophils Relative: 3 %
HCT: 38.2 % — ABNORMAL LOW (ref 39.0–52.0)
Hemoglobin: 12.1 g/dL — ABNORMAL LOW (ref 13.0–17.0)
Immature Granulocytes: 0 %
Lymphocytes Relative: 20 %
Lymphs Abs: 1.9 10*3/uL (ref 0.7–4.0)
MCH: 27.9 pg (ref 26.0–34.0)
MCHC: 31.7 g/dL (ref 30.0–36.0)
MCV: 88.2 fL (ref 80.0–100.0)
Monocytes Absolute: 0.9 10*3/uL (ref 0.1–1.0)
Monocytes Relative: 10 %
Neutro Abs: 6.1 10*3/uL (ref 1.7–7.7)
Neutrophils Relative %: 67 %
Platelets: 107 10*3/uL — ABNORMAL LOW (ref 150–400)
RBC: 4.33 MIL/uL (ref 4.22–5.81)
RDW: 16.7 % — ABNORMAL HIGH (ref 11.5–15.5)
WBC: 9.2 10*3/uL (ref 4.0–10.5)
nRBC: 0 % (ref 0.0–0.2)

## 2019-04-07 MED ORDER — OXALIPLATIN CHEMO INJECTION 100 MG/20ML
65.0000 mg/m2 | Freq: Once | INTRAVENOUS | Status: AC
  Administered 2019-04-07: 130 mg via INTRAVENOUS
  Filled 2019-04-07: qty 20

## 2019-04-07 MED ORDER — SODIUM CHLORIDE 0.9 % IV SOLN
2400.0000 mg/m2 | INTRAVENOUS | Status: DC
  Administered 2019-04-07: 4800 mg via INTRAVENOUS
  Filled 2019-04-07: qty 96

## 2019-04-07 MED ORDER — DEXTROSE 5 % IV SOLN
Freq: Once | INTRAVENOUS | Status: AC
  Filled 2019-04-07: qty 250

## 2019-04-07 MED ORDER — SODIUM CHLORIDE 0.9% FLUSH
10.0000 mL | Freq: Once | INTRAVENOUS | Status: AC
  Administered 2019-04-07: 10 mL via INTRAVENOUS
  Filled 2019-04-07: qty 10

## 2019-04-07 MED ORDER — PALONOSETRON HCL INJECTION 0.25 MG/5ML
0.2500 mg | Freq: Once | INTRAVENOUS | Status: AC
  Administered 2019-04-07: 0.25 mg via INTRAVENOUS
  Filled 2019-04-07: qty 5

## 2019-04-07 MED ORDER — LEUCOVORIN CALCIUM INJECTION 350 MG
800.0000 mg | Freq: Once | INTRAVENOUS | Status: AC
  Administered 2019-04-07: 800 mg via INTRAVENOUS
  Filled 2019-04-07: qty 40

## 2019-04-07 MED ORDER — FLUOROURACIL CHEMO INJECTION 2.5 GM/50ML
400.0000 mg/m2 | Freq: Once | INTRAVENOUS | Status: AC
  Administered 2019-04-07: 800 mg via INTRAVENOUS
  Filled 2019-04-07: qty 16

## 2019-04-07 MED ORDER — SODIUM CHLORIDE 0.9 % IV SOLN
10.0000 mg | Freq: Once | INTRAVENOUS | Status: AC
  Administered 2019-04-07: 10 mg via INTRAVENOUS
  Filled 2019-04-07: qty 1

## 2019-04-07 NOTE — Progress Notes (Signed)
Sometimes when he takes first bite -he has a lock jaw effect. It is a pain and he has to open his mouth open wide and turns his beck side to side and it goes away. Sometimes 2-3 times a week. This happens

## 2019-04-08 ENCOUNTER — Telehealth: Payer: Self-pay | Admitting: *Deleted

## 2019-04-08 ENCOUNTER — Inpatient Hospital Stay (HOSPITAL_BASED_OUTPATIENT_CLINIC_OR_DEPARTMENT_OTHER): Payer: PRIVATE HEALTH INSURANCE | Admitting: Oncology

## 2019-04-08 ENCOUNTER — Other Ambulatory Visit: Payer: Self-pay

## 2019-04-08 VITALS — BP 109/66 | HR 92 | Temp 98.7°F | Resp 16

## 2019-04-08 DIAGNOSIS — C189 Malignant neoplasm of colon, unspecified: Secondary | ICD-10-CM

## 2019-04-08 NOTE — Progress Notes (Signed)
Pt states that he slept on the side of the port but he made sure that he had enough tubing. He said both sides was not even looking and it was the tegaderm smooth on one side and aire bubble under the other side. He states it is sore to touch where the tubing goes into port rubber part. Not swollen, no fluids coming out around needle and the infusion has not beeped stating there are any problems.

## 2019-04-08 NOTE — Telephone Encounter (Signed)
Patient called reporting that his port looks like it is flipped up, not looking normal and is asking if this is normal. I discussed with Lora Paula, RN infusion charge nurse who recommends he be seen by [facility]. I called patient and he is going to see if he can find a ride to come in for a 1045 appointment. I am awaiting a return call from patient

## 2019-04-09 ENCOUNTER — Encounter: Payer: Self-pay | Admitting: Oncology

## 2019-04-09 ENCOUNTER — Other Ambulatory Visit: Payer: Self-pay

## 2019-04-09 ENCOUNTER — Inpatient Hospital Stay: Payer: PRIVATE HEALTH INSURANCE

## 2019-04-09 VITALS — BP 122/69 | HR 73 | Resp 18

## 2019-04-09 DIAGNOSIS — Z5111 Encounter for antineoplastic chemotherapy: Secondary | ICD-10-CM | POA: Diagnosis not present

## 2019-04-09 MED ORDER — SODIUM CHLORIDE 0.9% FLUSH
10.0000 mL | Freq: Once | INTRAVENOUS | Status: AC
  Administered 2019-04-09: 10 mL via INTRAVENOUS
  Filled 2019-04-09: qty 10

## 2019-04-09 MED ORDER — HEPARIN SOD (PORK) LOCK FLUSH 100 UNIT/ML IV SOLN
500.0000 [IU] | Freq: Once | INTRAVENOUS | Status: AC
  Administered 2019-04-09: 500 [IU] via INTRAVENOUS
  Filled 2019-04-09: qty 5

## 2019-04-09 MED ORDER — HEPARIN SOD (PORK) LOCK FLUSH 100 UNIT/ML IV SOLN
INTRAVENOUS | Status: AC
  Filled 2019-04-09: qty 5

## 2019-04-09 NOTE — Progress Notes (Signed)
Hematology/Oncology Consult note [facility]  Telephone:([phone] Fax:([phone]  Patient Care Team: Patient, No Pcp Per as PCP - General (General Practice) Clent Jacks, RN as Oncology Nurse Navigator   Name of the patient: Tristan [name]  [id number]  26-Dec-1980   Date of visit: 04/09/19  Diagnosis- 1. 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 Vicente Males back in July 2018 for abdominal pain. At that time CT showed inflammation of the sigmoid colon extending into the rectum. He was given a course of antibiotics and was asked to follow-up with GI. A colonoscopy was attempted at that time but was very hard to get past the sigmoid colon as it was tight and tortuous. Rectal biopsies at that time were normal. CT colonography and barium enema was recommended but patient was lost to follow-up. He then presented to the emergency room in August 2020 with symptoms of right lower quadrant abdominal pain. CT showed masslike thickening involving the cecum and the ascending colon the terminal ileum with surrounding inflammatory changes differentials included infectious/inflammatory enterocolitis as well as colonic neoplasm. Since it was difficult to do a colonoscopy a barium enema was performed on 01/09/2019 which showed a large irregular apple core lesion involving the lower portion of the right colon. Since it was difficult for the patient to undergo colonoscopy he was referred to Dr. Adora Fridge for definitive surgical management.  Patient underwent right hemicolectomy on 01/30/2019. 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 With invasion of visceral peritoneum with findings of peritonitis  adhesions and abscesses. Metastatic carcinoma in 2 out of 245 regional lymph nodes. Margins were negative. PT4PN1. Appendix encased in adhesions with adjacent abscess. MSI testing showed low probability of MSI high. 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 Ki-67 less than 3%  Adjuvant FOLFOX chemotherapy started on 03/10/2019. Neuroendocrine tumor is being monitored  Interval history-reports having an intermittent tingling numbness in his hands and feet especially when exposed to cold which comes and goes but has not persisted.  Denies other complaints at this time  ECOG PS- 1 Pain scale- 0 Opioid associated constipation- no  Review of systems- Review of Systems  Constitutional: Negative for chills, fever, malaise/fatigue and weight loss.  HENT: Negative for congestion, ear discharge and nosebleeds.   Eyes: Negative for blurred vision.  Respiratory: Negative for cough, hemoptysis, sputum production, shortness of breath and wheezing.   Cardiovascular: Negative for chest pain, palpitations, orthopnea and claudication.  Gastrointestinal: Negative for abdominal pain, blood in stool, constipation, diarrhea, heartburn, melena, nausea and vomiting.  Genitourinary: Negative for dysuria, flank pain, frequency, hematuria and urgency.  Musculoskeletal: Negative for back pain, joint pain and myalgias.  Skin: Negative for rash.  Neurological: Positive for sensory change (Peripheral neuropathy). Negative for dizziness, tingling, focal weakness, seizures, weakness and headaches.  Endo/Heme/Allergies: Does not bruise/bleed easily.  Psychiatric/Behavioral: Negative for depression and suicidal ideas. The patient does not have insomnia.       No Known Allergies   Past Medical History:  Diagnosis Date  . Abdominal pain   . Anemia   . Asthma   . Cancer ([place])   . GERD (gastroesophageal reflux disease)   . Nausea   . Poor appetite   . Weight loss  Past Surgical History:  Procedure Laterality Date  . COLONOSCOPY N/A 01/30/2019   Procedure: COLONOSCOPY;  Surgeon: Pabon, Diego F, MD;  Location: [facility] ORS;  Service: General;  Laterality: N/A;  . COLONOSCOPY WITH PROPOFOL N/A 08/16/2016   Procedure: COLONOSCOPY WITH PROPOFOL;  Surgeon: Anna, Kiran, MD;  Location: [facility] ENDOSCOPY;  Service: Endoscopy;  Laterality: N/A;  . COLONOSCOPY WITH PROPOFOL N/A 01/30/2019   Procedure: COLONOSCOPY WITH PROPOFOL;  Surgeon: Anna, Kiran, MD;  Location: [facility] ENDOSCOPY;  Service: Gastroenterology;  Laterality: N/A;  TRAVEL CASE TO O.R. - PROCEDURE TO START AT 7:30 AM IN O.R.  . LAPAROSCOPIC RIGHT COLECTOMY N/A 01/30/2019   Procedure: LAPAROSCOPIC HAND ASSISTED RIGHT COLECTOMY CONVERTED TO OPEN PROCEDURE;  Surgeon: Pabon, Diego F, MD;  Location: [facility] ORS;  Service: General;  Laterality: N/A;  . NO PAST SURGERIES    . PORTACATH PLACEMENT Right 03/04/2019   Procedure: INSERTION PORT-A-CATH;  Surgeon: Pabon, Diego F, MD;  Location: [facility] ORS;  Service: General;  Laterality: Right;    Social History   Socioeconomic History  . Marital status: Single    Spouse name: Not on file  . Number of children: Not on file  . Years of education: Not on file  . Highest education level: Not on file  Occupational History  . Not on file  Tobacco Use  . Smoking status: Current Every Day Smoker    Packs/day: 0.50    Types: Cigarettes  . Smokeless tobacco: Never Used  Substance and Sexual Activity  . Alcohol use: Not Currently  . Drug use: Yes    Types: Marijuana    Comment: last week  . Sexual activity: Yes    Birth control/protection: Condom  Other Topics Concern  . Not on file  Social History Narrative  . Not on file   Social Determinants of Health   Financial Resource Strain:   . Difficulty of Paying Living Expenses: Not on file  Food Insecurity:   . Worried About Running Out of Food in the Last Year: Not on file  . Ran Out of Food in the Last Year:  Not on file  Transportation Needs:   . Lack of Transportation (Medical): Not on file  . Lack of Transportation (Non-Medical): Not on file  Physical Activity:   . Days of Exercise per Week: Not on file  . Minutes of Exercise per Session: Not on file  Stress:   . Feeling of Stress : Not on file  Social Connections:   . Frequency of Communication with Friends and Family: Not on file  . Frequency of Social Gatherings with Friends and Family: Not on file  . Attends Religious Services: Not on file  . Active Member of Clubs or Organizations: Not on file  . Attends Club or Organization Meetings: Not on file  . Marital Status: Not on file  Intimate Partner Violence:   . Fear of Current or Ex-Partner: Not on file  . Emotionally Abused: Not on file  . Physically Abused: Not on file  . Sexually Abused: Not on file    Family History  Problem Relation Age of Onset  . Diabetes Mother 40       Type 1  . Healthy Father   . Colon polyps Father   . Colon cancer Other 70  . Breast cancer Other 70  . Ulcerative colitis Paternal Aunt      Current Outpatient Medications:  .  calcium carbonate (TUMS - DOSED IN MG ELEMENTAL CALCIUM) 500 MG chewable tablet, Chew   2 tablets by mouth 2 (two) times daily as needed for indigestion or heartburn. , Disp: , Rfl:  .  lidocaine-prilocaine (EMLA) cream, Apply 1 application topically as directed. Apply small amt over port site 1 hour prior to chemo, place saran wrap over the cream to protect clothing, Disp: 5 g, Rfl: 1 .  loperamide (IMODIUM) 2 MG capsule, Take 2 mg by mouth as needed for diarrhea or loose stools., Disp: , Rfl:  .  ondansetron (ZOFRAN) 4 MG tablet, Take 1 tablet (4 mg total) by mouth every 8 (eight) hours as needed for nausea or vomiting. (Patient taking differently: Take 4 mg by mouth every 8 (eight) hours as needed for nausea or vomiting. ), Disp: 30 tablet, Rfl: 0 .  potassium chloride SA (KLOR-CON) 20 MEQ tablet, Take 1 tablet (20 mEq total)  by mouth daily., Disp: 14 tablet, Rfl: 0 .  prochlorperazine (COMPAZINE) 10 MG tablet, Take 1 tablet (10 mg total) by mouth every 6 (six) hours as needed for nausea or vomiting., Disp: 30 tablet, Rfl: 1  Physical exam:  Vitals:   04/07/19 0845 04/07/19 0850  BP:  104/68  Pulse:  70  Resp:  16  Temp:  (!) 97.3 F (36.3 C)  TempSrc:  Tympanic  Weight: 172 lb 1.6 oz (78.1 kg) 172 lb 1.6 oz (78.1 kg)   Physical Exam Constitutional:      General: He is not in acute distress. HENT:     Head: Normocephalic and atraumatic.  Eyes:     Pupils: Pupils are equal, round, and reactive to light.  Cardiovascular:     Rate and Rhythm: Normal rate and regular rhythm.     Heart sounds: Normal heart sounds.  Pulmonary:     Effort: Pulmonary effort is normal.     Breath sounds: Normal breath sounds.  Abdominal:     General: Bowel sounds are normal.     Palpations: Abdomen is soft.  Musculoskeletal:     Cervical back: Normal range of motion.  Skin:    General: Skin is warm and dry.  Neurological:     Mental Status: He is alert and oriented to person, place, and time.      CMP Latest Ref Rng & Units 04/07/2019  Glucose 70 - 99 mg/dL 91  BUN 6 - 20 mg/dL 8  Creatinine 0.61 - 1.24 mg/dL 0.77  Sodium 135 - 145 mmol/L 138  Potassium 3.5 - 5.1 mmol/L 3.8  Chloride 98 - 111 mmol/L 104  CO2 22 - 32 mmol/L 27  Calcium 8.9 - 10.3 mg/dL 9.0  Total Protein 6.5 - 8.1 g/dL 6.7  Total Bilirubin 0.3 - 1.2 mg/dL 0.5  Alkaline Phos 38 - 126 U/L 70  AST 15 - 41 U/L 17  ALT 0 - 44 U/L 17   CBC Latest Ref Rng & Units 04/07/2019  WBC 4.0 - 10.5 K/uL 9.2  Hemoglobin 13.0 - 17.0 g/dL 12.1(L)  Hematocrit 39.0 - 52.0 % 38.2(L)  Platelets 150 - 400 K/uL 107(L)      Assessment and plan- Patient is a 38 y.o. 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  I will reduce his oxaliplatin dose to 65 mg per metered squared also given that he   has developed mild grade 1 peripheral neuropathy.  His platelet counts are more than 100 today and therefore he can proceed with cycle 3 of adjuvant FOLFOX chemotherapy today.  I will see him back in 2 weeks time for cycle 4.  If his platelet counts at that time are less than 100, I will plan to drop the bolus 5-FU dose in the future to see if it helps and if it does not we may have to hold oxaliplatin.  Iron deficiency anemia: Status post 2 doses of Feraheme and improved significantly.  Continue to monitor   Visit Diagnosis 1. Encounter for antineoplastic chemotherapy   2. Chemotherapy-induced thrombocytopenia   3. 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[place])   4. Iron deficiency anemia, unspecified iron deficiency anemia type      Dr. Randa Evens, MD, MPH [facility] [id number] 04/09/2019 12:27 PM

## 2019-04-10 NOTE — Progress Notes (Signed)
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[place])  02/09/2019 Cancer Staging   Staging form: Colon and Rectum, AJCC 8th Edition - Pathologic stage from 02/09/2019: Stage IIIB (pT4a, pN1b, cM0) - Signed by Sindy Guadeloupe, MD on 02/10/2019   02/10/2019 Initial Diagnosis   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[place])   03/10/2019 -  Chemotherapy   The patient had palonosetron (ALOXI) injection 0.25 mg, 0.25 mg, Intravenous,  Once, 4 of 12 cycles Administration: 0.25 mg (03/10/2019), 0.25 mg (03/24/2019), 0.25 mg (04/07/2019) leucovorin 800 mg in dextrose 5 % 250 mL infusion, 804 mg, Intravenous,  Once, 4 of 12 cycles Administration: 800 mg (03/10/2019), 800 mg (03/24/2019), 800 mg (04/07/2019) oxaliplatin (ELOXATIN) 170 mg in dextrose 5 % 500 mL chemo infusion, 85 mg/m2 = 170 mg, Intravenous,  Once, 4 of 12 cycles Dose modification: 65 mg/m2 (original dose 85 mg/m2, Cycle 3, Reason: Other (see comments), Comment: thrombocytopenia) Administration: 170 mg (03/10/2019), 170 mg (03/24/2019), 130 mg (04/07/2019) fluorouracil (ADRUCIL) chemo injection 800 mg, 400 mg/m2 = 800 mg, Intravenous,  Once, 3 of 3 cycles Administration: 800 mg (03/10/2019), 800 mg (03/24/2019), 800 mg (04/07/2019) fluorouracil (ADRUCIL) 5,000 mg in sodium chloride 0.9 % 150 mL chemo infusion, 4,800 mg, Intravenous, 1 Day/Dose, 4 of 12 cycles Administration: 5,000 mg (03/10/2019), 4,800 mg (03/24/2019), 4,800 mg (04/07/2019)  for chemotherapy treatment.     Interval history-patient presents  to symptom management today for port discomfort.  Patient states he noticed yesterday after having his port accessed and pump placed, mild swelling and tenderness above his port site.  Patient was recently diagnosed with stage III colon cancer and is status post hemicolectomy on 01/30/2019 and has started treatment with chemotherapy FOLFOX every 2 weeks.  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  First 2 cycles of chemotherapy he denied any port sensitivity or discomfort upon application/removal of the pump.  Patient states the port is tolerable but he wanted to make sure that "something was't wrong".  He states the Tegaderm is "pulling" at his skin as well. He denies any fevers, illness, bleeding, bruising, neurological complaints, chest pain, nausea, vomiting, constipation or diarrhea.   ECOG FS:0 - Asymptomatic  Review of systems- Review of Systems  Constitutional: Negative.  Negative for chills, fever, malaise/fatigue and weight loss.  HENT: Negative for congestion, ear pain and tinnitus.   Eyes: Negative.  Negative for blurred vision and double vision.  Respiratory: Negative.  Negative for cough, sputum production and shortness of breath.   Cardiovascular: Negative.  Negative for chest pain, palpitations and leg swelling.  Gastrointestinal: Negative.  Negative for abdominal pain, constipation, diarrhea, nausea and vomiting.  Genitourinary: Negative for dysuria, frequency and urgency.  Musculoskeletal: Negative for back pain and falls.  Skin: Negative.  Negative for rash.       Right sided port discomfort  Neurological: Negative.  Negative for weakness and headaches.  Endo/Heme/Allergies: Negative.  Does not bruise/bleed easily.  Psychiatric/Behavioral: Negative.  Negative for depression. The patient is not  nervous/anxious and does not have insomnia.      Current treatment- Sp cycle 3 FOLFOX- Next cycle  scheduled for 04/21/19  No Known Allergies   Past Medical History:  Diagnosis Date  . Abdominal pain   . Anemia   . Asthma   . Cancer ([place])   . GERD (gastroesophageal reflux disease)   . Nausea   . Poor appetite   . Weight loss      Past Surgical History:  Procedure Laterality Date  . COLONOSCOPY N/A 01/30/2019   Procedure: COLONOSCOPY;  Surgeon: Jules Husbands, MD;  Location: [facility] ORS;  Service: General;  Laterality: N/A;  . COLONOSCOPY WITH PROPOFOL N/A 08/16/2016   Procedure: COLONOSCOPY WITH PROPOFOL;  Surgeon: [name], MD;  Location: [facility] ENDOSCOPY;  Service: Endoscopy;  Laterality: N/A;  . COLONOSCOPY WITH PROPOFOL N/A 01/30/2019   Procedure: COLONOSCOPY WITH PROPOFOL;  Surgeon: [name], MD;  Location: [facility] ENDOSCOPY;  Service: Gastroenterology;  Laterality: N/A;  TRAVEL CASE TO O.R. - PROCEDURE TO START AT 7:30 AM IN O.R.  . LAPAROSCOPIC RIGHT COLECTOMY N/A 01/30/2019   Procedure: LAPAROSCOPIC HAND ASSISTED RIGHT COLECTOMY CONVERTED TO OPEN PROCEDURE;  Surgeon: Jules Husbands, MD;  Location: [facility] ORS;  Service: General;  Laterality: N/A;  . NO PAST SURGERIES    . PORTACATH PLACEMENT Right 03/04/2019   Procedure: INSERTION PORT-A-CATH;  Surgeon: Jules Husbands, MD;  Location: [facility] ORS;  Service: General;  Laterality: Right;    Social History   Socioeconomic History  . Marital status: Single    Spouse name: Not on file  . Number of children: Not on file  . Years of education: Not on file  . Highest education level: Not on file  Occupational History  . Not on file  Tobacco Use  . Smoking status: Current Every Day Smoker    Packs/day: 0.50    Types: Cigarettes  . Smokeless tobacco: Never Used  Substance and Sexual Activity  . Alcohol use: Not Currently  . Drug use: Yes    Types: Marijuana    Comment: last week  . Sexual activity: Yes    Birth control/protection: Condom  Other Topics Concern  . Not on file  Social History Narrative  . Not on file    Social Determinants of Health   Financial Resource Strain:   . Difficulty of Paying Living Expenses: Not on file  Food Insecurity:   . Worried About Charity fundraiser in the Last Year: Not on file  . Ran Out of Food in the Last Year: Not on file  Transportation Needs:   . Lack of Transportation (Medical): Not on file  . Lack of Transportation (Non-Medical): Not on file  Physical Activity:   . Days of Exercise per Week: Not on file  . Minutes of Exercise per Session: Not on file  Stress:   . Feeling of Stress : Not on file  Social Connections:   . Frequency of Communication with Friends and Family: Not on file  . Frequency of Social Gatherings with Friends and Family: Not on file  . Attends Religious Services: Not on file  . Active Member of Clubs or Organizations: Not on file  . Attends Archivist Meetings: Not on file  . Marital Status: Not on file  Intimate Partner Violence:   . Fear of Current or Ex-Partner: Not on file  . Emotionally Abused: Not on file  . Physically Abused: Not on file  . Sexually Abused: Not on file  Family History  Problem Relation Age of Onset  . Diabetes Mother 7       Type 1  . Healthy Father   . Colon polyps Father   . Colon cancer Other 50  . Breast cancer Other 83  . Ulcerative colitis Paternal Aunt      Current Outpatient Medications:  .  calcium carbonate (TUMS - DOSED IN MG ELEMENTAL CALCIUM) 500 MG chewable tablet, Chew 2 tablets by mouth 2 (two) times daily as needed for indigestion or heartburn. , Disp: , Rfl:  .  lidocaine-prilocaine (EMLA) cream, Apply 1 application topically as directed. Apply small amt over port site 1 hour prior to chemo, place saran wrap over the cream to protect clothing, Disp: 5 g, Rfl: 1 .  loperamide (IMODIUM) 2 MG capsule, Take 2 mg by mouth as needed for diarrhea or loose stools., Disp: , Rfl:  .  ondansetron (ZOFRAN) 4 MG tablet, Take 1 tablet (4 mg total) by mouth every 8 (eight) hours  as needed for nausea or vomiting. (Patient taking differently: Take 4 mg by mouth every 8 (eight) hours as needed for nausea or vomiting. ), Disp: 30 tablet, Rfl: 0 .  potassium chloride SA (KLOR-CON) 20 MEQ tablet, Take 1 tablet (20 mEq total) by mouth daily., Disp: 14 tablet, Rfl: 0 .  prochlorperazine (COMPAZINE) 10 MG tablet, Take 1 tablet (10 mg total) by mouth every 6 (six) hours as needed for nausea or vomiting., Disp: 30 tablet, Rfl: 1 .  alum hydroxide-mag trisilicate (GAVISCON) [id number] MG CHEW chewable tablet, Chew 2 tablets by mouth as needed., Disp: , Rfl:  No current facility-administered medications for this visit.  Facility-Administered Medications Ordered in Other Visits:  .  fluorouracil (ADRUCIL) 4,800 mg in sodium chloride 0.9 % 54 mL chemo infusion, 2,400 mg/m2 (Treatment Plan Recorded), Intravenous, 1 day or 1 dose, Randa Evens C, MD .  leucovorin 800 mg in dextrose 5 % 250 mL infusion, 398 mg/m2 (Treatment Plan Recorded), Intravenous, Once, Sindy Guadeloupe, MD, Last Rate: 145 mL/hr at 04/21/19 1024, 800 mg at 04/21/19 1024 .  oxaliplatin (ELOXATIN) 130 mg in dextrose 5 % 500 mL chemo infusion, 65 mg/m2 (Treatment Plan Recorded), Intravenous, Once, Sindy Guadeloupe, MD, Last Rate: 263 mL/hr at 04/21/19 1023, 130 mg at 04/21/19 1023  Physical exam:  Vitals:   04/08/19 1104  BP: 109/66  Pulse: 92  Resp: 16  Temp: 98.7 F (37.1 C)  TempSrc: Tympanic   Physical Exam Constitutional:      Appearance: Normal appearance.  HENT:     Head: Normocephalic and atraumatic.  Eyes:     Pupils: Pupils are equal, round, and reactive to light.  Cardiovascular:     Rate and Rhythm: Normal rate and regular rhythm.     Heart sounds: Normal heart sounds. No murmur.  Pulmonary:     Effort: Pulmonary effort is normal.     Breath sounds: Normal breath sounds. No wheezing.  Abdominal:     General: Bowel sounds are normal. There is no distension.     Palpations: Abdomen is soft.      Tenderness: There is no abdominal tenderness.  Musculoskeletal:        General: Normal range of motion.     Cervical back: Normal range of motion.  Skin:    General: Skin is warm and dry.     Findings: No rash.     Comments: Port placed and without evidence of infection-no erythema or swelling noted.  Mild tenderness noted with palpation to right subclavian vein  Neurological:     Mental Status: He is alert and oriented to person, place, and time.  Psychiatric:        Judgment: Judgment normal.      CMP Latest Ref Rng & Units 04/21/2019  Glucose 70 - 99 mg/dL 102(H)  BUN 6 - 20 mg/dL 9  Creatinine 0.61 - 1.24 mg/dL 0.89  Sodium 135 - 145 mmol/L 138  Potassium 3.5 - 5.1 mmol/L 3.5  Chloride 98 - 111 mmol/L 103  CO2 22 - 32 mmol/L 24  Calcium 8.9 - 10.3 mg/dL 9.4  Total Protein 6.5 - 8.1 g/dL 7.1  Total Bilirubin 0.3 - 1.2 mg/dL 0.5  Alkaline Phos 38 - 126 U/L 84  AST 15 - 41 U/L 23  ALT 0 - 44 U/L 25   CBC Latest Ref Rng & Units 04/21/2019  WBC 4.0 - 10.5 K/uL 5.9  Hemoglobin 13.0 - 17.0 g/dL 13.0  Hematocrit 39.0 - 52.0 % 40.3  Platelets 150 - 400 K/uL 83(L)    No images are attached to the encounter.  No results found.  Assessment and plan- Patient is a 39 y.o. male who presents for port evaluation.  Stage III colon cancer: Status post 3 cycles FOLFOX every 2 weeks.  He has been doing well with little to no side effects.  He developed mild grade 1 peripheral neuropathy during cycle 2 along with thrombocytopenia.  Plan is to return to clinic in 2 weeks 04/21/2019 for lab work and consideration of cycle 4.  Neuroendocrine tumor: Status post resection.  Currently on surveillance.  Iron deficiency anemia: Status post 2 doses of iron.  Blood counts improved.  Port tenderness: Likely due to chemo.  No obvious signs of infection.  No erythema and no edema.  Mild tenderness noted with palpation of subclavian vein. See plan below   Plan: Flush port. Chemotherapy pump assessed  and patent. Applied new Tegaderm dressing for comfort.  Disposition: RTC as scheduled tomorrow for removal of pump and in 2 weeks for consideration of cycle 4 with labs and assessment with Dr. Janese Banks. Return to clinic if symptoms worsen or if he develops a fever.   Visit Diagnosis 1. Malignant neoplasm of colon, unspecified part of colon [facility])     Patient expressed understanding and was in agreement with this plan. He also understands that He can call clinic at any time with any questions, concerns, or complaints.   Greater than 50% was spent in counseling and coordination of care with this patient including but not limited to discussion of the relevant topics above (See A&P) including, but not limited to diagnosis and management of acute and chronic medical conditions.   Thank you for allowing me to participate in the care of this very pleasant patient.    Jacquelin Hawking, NP [place] at [facility] Cell - [phone] Pager- [phone] 04/21/2019 11:07 AM

## 2019-04-20 ENCOUNTER — Inpatient Hospital Stay: Payer: PRIVATE HEALTH INSURANCE | Admitting: Licensed Clinical Social Worker

## 2019-04-20 ENCOUNTER — Encounter: Payer: PRIVATE HEALTH INSURANCE | Admitting: Licensed Clinical Social Worker

## 2019-04-21 ENCOUNTER — Emergency Department
Admission: EM | Admit: 2019-04-21 | Discharge: 2019-04-21 | Disposition: A | Discharge status: Home / Self Care | Payer: PRIVATE HEALTH INSURANCE | Attending: Emergency Medicine | Admitting: Emergency Medicine

## 2019-04-21 ENCOUNTER — Inpatient Hospital Stay: Payer: PRIVATE HEALTH INSURANCE

## 2019-04-21 ENCOUNTER — Inpatient Hospital Stay (HOSPITAL_BASED_OUTPATIENT_CLINIC_OR_DEPARTMENT_OTHER): Payer: PRIVATE HEALTH INSURANCE | Admitting: Oncology

## 2019-04-21 ENCOUNTER — Other Ambulatory Visit: Payer: Self-pay | Admitting: *Deleted

## 2019-04-21 ENCOUNTER — Inpatient Hospital Stay: Payer: PRIVATE HEALTH INSURANCE | Attending: Oncology

## 2019-04-21 ENCOUNTER — Other Ambulatory Visit: Payer: Self-pay

## 2019-04-21 ENCOUNTER — Encounter: Payer: Self-pay | Admitting: Oncology

## 2019-04-21 VITALS — BP 131/76 | HR 66 | Temp 97.3°F | Wt 167.2 lb

## 2019-04-21 DIAGNOSIS — T451X5A Adverse effect of antineoplastic and immunosuppressive drugs, initial encounter: Secondary | ICD-10-CM | POA: Diagnosis not present

## 2019-04-21 DIAGNOSIS — F1290 Cannabis use, unspecified, uncomplicated: Secondary | ICD-10-CM | POA: Insufficient documentation

## 2019-04-21 DIAGNOSIS — C188 Malignant neoplasm of overlapping sites of colon: Secondary | ICD-10-CM

## 2019-04-21 DIAGNOSIS — Z5111 Encounter for antineoplastic chemotherapy: Secondary | ICD-10-CM

## 2019-04-21 DIAGNOSIS — Y69 Unspecified misadventure during surgical and medical care: Secondary | ICD-10-CM | POA: Insufficient documentation

## 2019-04-21 DIAGNOSIS — D6959 Other secondary thrombocytopenia: Secondary | ICD-10-CM | POA: Diagnosis not present

## 2019-04-21 DIAGNOSIS — R110 Nausea: Secondary | ICD-10-CM | POA: Insufficient documentation

## 2019-04-21 DIAGNOSIS — F17210 Nicotine dependence, cigarettes, uncomplicated: Secondary | ICD-10-CM | POA: Insufficient documentation

## 2019-04-21 DIAGNOSIS — Z9049 Acquired absence of other specified parts of digestive tract: Secondary | ICD-10-CM | POA: Insufficient documentation

## 2019-04-21 DIAGNOSIS — C189 Malignant neoplasm of colon, unspecified: Secondary | ICD-10-CM | POA: Insufficient documentation

## 2019-04-21 DIAGNOSIS — J45909 Unspecified asthma, uncomplicated: Secondary | ICD-10-CM | POA: Diagnosis not present

## 2019-04-21 DIAGNOSIS — T82594A Other mechanical complication of infusion catheter, initial encounter: Secondary | ICD-10-CM | POA: Diagnosis present

## 2019-04-21 DIAGNOSIS — Z451 Encounter for adjustment and management of infusion pump: Secondary | ICD-10-CM

## 2019-04-21 DIAGNOSIS — Z79899 Other long term (current) drug therapy: Secondary | ICD-10-CM | POA: Insufficient documentation

## 2019-04-21 LAB — COMPREHENSIVE METABOLIC PANEL
ALT: 25 U/L (ref 0–44)
AST: 23 U/L (ref 15–41)
Albumin: 4.1 g/dL (ref 3.5–5.0)
Alkaline Phosphatase: 84 U/L (ref 38–126)
Anion gap: 11 (ref 5–15)
BUN: 9 mg/dL (ref 6–20)
CO2: 24 mmol/L (ref 22–32)
Calcium: 9.4 mg/dL (ref 8.9–10.3)
Chloride: 103 mmol/L (ref 98–111)
Creatinine, Ser: 0.89 mg/dL (ref 0.61–1.24)
GFR calc Af Amer: >60 mL/min (ref >60)
GFR calc non Af Amer: >60 mL/min (ref >60)
Glucose, Bld: 102 mg/dL — ABNORMAL HIGH (ref 70–99)
Potassium: 3.5 mmol/L (ref 3.5–5.1)
Sodium: 138 mmol/L (ref 135–145)
Total Bilirubin: 0.5 mg/dL (ref 0.3–1.2)
Total Protein: 7.1 g/dL (ref 6.5–8.1)

## 2019-04-21 LAB — CBC WITH DIFFERENTIAL/PLATELET
Abs Immature Granulocytes: 0.02 10*3/uL (ref 0.00–0.07)
Basophils Absolute: 0.1 10*3/uL (ref 0.0–0.1)
Basophils Relative: 1 %
Eosinophils Absolute: 0.2 10*3/uL (ref 0.0–0.5)
Eosinophils Relative: 4 %
HCT: 40.3 % (ref 39.0–52.0)
Hemoglobin: 13 g/dL (ref 13.0–17.0)
Immature Granulocytes: 0 %
Lymphocytes Relative: 34 %
Lymphs Abs: 2 10*3/uL (ref 0.7–4.0)
MCH: 28.4 pg (ref 26.0–34.0)
MCHC: 32.3 g/dL (ref 30.0–36.0)
MCV: 88.2 fL (ref 80.0–100.0)
Monocytes Absolute: 0.8 10*3/uL (ref 0.1–1.0)
Monocytes Relative: 13 %
Neutro Abs: 2.8 10*3/uL (ref 1.7–7.7)
Neutrophils Relative %: 48 %
Platelets: 83 10*3/uL — ABNORMAL LOW (ref 150–400)
RBC: 4.57 MIL/uL (ref 4.22–5.81)
RDW: 15.9 % — ABNORMAL HIGH (ref 11.5–15.5)
WBC: 5.9 10*3/uL (ref 4.0–10.5)
nRBC: 0 % (ref 0.0–0.2)

## 2019-04-21 MED ORDER — OXALIPLATIN CHEMO INJECTION 100 MG/20ML
65.0000 mg/m2 | Freq: Once | INTRAVENOUS | Status: AC
  Administered 2019-04-21: 130 mg via INTRAVENOUS
  Filled 2019-04-21: qty 20

## 2019-04-21 MED ORDER — LEUCOVORIN CALCIUM INJECTION 350 MG
398.0000 mg/m2 | Freq: Once | INTRAVENOUS | Status: AC
  Administered 2019-04-21: 800 mg via INTRAVENOUS
  Filled 2019-04-21: qty 25

## 2019-04-21 MED ORDER — DEXTROSE 5 % IV SOLN
Freq: Once | INTRAVENOUS | Status: AC
  Filled 2019-04-21: qty 250

## 2019-04-21 MED ORDER — SODIUM CHLORIDE 0.9 % IV SOLN
2400.0000 mg/m2 | INTRAVENOUS | Status: DC
  Administered 2019-04-21: 4800 mg via INTRAVENOUS
  Filled 2019-04-21: qty 96

## 2019-04-21 MED ORDER — OLANZAPINE 10 MG PO TABS: 10.0000 mg | ORAL_TABLET | Freq: Every day | ORAL | 1 refills | Status: DC

## 2019-04-21 MED ORDER — SODIUM CHLORIDE 0.9 % IV SOLN
10.0000 mg | Freq: Once | INTRAVENOUS | Status: AC
  Administered 2019-04-21: 10 mg via INTRAVENOUS
  Filled 2019-04-21: qty 1

## 2019-04-21 MED ORDER — LEUCOVORIN CALCIUM INJECTION 350 MG: 400.0000 mg/m2 | Freq: Once | INTRAVENOUS | Status: DC

## 2019-04-21 MED ORDER — SODIUM CHLORIDE 0.9% FLUSH
10.0000 mL | Freq: Once | INTRAVENOUS | Status: AC
  Administered 2019-04-21: 10 mL via INTRAVENOUS
  Filled 2019-04-21: qty 10

## 2019-04-21 MED ORDER — PALONOSETRON HCL INJECTION 0.25 MG/5ML
0.2500 mg | Freq: Once | INTRAVENOUS | Status: AC
  Administered 2019-04-21: 0.25 mg via INTRAVENOUS
  Filled 2019-04-21: qty 5

## 2019-04-21 NOTE — ED Notes (Signed)
Per instructions from Oncology nurse port access was flushed at both hubs. No resistance met and blood return noted on both. RN unable to restart pump. Pump screen frozen on pump occlusion screen but no longer alarming. Pump will not turn off or switch settings at all. ED charge RN made aware.

## 2019-04-21 NOTE — ED Notes (Signed)
RN called number on side of pump for assistance. Pump restarted and running appropriately at this time. Pt verbalized feeling safe for discharge and follow up with cancer center.

## 2019-04-21 NOTE — Progress Notes (Signed)
Patient stated that he stays nauseated, diarrhea, gassy, no appetite and would like something to help him.

## 2019-04-21 NOTE — ED Triage Notes (Signed)
Pt to the ER for a chemo pump occlusion. Small pump that pt is wearing is alarming and pt states it needs to be flushed. Pt tried to call cancer center but no answer.

## 2019-04-21 NOTE — Discharge Instructions (Signed)
We have performed trouble-shooting on your infusion pump. The issue with the error message appears to be resolved. Follow-up with your Heme/Oncology provider as needed.

## 2019-04-21 NOTE — ED Provider Notes (Signed)
[facility] Emergency Department Provider Note ____________________________________________  Time seen: 2015  I have reviewed the triage vital signs and the nursing notes.  HISTORY  Chief Complaint  chemo pump occlusion  HPI Tristan [name] is a 39 y.o. male presents himself to the ED for management of an error message he has received on his chemo infusion pump. He attempted to contact the on-call provider, who was unreachable through the phone tree.  Patient has no other complaints at this time.  Past Medical History:  Diagnosis Date  . Abdominal pain   . Anemia   . Asthma   . Cancer ([place])   . GERD (gastroesophageal reflux disease)   . Nausea   . Poor appetite   . Weight loss     Patient Active Problem List   Diagnosis Date Noted  . Goals of care, counseling/discussion 02/10/2019  . 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 Iron deficiency anemia 02/10/2019  . Protein-calorie malnutrition, severe 02/01/2019  . Mass of colon 01/30/2019  . Genital warts 07/15/2015    Past Surgical History:  Procedure Laterality Date  . COLONOSCOPY N/A 01/30/2019   Procedure: COLONOSCOPY;  Surgeon: Jules Husbands, MD;  Location: [facility] ORS;  Service: General;  Laterality: N/A;  . COLONOSCOPY WITH PROPOFOL N/A 08/16/2016   Procedure: COLONOSCOPY WITH PROPOFOL;  Surgeon: [name], MD;  Location: [facility] ENDOSCOPY;  Service: Endoscopy;  Laterality: N/A;  . COLONOSCOPY WITH PROPOFOL N/A 01/30/2019   Procedure: COLONOSCOPY WITH PROPOFOL;  Surgeon: [name], MD;  Location: [facility] ENDOSCOPY;  Service: Gastroenterology;  Laterality: N/A;  TRAVEL CASE TO O.R. - PROCEDURE TO START AT 7:30 AM IN O.R.  . LAPAROSCOPIC RIGHT COLECTOMY N/A 01/30/2019   Procedure: LAPAROSCOPIC HAND ASSISTED RIGHT COLECTOMY CONVERTED TO OPEN PROCEDURE;  Surgeon: Jules Husbands, MD;  Location: [facility] ORS;  Service: General;  Laterality: N/A;  . NO PAST SURGERIES    . PORTACATH PLACEMENT  Right 03/04/2019   Procedure: INSERTION PORT-A-CATH;  Surgeon: Jules Husbands, MD;  Location: [facility] ORS;  Service: General;  Laterality: Right;    Prior to Admission medications   Medication Sig Start Date End Date Taking? Authorizing Provider  alum hydroxide-mag trisilicate (GAVISCON) [id number] MG CHEW chewable tablet Chew 2 tablets by mouth as needed.    [provider]  calcium carbonate (TUMS - DOSED IN MG ELEMENTAL CALCIUM) 500 MG chewable tablet Chew 2 tablets by mouth 2 (two) times daily as needed for indigestion or heartburn.     [provider]  lidocaine-prilocaine (EMLA) cream Apply 1 application topically as directed. Apply small amt over port site 1 hour prior to chemo, place saran wrap over the cream to protect clothing 03/10/19   Sindy Guadeloupe, MD  loperamide (IMODIUM) 2 MG capsule Take 2 mg by mouth as needed for diarrhea or loose stools.    [provider]  OLANZapine (ZYPREXA) 10 MG tablet Take 1 tablet (10 mg total) by mouth at bedtime. 04/21/19   Sindy Guadeloupe, MD  ondansetron (ZOFRAN) 4 MG tablet Take 1 tablet (4 mg total) by mouth every 8 (eight) hours as needed for nausea or vomiting. Patient taking differently: Take 4 mg by mouth every 8 (eight) hours as needed for nausea or vomiting.  02/23/19   Pabon, Diego F, MD  potassium chloride SA (KLOR-CON) 20 MEQ tablet Take 1 tablet (20 mEq total) by mouth daily. 03/11/19   Sindy Guadeloupe, MD  prochlorperazine (COMPAZINE) 10 MG tablet Take  1 tablet (10 mg total) by mouth every 6 (six) hours as needed for nausea or vomiting. 03/10/19   Sindy Guadeloupe, MD    Allergies Patient has no known allergies.  Family History  Problem Relation Age of Onset  . Diabetes Mother 33       Type 1  . Healthy Father   . Colon polyps Father   . Colon cancer Other 47  . Breast cancer Other 28  . Ulcerative colitis Paternal Aunt     Social History Social History   Tobacco Use  . Smoking status: Current Every Day  Smoker    Packs/day: 0.50    Types: Cigarettes  . Smokeless tobacco: Never Used  Substance Use Topics  . Alcohol use: Not Currently  . Drug use: Yes    Types: Marijuana    Comment: last week    Review of Systems  Constitutional: Negative for fever. Eyes: Negative for visual changes. ENT: Negative for sore throat. Cardiovascular: Negative for chest pain. Chemo infusion pump error message. Respiratory: Negative for shortness of breath. Musculoskeletal: Negative for back pain. Skin: Negative for rash. Neurological: Negative for headaches, focal weakness or numbness. ____________________________________________  PHYSICAL EXAM:  VITAL SIGNS: ED Triage Vitals  Enc Vitals Group     BP 04/21/19 1955 133/86     Pulse Rate 04/21/19 1955 (!) 105     Resp 04/21/19 1955 18     Temp 04/21/19 1955 98.2 F (36.8 C)     Temp src --      SpO2 04/21/19 1955 95 %     Weight 04/21/19 1956 167 lb 5.1 oz (75.9 kg)     Height 04/21/19 1956 6\' 1"  (1.854 m)     Head Circumference --      Peak Flow --      Pain Score --      Pain Loc --      Pain Edu? --      Excl. in [place]? --     Constitutional: Alert and oriented. Well appearing and in no distress. Head: Normocephalic and atraumatic. Eyes: Conjunctivae are normal. Normal extraocular movements Cardiovascular: Normal rate, regular rhythm. Normal distal pulses. Respiratory: Normal respiratory effort. No wheezes/rales/rhonchi. Musculoskeletal: Nontender with normal range of motion in all extremities.  Neurologic:  Normal gait without ataxia. Normal speech and language. No gross focal neurologic deficits are appreciated. Skin:  Skin is warm, dry and intact. No rash noted. Psychiatric: Mood and affect are normal. Patient exhibits appropriate insight and judgment. ____________________________________________  PROCEDURES  Odis Hollingshead, RN was able to trouble-shoot and correct error message from infusion pump.   Procedures ____________________________________________  INITIAL IMPRESSION / ASSESSMENT AND PLAN / ED COURSE  Patient with ED evaluation of error message from chemo infusion pump. He was unsuccessful in making contact with the on-call provider for Heme/Onc. He presented here for management. We were able to clear any blockage and error messages.   Tristan [name] was evaluated in Emergency Department on 04/22/2019 for the symptoms described in the history of present illness. He was evaluated in the context of the global COVID-19 pandemic, which necessitated consideration that the patient might be at risk for infection with the SARS-CoV-2 virus that causes COVID-19. Institutional protocols and algorithms that pertain to the evaluation of patients at risk for COVID-19 are in a state of rapid change based on information released by regulatory bodies including the CDC and federal and state organizations. These policies and algorithms were followed during the patient's  care in the ED. ____________________________________________  FINAL CLINICAL IMPRESSION(S) / ED DIAGNOSES  Final diagnoses:  Encounter for adjustment and management of infusion pump      Markesha Hannig, Dannielle Karvonen, PA-C 04/22/19 0059    Duffy Bruce, MD 04/24/19 [phone]

## 2019-04-22 ENCOUNTER — Telehealth: Payer: Self-pay | Admitting: *Deleted

## 2019-04-22 NOTE — Telephone Encounter (Signed)
Called pt because he went to ER last night due to pump beeping. ER called the number on the side and they got a person to help them and there was a air bubble in it. It has been fine since then. I reminded him about genetic counseling appt. Tom. He states that he is suppose to come here at [facility] and someone will help him to get on webex for brianna.

## 2019-04-22 NOTE — Telephone Encounter (Signed)
Patient called reporting that his infusion pump was alarming last night and he could not reach the office so he went to ED for assistance. They called the number on the side of the pump and was able to get it fixed and he states that it is currently working. He just wanted to let you know.

## 2019-04-23 ENCOUNTER — Inpatient Hospital Stay: Payer: PRIVATE HEALTH INSURANCE

## 2019-04-23 ENCOUNTER — Encounter: Payer: Self-pay | Admitting: Licensed Clinical Social Worker

## 2019-04-23 ENCOUNTER — Inpatient Hospital Stay (HOSPITAL_BASED_OUTPATIENT_CLINIC_OR_DEPARTMENT_OTHER): Payer: PRIVATE HEALTH INSURANCE | Admitting: Licensed Clinical Social Worker

## 2019-04-23 ENCOUNTER — Other Ambulatory Visit: Payer: Self-pay

## 2019-04-23 VITALS — BP 117/75 | HR 76 | Temp 97.5°F | Resp 18

## 2019-04-23 DIAGNOSIS — Z803 Family history of malignant neoplasm of breast: Secondary | ICD-10-CM | POA: Diagnosis not present

## 2019-04-23 DIAGNOSIS — Z800 Family history of malignant neoplasm of digestive organs: Secondary | ICD-10-CM | POA: Diagnosis not present

## 2019-04-23 DIAGNOSIS — Z9049 Acquired absence of other specified parts of digestive tract: Secondary | ICD-10-CM | POA: Diagnosis not present

## 2019-04-23 DIAGNOSIS — F17210 Nicotine dependence, cigarettes, uncomplicated: Secondary | ICD-10-CM | POA: Diagnosis not present

## 2019-04-23 DIAGNOSIS — F1290 Cannabis use, unspecified, uncomplicated: Secondary | ICD-10-CM | POA: Diagnosis not present

## 2019-04-23 DIAGNOSIS — Z79899 Other long term (current) drug therapy: Secondary | ICD-10-CM | POA: Diagnosis not present

## 2019-04-23 DIAGNOSIS — C188 Malignant neoplasm of overlapping sites of colon: Secondary | ICD-10-CM

## 2019-04-23 DIAGNOSIS — T451X5A Adverse effect of antineoplastic and immunosuppressive drugs, initial encounter: Secondary | ICD-10-CM | POA: Diagnosis not present

## 2019-04-23 DIAGNOSIS — Z1379 Encounter for other screening for genetic and chromosomal anomalies: Secondary | ICD-10-CM | POA: Diagnosis not present

## 2019-04-23 DIAGNOSIS — Z5111 Encounter for antineoplastic chemotherapy: Secondary | ICD-10-CM | POA: Diagnosis not present

## 2019-04-23 DIAGNOSIS — D6959 Other secondary thrombocytopenia: Secondary | ICD-10-CM | POA: Diagnosis not present

## 2019-04-23 DIAGNOSIS — R110 Nausea: Secondary | ICD-10-CM | POA: Diagnosis not present

## 2019-04-23 MED ORDER — HEPARIN SOD (PORK) LOCK FLUSH 100 UNIT/ML IV SOLN
500.0000 [IU] | Freq: Once | INTRAVENOUS | Status: AC | PRN
  Administered 2019-04-23: 500 [IU]
  Filled 2019-04-23: qty 5

## 2019-04-23 MED ORDER — HEPARIN SOD (PORK) LOCK FLUSH 100 UNIT/ML IV SOLN
INTRAVENOUS | Status: AC
  Filled 2019-04-23: qty 5

## 2019-04-23 MED ORDER — SODIUM CHLORIDE 0.9% FLUSH
10.0000 mL | INTRAVENOUS | Status: DC | PRN
  Administered 2019-04-23: 10 mL
  Filled 2019-04-23: qty 10

## 2019-04-23 NOTE — Progress Notes (Signed)
REFERRING PROVIDER: Sindy Guadeloupe, MD [place],  [place] [address]  PRIMARY PROVIDER:  Patient, No Pcp Per  PRIMARY REASON FOR VISIT:  1. Family history of breast cancer   2. Family history of colon cancer    I connected with Tristan [name] on 04/23/2019 at 2:10 PM EDT by Jackquline Denmark and verified that I am speaking with the correct person using two identifiers.    Patient location: Webex at [facility] Provider location: clinic   HISTORY OF PRESENT ILLNESS:   Tristan [name], a 39 y.o. male, was seen for a [place] cancer genetics consultation at the request of Dr. Janese Banks due to a personal and family history of cancer.  Tristan [name] presents to clinic today to discuss the possibility of a hereditary predisposition to cancer, genetic testing, and to further clarify his future cancer risks, as well as potential cancer risks for family members.   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  Treatment has included right colectomy and chemotherapy.   CANCER HISTORY:  Oncology History  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[place])  02/09/2019 Cancer Staging   Staging form: Colon and Rectum, AJCC 8th Edition - Pathologic stage from 02/09/2019: Stage IIIB (pT4a, pN1b, cM0) - Signed by Sindy Guadeloupe, MD on 02/10/2019   02/10/2019 Initial Diagnosis   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[place])   03/10/2019 -  Chemotherapy   The patient had palonosetron (ALOXI) injection 0.25 mg, 0.25 mg, Intravenous,  Once, 4 of 12 cycles Administration: 0.25 mg (03/10/2019), 0.25 mg (03/24/2019), 0.25 mg (04/07/2019), 0.25 mg (04/21/2019) leucovorin 800 mg in dextrose 5 % 250 mL infusion, 804 mg, Intravenous,  Once, 4 of 12 cycles Administration: 800 mg (03/10/2019), 800 mg (03/24/2019), 800 mg (04/07/2019) oxaliplatin (ELOXATIN) 170 mg in dextrose 5 % 500 mL chemo infusion, 85 mg/m2 = 170 mg, Intravenous,  Once, 4 of 12 cycles Dose  modification: 65 mg/m2 (original dose 85 mg/m2, Cycle 3, Reason: Other (see comments), Comment: thrombocytopenia) Administration: 170 mg (03/10/2019), 170 mg (03/24/2019), 130 mg (04/07/2019), 130 mg (04/21/2019) fluorouracil (ADRUCIL) chemo injection 800 mg, 400 mg/m2 = 800 mg, Intravenous,  Once, 3 of 3 cycles Administration: 800 mg (03/10/2019), 800 mg (03/24/2019), 800 mg (04/07/2019) fluorouracil (ADRUCIL) 5,000 mg in sodium chloride 0.9 % 150 mL chemo infusion, 4,800 mg, Intravenous, 1 Day/Dose, 4 of 12 cycles Administration: 5,000 mg (03/10/2019), 4,800 mg (03/24/2019), 4,800 mg (04/07/2019), 4,800 mg (04/21/2019)  for chemotherapy treatment.       Past Medical History:  Diagnosis Date  . Abdominal pain   . Anemia   . Asthma   . Cancer ([place])   . Family history of breast cancer   . Family history of colon cancer   . GERD (gastroesophageal reflux disease)   . Nausea   . Poor appetite   . Weight loss     Past Surgical History:  Procedure Laterality Date  . COLONOSCOPY N/A 01/30/2019   Procedure: COLONOSCOPY;  Surgeon: Jules Husbands, MD;  Location: [facility] ORS;  Service: General;  Laterality: N/A;  . COLONOSCOPY WITH PROPOFOL N/A 08/16/2016   Procedure: COLONOSCOPY WITH PROPOFOL;  Surgeon: [name], MD;  Location: [facility] ENDOSCOPY;  Service: Endoscopy;  Laterality: N/A;  . COLONOSCOPY WITH PROPOFOL N/A 01/30/2019   Procedure: COLONOSCOPY WITH PROPOFOL;  Surgeon: [name], MD;  Location: [facility] ENDOSCOPY;  Service: Gastroenterology;  Laterality: N/A;  TRAVEL CASE TO O.R. -  PROCEDURE TO START AT 7:30 AM IN O.R.  . LAPAROSCOPIC RIGHT COLECTOMY N/A 01/30/2019   Procedure: LAPAROSCOPIC HAND ASSISTED RIGHT COLECTOMY CONVERTED TO OPEN PROCEDURE;  Surgeon: Jules Husbands, MD;  Location: [facility] ORS;  Service: General;  Laterality: N/A;  . NO PAST SURGERIES    . PORTACATH PLACEMENT Right 03/04/2019   Procedure: INSERTION PORT-A-CATH;  Surgeon: Jules Husbands, MD;  Location: [facility] ORS;  Service:  General;  Laterality: Right;    Social History   Socioeconomic History  . Marital status: Single    Spouse name: Not on file  . Number of children: Not on file  . Years of education: Not on file  . Highest education level: Not on file  Occupational History  . Not on file  Tobacco Use  . Smoking status: Current Every Day Smoker    Packs/day: 0.50    Types: Cigarettes  . Smokeless tobacco: Never Used  Substance and Sexual Activity  . Alcohol use: Not Currently  . Drug use: Yes    Types: Marijuana    Comment: last week  . Sexual activity: Yes    Birth control/protection: Condom  Other Topics Concern  . Not on file  Social History Narrative  . Not on file   Social Determinants of Health   Financial Resource Strain:   . Difficulty of Paying Living Expenses: Not on file  Food Insecurity:   . Worried About Charity fundraiser in the Last Year: Not on file  . Ran Out of Food in the Last Year: Not on file  Transportation Needs:   . Lack of Transportation (Medical): Not on file  . Lack of Transportation (Non-Medical): Not on file  Physical Activity:   . Days of Exercise per Week: Not on file  . Minutes of Exercise per Session: Not on file  Stress:   . Feeling of Stress : Not on file  Social Connections:   . Frequency of Communication with Friends and Family: Not on file  . Frequency of Social Gatherings with Friends and Family: Not on file  . Attends Religious Services: Not on file  . Active Member of Clubs or Organizations: Not on file  . Attends Archivist Meetings: Not on file  . Marital Status: Not on file     FAMILY HISTORY:  We obtained a detailed, 4-generation family history.  Significant diagnoses are listed below: Family History  Problem Relation Age of Onset  . Diabetes Mother 31       Type 1  . Breast cancer Mother 82  . Healthy Father   . Colon polyps Father   . Ulcerative colitis Paternal Aunt   . Multiple sclerosis Paternal Aunt   .  Dementia Paternal Uncle   . Breast cancer Maternal Grandmother 75  . Breast cancer Paternal Grandmother 48  . Rectal cancer Maternal Great-grandmother   . Colon cancer Cousin    Tristan [name] has 2 sons, ages 77 and 38. He has 1 sister, age 7, with no history of cancer.  Tristan [name] mother was diagnosed with breast cancer at 4 and is living at 9. Patient has 3 maternal aunts, no cancers. No known cancers in maternal first cousins. Maternal grandmother had breast cancer at 69 and died of metastatic cancer at 24. This individuals mother, patient's great grandmother, had rectal cancer. Patient also has a maternal first cousin once removed who had colon cancer on this same side of the family. Maternal grandfather had a heart attack, no  cancers.  Tristan [name] father is living at 73, he possibly had had colon polyps. Patient has 2 living paternal uncles, no cancers, and 1 deceased paternal aunt, 1 deceased paternal uncle. No known cancers in paternal cousins. Paternal grandmother had breast cancer in her 60s. Paternal grandfather died from the polio vaccine.   Tristan [name] is unaware of previous family history of genetic testing for hereditary cancer risks. Patient's maternal ancestors are of [place] and [place] descent, and paternal ancestors are of [place] and [place] descent. There is no reported Ashkenazi Jewish ancestry. There is no known consanguinity.  GENETIC COUNSELING ASSESSMENT: Tristan [name] is a 39 y.o. male with a personal and family history which is somewhat suggestive of a hereditary cancer syndrome such as Coppolino syndrome and predisposition to cancer. We, therefore, discussed and recommended the following at today's visit.   DISCUSSION: We discussed that 5 - 10% of colon cancer is hereditary, with most cases associated with Sato syndrome. Testing on his tumor did not suggest that this cancer is Haglund-related, however. There are other genes that can be associated with hereditary colon cancer syndromes,  and genes associated with hereditary breast cancer as well. Some increase the risk for both breast and colon, such as CHEK2.  We discussed that testing is beneficial for several reasons including knowing how to follow individuals after completing their treatment, and understand if other family members could be at risk for cancer and allow them to undergo genetic testing.   We reviewed the characteristics, features and inheritance patterns of hereditary cancer syndromes. We also discussed genetic testing, including the appropriate family members to test, the process of testing, insurance coverage and turn-around-time for results. We discussed the implications of a negative, positive and/or variant of uncertain significant result. We recommended Tristan [name] pursue genetic testing for the [facility] Common Hereditary Cancers gene panel.   The Common Hereditary Cancers Panel offered by [facility] includes sequencing and/or deletion duplication testing of the following 48 genes: APC, ATM, AXIN2, BARD1, BMPR1A, BRCA1, BRCA2, BRIP1, CDH1, CDKN2A (p14ARF), CDKN2A (p16INK4a), CKD4, CHEK2, CTNNA1, DICER1, EPCAM (Deletion/duplication testing only), GREM1 (promoter region deletion/duplication testing only), KIT, MEN1, MLH1, MSH2, MSH3, MSH6, MUTYH, NBN, NF1, NHTL1, PALB2, PDGFRA, PMS2, POLD1, POLE, PTEN, RAD50, RAD51C, RAD51D, RNF43, SDHB, SDHC, SDHD, SMAD4, SMARCA4. STK11, TP53, TSC1, TSC2, and VHL.  The following genes were evaluated for sequence changes only: SDHA and HOXB13 c.251G>A variant only.  Based on Tristan [name] personal and family history of cancer, he meets medical criteria for genetic testing. Despite that he meets criteria, he may still have an out of pocket cost.   PLAN: Despite our recommendation, Tristan [name] did not wish to pursue genetic testing at today's visit. He said he is interested and may want to wait until he has Medicaid in place. We understand this decision and remain available to coordinate genetic  testing at any time in the future. We, therefore, recommend Tristan [name] continue to follow the cancer screening guidelines given by his primary healthcare provider.  Lastly, we encouraged Tristan [name] to remain in contact with cancer genetics annually so that we can continuously update the family history and inform him of any changes in cancer genetics and testing that may be of benefit for this family.   Tristan [name] questions were answered to his satisfaction today. Our contact information was provided should additional questions or concerns arise. Thank you for the referral and allowing [place] to share in the care of your patient.   Faith Rogue, MS, [place]  Journalist, [email or web address] Phone: [phone]  The patient was seen for a total of 45 minutes in face-to-face genetic counseling. Patient's maternal aunt Manuela Schwartz was also present.  Drs. Magrinat, Lindi Adie and/or Burr Medico were available for discussion regarding this case.   _______________________________________________________________________ For Office Staff:  Number of people involved in session: 2 Was an Intern/ student involved with case: no

## 2019-04-24 NOTE — Progress Notes (Signed)
Hematology/Oncology Consult note [facility]  Telephone:([phone] Fax:([phone]  Patient Care Team: Patient, No Pcp Per as PCP - General (General Practice) Clent Jacks, RN as Oncology Nurse Navigator   Name of the patient: Tristan [name]  [id number]  03-14-1980   Date of visit: 04/24/19  Diagnosis- 1. 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 Vicente Males back in July 2018 for abdominal pain. At that time CT showed inflammation of the sigmoid colon extending into the rectum. He was given a course of antibiotics and was asked to follow-up with GI. A colonoscopy was attempted at that time but was very hard to get past the sigmoid colon as it was tight and tortuous. Rectal biopsies at that time were normal. CT colonography and barium enema was recommended but patient was lost to follow-up. He then presented to the emergency room in August 2020 with symptoms of right lower quadrant abdominal pain. CT showed masslike thickening involving the cecum and the ascending colon the terminal ileum with surrounding inflammatory changes differentials included infectious/inflammatory enterocolitis as well as colonic neoplasm. Since it was difficult to do a colonoscopy a barium enema was performed on 01/09/2019 which showed a large irregular apple core lesion involving the lower portion of the right colon. Since it was difficult for the patient to undergo colonoscopy he was referred to Dr. Adora Fridge for definitive surgical management.  Patient underwent right hemicolectomy on 01/30/2019. 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 With invasion of visceral peritoneum with findings of peritonitis  adhesions and abscesses. Metastatic carcinoma in 2 out of 245 regional lymph nodes. Margins were negative. PT4PN1. Appendix encased in adhesions with adjacent abscess. MSI testing showed low probability of MSI high. 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 Ki-67 less than 3%  Adjuvant FOLFOX chemotherapy started on 03/10/2019. Neuroendocrine tumor is being monitored   Interval history-patient reports having significant nausea secondary to chemotherapy which is making it difficult to function.  He has tried as needed Compazine and Zofran but has not been helping much.  He reports smoking marijuana which somewhat helps with his symptoms.  However he does not have economic resources to buy more medications.  Reports that he spends close $200 on his marijuana for 10 days but that is only thing which has helped him.  ECOG PS- 1 Pain scale- 0 Opioid associated constipation- no  Review of systems- Review of Systems  Constitutional: Negative for chills, fever, malaise/fatigue and weight loss.  HENT: Negative for congestion, ear discharge and nosebleeds.   Eyes: Negative for blurred vision.  Respiratory: Negative for cough, hemoptysis, sputum production, shortness of breath and wheezing.   Cardiovascular: Negative for chest pain, palpitations, orthopnea and claudication.  Gastrointestinal: Positive for nausea. Negative for abdominal pain, blood in stool, constipation, diarrhea, heartburn, melena and vomiting.  Genitourinary: Negative for dysuria, flank pain, frequency, hematuria and urgency.  Musculoskeletal: Negative for back pain, joint pain and myalgias.  Skin: Negative for rash.  Neurological: Negative for dizziness, tingling, focal weakness, seizures, weakness and headaches.  Endo/Heme/Allergies: Does not bruise/bleed easily.  Psychiatric/Behavioral: Negative for depression and suicidal ideas. The patient does not have insomnia.      No Known  Allergies   Past Medical History:  Diagnosis Date  . Abdominal pain   . Anemia   . Asthma   . Cancer ([place])   . Family history of breast cancer   . Family history of colon cancer   . GERD (gastroesophageal reflux disease)   . Nausea   . Poor appetite   . Weight loss      Past Surgical History:  Procedure Laterality Date  . COLONOSCOPY N/A 01/30/2019   Procedure: COLONOSCOPY;  Surgeon: Jules Husbands, MD;  Location: [facility] ORS;  Service: General;  Laterality: N/A;  . COLONOSCOPY WITH PROPOFOL N/A 08/16/2016   Procedure: COLONOSCOPY WITH PROPOFOL;  Surgeon: [name], MD;  Location: [facility] ENDOSCOPY;  Service: Endoscopy;  Laterality: N/A;  . COLONOSCOPY WITH PROPOFOL N/A 01/30/2019   Procedure: COLONOSCOPY WITH PROPOFOL;  Surgeon: [name], MD;  Location: [facility] ENDOSCOPY;  Service: Gastroenterology;  Laterality: N/A;  TRAVEL CASE TO O.R. - PROCEDURE TO START AT 7:30 AM IN O.R.  . LAPAROSCOPIC RIGHT COLECTOMY N/A 01/30/2019   Procedure: LAPAROSCOPIC HAND ASSISTED RIGHT COLECTOMY CONVERTED TO OPEN PROCEDURE;  Surgeon: Jules Husbands, MD;  Location: [facility] ORS;  Service: General;  Laterality: N/A;  . NO PAST SURGERIES    . PORTACATH PLACEMENT Right 03/04/2019   Procedure: INSERTION PORT-A-CATH;  Surgeon: Jules Husbands, MD;  Location: [facility] ORS;  Service: General;  Laterality: Right;    Social History   Socioeconomic History  . Marital status: Single    Spouse name: Not on file  . Number of children: Not on file  . Years of education: Not on file  . Highest education level: Not on file  Occupational History  . Not on file  Tobacco Use  . Smoking status: Current Every Day Smoker    Packs/day: 0.50    Types: Cigarettes  . Smokeless tobacco: Never Used  Substance and Sexual Activity  . Alcohol use: Not Currently  . Drug use: Yes    Types: Marijuana    Comment: last week  . Sexual activity: Yes    Birth control/protection: Condom  Other Topics Concern  . Not on file  Social  History Narrative  . Not on file   Social Determinants of Health   Financial Resource Strain:   . Difficulty of Paying Living Expenses: Not on file  Food Insecurity:   . Worried About Charity fundraiser in the Last Year: Not on file  . Ran Out of Food in the Last Year: Not on file  Transportation Needs:   . Lack of Transportation (Medical): Not on file  . Lack of Transportation (Non-Medical): Not on file  Physical Activity:   . Days of Exercise per Week: Not on file  . Minutes of Exercise per Session: Not on file  Stress:   . Feeling of Stress : Not on file  Social Connections:   . Frequency of Communication with Friends and Family: Not on file  . Frequency of Social Gatherings with Friends and Family: Not on file  . Attends Religious Services: Not on file  . Active Member of Clubs or Organizations: Not on file  . Attends Archivist Meetings: Not on file  . Marital Status: Not on file  Intimate Partner Violence:   . Fear of Current or Ex-Partner: Not on file  . Emotionally Abused: Not on file  . Physically Abused: Not on file  . Sexually Abused: Not on file    Family History  Problem Relation Age of Onset  . Diabetes Mother 38  Type 1  . Breast cancer Mother 11  . Healthy Father   . Colon polyps Father   . Ulcerative colitis Paternal Aunt   . Multiple sclerosis Paternal Aunt   . Dementia Paternal Uncle   . Breast cancer Maternal Grandmother 63  . Breast cancer Paternal Grandmother 46  . Rectal cancer Maternal Great-grandmother   . Colon cancer Cousin      Current Outpatient Medications:  .  alum hydroxide-mag trisilicate (GAVISCON) 17-79 MG CHEW chewable tablet, Chew 2 tablets by mouth as needed., Disp: , Rfl:  .  calcium carbonate (TUMS - DOSED IN MG ELEMENTAL CALCIUM) 500 MG chewable tablet, Chew 2 tablets by mouth 2 (two) times daily as needed for indigestion or heartburn. , Disp: , Rfl:  .  lidocaine-prilocaine (EMLA) cream, Apply 1 application  topically as directed. Apply small amt over port site 1 hour prior to chemo, place saran wrap over the cream to protect clothing, Disp: 5 g, Rfl: 1 .  loperamide (IMODIUM) 2 MG capsule, Take 2 mg by mouth as needed for diarrhea or loose stools., Disp: , Rfl:  .  ondansetron (ZOFRAN) 4 MG tablet, Take 1 tablet (4 mg total) by mouth every 8 (eight) hours as needed for nausea or vomiting. (Patient taking differently: Take 4 mg by mouth every 8 (eight) hours as needed for nausea or vomiting. ), Disp: 30 tablet, Rfl: 0 .  potassium chloride SA (KLOR-CON) 20 MEQ tablet, Take 1 tablet (20 mEq total) by mouth daily., Disp: 14 tablet, Rfl: 0 .  prochlorperazine (COMPAZINE) 10 MG tablet, Take 1 tablet (10 mg total) by mouth every 6 (six) hours as needed for nausea or vomiting., Disp: 30 tablet, Rfl: 1 .  OLANZapine (ZYPREXA) 10 MG tablet, Take 1 tablet (10 mg total) by mouth at bedtime., Disp: 30 tablet, Rfl: 1  Physical exam:  Vitals:   04/21/19 0847  BP: 131/76  Pulse: 66  Temp: (!) 97.3 F (36.3 C)  TempSrc: Tympanic  Weight: 167 lb 3.2 oz (75.8 kg)   Physical Exam Constitutional:      General: He is not in acute distress. HENT:     Head: Normocephalic and atraumatic.  Eyes:     Pupils: Pupils are equal, round, and reactive to light.  Cardiovascular:     Rate and Rhythm: Normal rate and regular rhythm.     Heart sounds: Normal heart sounds.  Pulmonary:     Effort: Pulmonary effort is normal.     Breath sounds: Normal breath sounds.  Abdominal:     General: Bowel sounds are normal.     Palpations: Abdomen is soft.  Musculoskeletal:     Cervical back: Normal range of motion.  Skin:    General: Skin is warm and dry.  Neurological:     Mental Status: He is alert and oriented to person, place, and time.      CMP Latest Ref Rng & Units 04/21/2019  Glucose 70 - 99 mg/dL 102(H)  BUN 6 - 20 mg/dL 9  Creatinine 0.61 - 1.24 mg/dL 0.89  Sodium 135 - 145 mmol/L 138  Potassium 3.5 - 5.1  mmol/L 3.5  Chloride 98 - 111 mmol/L 103  CO2 22 - 32 mmol/L 24  Calcium 8.9 - 10.3 mg/dL 9.4  Total Protein 6.5 - 8.1 g/dL 7.1  Total Bilirubin 0.3 - 1.2 mg/dL 0.5  Alkaline Phos 38 - 126 U/L 84  AST 15 - 41 U/L 23  ALT 0 - 44 U/L 25  CBC Latest Ref Rng & Units 04/21/2019  WBC 4.0 - 10.5 K/uL 5.9  Hemoglobin 13.0 - 17.0 g/dL 13.0  Hematocrit 39.0 - 52.0 % 40.3  Platelets 150 - 400 K/uL 83(L)      Assessment and plan- Patient is a 39 y.o. 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   He is here for on treatment assessment prior to cycle 4 of adjuvant FOLFOX chemotherapy  At cycle 3 patient's platelet count 170 and dropped his oxaliplatin dose to 65 mg per metered squared.  Today his platelet counts are 83.  Given that they are more than 75 he will still proceed with chemotherapy today but I will drop the 5-FU bolus at this time.  If thrombocytopenia continues to remain a problem he may have to drop his oxaliplatin altogether.  However with stage III disease at such a young age I would like to give oxaliplatin as much as possible  Counts are otherwise okay to proceed with cycle 4 of adjuvant FOLFOX chemotherapy today.  He will come back on day 3 for pump disconnect and I will see him back in 2 weeks for cycle 5  Chemo-induced nausea: I have asked him to try Zyprexa 10 mg nightly which we will try to get it covered under our fund.  If Zyprexa does not help, will consider switching him to Marinol 5 mg twice daily   Visit Diagnosis 1. 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[place])   2. Encounter for antineoplastic chemotherapy   3. Chemotherapy-induced thrombocytopenia      Dr. Randa Evens, MD, MPH [facility] at [facility] [phone] 04/24/2019 8:10 AM

## 2019-05-04 ENCOUNTER — Other Ambulatory Visit: Payer: Self-pay

## 2019-05-04 ENCOUNTER — Encounter: Payer: Self-pay | Admitting: Oncology

## 2019-05-04 NOTE — Progress Notes (Signed)
Patient stated that he had been feeling the same as last time with poor appetite. Patient stated that he would like to speak with the physician.

## 2019-05-05 ENCOUNTER — Other Ambulatory Visit: Payer: Self-pay

## 2019-05-05 ENCOUNTER — Inpatient Hospital Stay: Payer: PRIVATE HEALTH INSURANCE

## 2019-05-05 ENCOUNTER — Inpatient Hospital Stay (HOSPITAL_BASED_OUTPATIENT_CLINIC_OR_DEPARTMENT_OTHER): Payer: PRIVATE HEALTH INSURANCE | Admitting: Oncology

## 2019-05-05 VITALS — BP 119/79 | HR 72 | Temp 96.1°F | Wt 170.5 lb

## 2019-05-05 DIAGNOSIS — Z5111 Encounter for antineoplastic chemotherapy: Secondary | ICD-10-CM | POA: Diagnosis not present

## 2019-05-05 DIAGNOSIS — C188 Malignant neoplasm of overlapping sites of colon: Secondary | ICD-10-CM

## 2019-05-05 DIAGNOSIS — D6959 Other secondary thrombocytopenia: Secondary | ICD-10-CM

## 2019-05-05 DIAGNOSIS — T451X5A Adverse effect of antineoplastic and immunosuppressive drugs, initial encounter: Secondary | ICD-10-CM

## 2019-05-05 DIAGNOSIS — R110 Nausea: Secondary | ICD-10-CM

## 2019-05-05 LAB — CBC WITH DIFFERENTIAL/PLATELET
Abs Immature Granulocytes: 0.02 10*3/uL (ref 0.00–0.07)
Basophils Absolute: 0.1 10*3/uL (ref 0.0–0.1)
Basophils Relative: 1 %
Eosinophils Absolute: 0.2 10*3/uL (ref 0.0–0.5)
Eosinophils Relative: 3 %
HCT: 41.6 % (ref 39.0–52.0)
Hemoglobin: 13.5 g/dL (ref 13.0–17.0)
Immature Granulocytes: 0 %
Lymphocytes Relative: 27 %
Lymphs Abs: 2 10*3/uL (ref 0.7–4.0)
MCH: 29.2 pg (ref 26.0–34.0)
MCHC: 32.5 g/dL (ref 30.0–36.0)
MCV: 90 fL (ref 80.0–100.0)
Monocytes Absolute: 0.8 10*3/uL (ref 0.1–1.0)
Monocytes Relative: 11 %
Neutro Abs: 4.3 10*3/uL (ref 1.7–7.7)
Neutrophils Relative %: 58 %
Platelets: 100 10*3/uL — ABNORMAL LOW (ref 150–400)
RBC: 4.62 MIL/uL (ref 4.22–5.81)
RDW: 16.3 % — ABNORMAL HIGH (ref 11.5–15.5)
WBC: 7.4 10*3/uL (ref 4.0–10.5)
nRBC: 0 % (ref 0.0–0.2)

## 2019-05-05 LAB — COMPREHENSIVE METABOLIC PANEL
ALT: 31 U/L (ref 0–44)
AST: 26 U/L (ref 15–41)
Albumin: 4 g/dL (ref 3.5–5.0)
Alkaline Phosphatase: 77 U/L (ref 38–126)
Anion gap: 8 (ref 5–15)
BUN: 8 mg/dL (ref 6–20)
CO2: 26 mmol/L (ref 22–32)
Calcium: 9.3 mg/dL (ref 8.9–10.3)
Chloride: 104 mmol/L (ref 98–111)
Creatinine, Ser: 0.72 mg/dL (ref 0.61–1.24)
GFR calc Af Amer: >60 mL/min (ref >60)
GFR calc non Af Amer: >60 mL/min (ref >60)
Glucose, Bld: 93 mg/dL (ref 70–99)
Potassium: 4.2 mmol/L (ref 3.5–5.1)
Sodium: 138 mmol/L (ref 135–145)
Total Bilirubin: 0.2 mg/dL — ABNORMAL LOW (ref 0.3–1.2)
Total Protein: 7.2 g/dL (ref 6.5–8.1)

## 2019-05-05 MED ORDER — SODIUM CHLORIDE 0.9 % IV SOLN
10.0000 mg | Freq: Once | INTRAVENOUS | Status: AC
  Administered 2019-05-05: 10 mg via INTRAVENOUS
  Filled 2019-05-05: qty 10

## 2019-05-05 MED ORDER — OXALIPLATIN CHEMO INJECTION 100 MG/20ML
65.0000 mg/m2 | Freq: Once | INTRAVENOUS | Status: AC
  Administered 2019-05-05: 130 mg via INTRAVENOUS
  Filled 2019-05-05: qty 20

## 2019-05-05 MED ORDER — SODIUM CHLORIDE 0.9 % IV SOLN
2400.0000 mg/m2 | INTRAVENOUS | Status: DC
  Administered 2019-05-05: 4800 mg via INTRAVENOUS
  Filled 2019-05-05: qty 96

## 2019-05-05 MED ORDER — PALONOSETRON HCL INJECTION 0.25 MG/5ML
0.2500 mg | Freq: Once | INTRAVENOUS | Status: AC
  Administered 2019-05-05: 0.25 mg via INTRAVENOUS
  Filled 2019-05-05: qty 5

## 2019-05-05 MED ORDER — SODIUM CHLORIDE 0.9% FLUSH
10.0000 mL | Freq: Once | INTRAVENOUS | Status: AC
  Administered 2019-05-05: 10 mL via INTRAVENOUS
  Filled 2019-05-05: qty 10

## 2019-05-05 MED ORDER — DEXTROSE 5 % IV SOLN
Freq: Once | INTRAVENOUS | Status: AC
  Filled 2019-05-05: qty 250

## 2019-05-05 MED ORDER — LEUCOVORIN CALCIUM INJECTION 350 MG
398.0000 mg/m2 | Freq: Once | INTRAVENOUS | Status: AC
  Administered 2019-05-05: 800 mg via INTRAVENOUS
  Filled 2019-05-05: qty 40

## 2019-05-06 ENCOUNTER — Other Ambulatory Visit: Payer: Self-pay | Admitting: *Deleted

## 2019-05-06 ENCOUNTER — Telehealth: Payer: Self-pay | Admitting: *Deleted

## 2019-05-06 MED ORDER — DRONABINOL 5 MG PO CAPS: 5.0000 mg | ORAL_CAPSULE | Freq: Two times a day (BID) | ORAL | 1 refills | Status: DC

## 2019-05-06 NOTE — Telephone Encounter (Signed)
Called pt to let him know that pt states that the zyprexa for him did not work for nausea and dr Janese Banks would like him to try marinol, I spoke to [place] and he said if it was less than 150 dollars then he can get it. I called pharmacy and 2 week supply to try the med to see if it would work is 99 dollars and I called [facility] and they will order the med and it will come intomorrow. Pt. Notified that he can call [facility] tom. Afternoon and get med.he understands

## 2019-05-07 ENCOUNTER — Inpatient Hospital Stay: Payer: PRIVATE HEALTH INSURANCE

## 2019-05-07 ENCOUNTER — Other Ambulatory Visit: Payer: Self-pay | Admitting: Oncology

## 2019-05-07 ENCOUNTER — Telehealth: Payer: Self-pay | Admitting: *Deleted

## 2019-05-07 ENCOUNTER — Other Ambulatory Visit: Payer: Self-pay

## 2019-05-07 VITALS — BP 113/62 | HR 72 | Temp 97.0°F

## 2019-05-07 DIAGNOSIS — C188 Malignant neoplasm of overlapping sites of colon: Secondary | ICD-10-CM

## 2019-05-07 DIAGNOSIS — Z5111 Encounter for antineoplastic chemotherapy: Secondary | ICD-10-CM | POA: Diagnosis not present

## 2019-05-07 MED ORDER — SODIUM CHLORIDE 0.9% FLUSH
10.0000 mL | INTRAVENOUS | Status: DC | PRN
  Administered 2019-05-07: 10 mL
  Filled 2019-05-07: qty 10

## 2019-05-07 MED ORDER — HEPARIN SOD (PORK) LOCK FLUSH 100 UNIT/ML IV SOLN
500.0000 [IU] | Freq: Once | INTRAVENOUS | Status: AC | PRN
  Administered 2019-05-07: 500 [IU]
  Filled 2019-05-07: qty 5

## 2019-05-07 MED ORDER — HEPARIN SOD (PORK) LOCK FLUSH 100 UNIT/ML IV SOLN
INTRAVENOUS | Status: AC
  Filled 2019-05-07: qty 5

## 2019-05-07 MED ORDER — DRONABINOL 5 MG PO CAPS: 5.0000 mg | ORAL_CAPSULE | Freq: Two times a day (BID) | ORAL | 1 refills | Status: DC

## 2019-05-07 NOTE — Telephone Encounter (Signed)
Called pt to let him know that the marinol did not go to right pharmacy. I have got rao to send it to the [facility] and they will have it ready for him tomorrow after 12 noon. Pt agreeable and appreciate all the help the cancer center does for him

## 2019-05-08 NOTE — Progress Notes (Signed)
Hematology/Oncology Consult note [facility]  Telephone:([phone] Fax:([phone]  Patient Care Team: Patient, No Pcp Per as PCP - General (General Practice) Clent Jacks, RN as Oncology Nurse Navigator   Name of the patient: Tristan [name]  [id number]  July 05, 1980   Date of visit: 05/08/19  Diagnosis- 1. 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 Vicente Males back in July 2018 for abdominal pain. At that time CT showed inflammation of the sigmoid colon extending into the rectum. He was given a course of antibiotics and was asked to follow-up with GI. A colonoscopy was attempted at that time but was very hard to get past the sigmoid colon as it was tight and tortuous. Rectal biopsies at that time were normal. CT colonography and barium enema was recommended but patient was lost to follow-up. He then presented to the emergency room in August 2020 with symptoms of right lower quadrant abdominal pain. CT showed masslike thickening involving the cecum and the ascending colon the terminal ileum with surrounding inflammatory changes differentials included infectious/inflammatory enterocolitis as well as colonic neoplasm. Since it was difficult to do a colonoscopy a barium enema was performed on 01/09/2019 which showed a large irregular apple core lesion involving the lower portion of the right colon. Since it was difficult for the patient to undergo colonoscopy he was referred to Dr. Adora Fridge for definitive surgical management.  Patient underwent right hemicolectomy on 01/30/2019. 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 With invasion of visceral peritoneum with findings of peritonitis  adhesions and abscesses. Metastatic carcinoma in 2 out of 245 regional lymph nodes. Margins were negative. PT4PN1. Appendix encased in adhesions with adjacent abscess. MSI testing showed low probability of MSI high. 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 Ki-67 less than 3%  Adjuvant FOLFOX chemotherapy started on 03/10/2019. Neuroendocrine tumor is being monitored  Interval history-patient reports that chemo-induced nausea still not well controlled despite trying Zyprexa.  He has been using occasional marijuana as it is the only thing that helps him.  ECOG PS- 1 Pain scale- 0 Opioid associated constipation- no  Review of systems- Review of Systems  Constitutional: Negative for chills, fever, malaise/fatigue and weight loss.  HENT: Negative for congestion, ear discharge and nosebleeds.   Eyes: Negative for blurred vision.  Respiratory: Negative for cough, hemoptysis, sputum production, shortness of breath and wheezing.   Cardiovascular: Negative for chest pain, palpitations, orthopnea and claudication.  Gastrointestinal: Positive for nausea. Negative for abdominal pain, blood in stool, constipation, diarrhea, heartburn, melena and vomiting.  Genitourinary: Negative for dysuria, flank pain, frequency, hematuria and urgency.  Musculoskeletal: Negative for back pain, joint pain and myalgias.  Skin: Negative for rash.  Neurological: Negative for dizziness, tingling, focal weakness, seizures, weakness and headaches.  Endo/Heme/Allergies: Does not bruise/bleed easily.  Psychiatric/Behavioral: Negative for depression and suicidal ideas. The patient does not have insomnia.      No Known Allergies   Past Medical History:  Diagnosis Date  . Abdominal pain   . Anemia   . Asthma   . Cancer ([place])   . Family history of breast cancer   . Family history of colon cancer   . GERD (gastroesophageal reflux disease)   . Nausea   . Poor  appetite     . Weight loss      Past Surgical History:  Procedure Laterality Date  . COLONOSCOPY N/A 01/30/2019   Procedure: COLONOSCOPY;  Surgeon: Jules Husbands, MD;  Location: [facility] ORS;  Service: General;  Laterality: N/A;  . COLONOSCOPY WITH PROPOFOL N/A 08/16/2016   Procedure: COLONOSCOPY WITH PROPOFOL;  Surgeon: [name], MD;  Location: [facility] ENDOSCOPY;  Service: Endoscopy;  Laterality: N/A;  . COLONOSCOPY WITH PROPOFOL N/A 01/30/2019   Procedure: COLONOSCOPY WITH PROPOFOL;  Surgeon: [name], MD;  Location: [facility] ENDOSCOPY;  Service: Gastroenterology;  Laterality: N/A;  TRAVEL CASE TO O.R. - PROCEDURE TO START AT 7:30 AM IN O.R.  . LAPAROSCOPIC RIGHT COLECTOMY N/A 01/30/2019   Procedure: LAPAROSCOPIC HAND ASSISTED RIGHT COLECTOMY CONVERTED TO OPEN PROCEDURE;  Surgeon: Jules Husbands, MD;  Location: [facility] ORS;  Service: General;  Laterality: N/A;  . NO PAST SURGERIES    . PORTACATH PLACEMENT Right 03/04/2019   Procedure: INSERTION PORT-A-CATH;  Surgeon: Jules Husbands, MD;  Location: [facility] ORS;  Service: General;  Laterality: Right;    Social History   Socioeconomic History  . Marital status: Single    Spouse name: Not on file  . Number of children: Not on file  . Years of education: Not on file  . Highest education level: Not on file  Occupational History  . Not on file  Tobacco Use  . Smoking status: Current Every Day Smoker    Packs/day: 0.50    Types: Cigarettes  . Smokeless tobacco: Never Used  Substance and Sexual Activity  . Alcohol use: Not Currently  . Drug use: Yes    Types: Marijuana    Comment: last week  . Sexual activity: Yes    Birth control/protection: Condom  Other Topics Concern  . Not on file  Social History Narrative  . Not on file   Social Determinants of Health   Financial Resource Strain:   . Difficulty of Paying Living Expenses: Not on file  Food Insecurity:   . Worried About Charity fundraiser in the Last Year: Not on file  . Ran Out of Food in  the Last Year: Not on file  Transportation Needs:   . Lack of Transportation (Medical): Not on file  . Lack of Transportation (Non-Medical): Not on file  Physical Activity:   . Days of Exercise per Week: Not on file  . Minutes of Exercise per Session: Not on file  Stress:   . Feeling of Stress : Not on file  Social Connections:   . Frequency of Communication with Friends and Family: Not on file  . Frequency of Social Gatherings with Friends and Family: Not on file  . Attends Religious Services: Not on file  . Active Member of Clubs or Organizations: Not on file  . Attends Archivist Meetings: Not on file  . Marital Status: Not on file  Intimate Partner Violence:   . Fear of Current or Ex-Partner: Not on file  . Emotionally Abused: Not on file  . Physically Abused: Not on file  . Sexually Abused: Not on file    Family History  Problem Relation Age of Onset  . Diabetes Mother 57       Type 1  . Breast cancer Mother 62  . Healthy Father   . Colon polyps Father   . Ulcerative colitis Paternal Aunt   . Multiple sclerosis Paternal Aunt   . Dementia Paternal Uncle   . Breast cancer  Maternal Grandmother 85  . Breast cancer Paternal Grandmother 85  . Rectal cancer Maternal Great-grandmother   . Colon cancer Cousin      Current Outpatient Medications:  .  alum hydroxide-mag trisilicate (GAVISCON) 00-37 MG CHEW chewable tablet, Chew 2 tablets by mouth as needed., Disp: , Rfl:  .  calcium carbonate (TUMS - DOSED IN MG ELEMENTAL CALCIUM) 500 MG chewable tablet, Chew 2 tablets by mouth 2 (two) times daily as needed for indigestion or heartburn. , Disp: , Rfl:  .  dronabinol (MARINOL) 5 MG capsule, Take 1 capsule (5 mg total) by mouth 2 (two) times daily before a meal., Disp: 28 capsule, Rfl: 1 .  lidocaine-prilocaine (EMLA) cream, Apply 1 application topically as directed. Apply small amt over port site 1 hour prior to chemo, place saran wrap over the cream to protect clothing  (Patient not taking: Reported on 05/04/2019), Disp: 5 g, Rfl: 1 .  ondansetron (ZOFRAN) 4 MG tablet, Take 1 tablet (4 mg total) by mouth every 8 (eight) hours as needed for nausea or vomiting. (Patient not taking: Reported on 05/04/2019), Disp: 30 tablet, Rfl: 0 .  prochlorperazine (COMPAZINE) 10 MG tablet, Take 1 tablet (10 mg total) by mouth every 6 (six) hours as needed for nausea or vomiting. (Patient not taking: Reported on 05/04/2019), Disp: 30 tablet, Rfl: 1  Physical exam:  Vitals:   05/05/19 0914  BP: 119/79  Pulse: 72  Temp: (!) 96.1 F (35.6 C)  TempSrc: Tympanic  Weight: 170 lb 8 oz (77.3 kg)   Physical Exam Constitutional:      General: He is not in acute distress. HENT:     Head: Normocephalic and atraumatic.  Eyes:     Pupils: Pupils are equal, round, and reactive to light.  Cardiovascular:     Rate and Rhythm: Normal rate and regular rhythm.     Heart sounds: Normal heart sounds.  Pulmonary:     Effort: Pulmonary effort is normal.     Breath sounds: Normal breath sounds.  Abdominal:     General: Bowel sounds are normal.     Palpations: Abdomen is soft.  Musculoskeletal:     Cervical back: Normal range of motion.  Skin:    General: Skin is warm and dry.  Neurological:     Mental Status: He is alert and oriented to person, place, and time.      CMP Latest Ref Rng & Units 05/05/2019  Glucose 70 - 99 mg/dL 93  BUN 6 - 20 mg/dL 8  Creatinine 0.61 - 1.24 mg/dL 0.72  Sodium 135 - 145 mmol/L 138  Potassium 3.5 - 5.1 mmol/L 4.2  Chloride 98 - 111 mmol/L 104  CO2 22 - 32 mmol/L 26  Calcium 8.9 - 10.3 mg/dL 9.3  Total Protein 6.5 - 8.1 g/dL 7.2  Total Bilirubin 0.3 - 1.2 mg/dL 0.2(L)  Alkaline Phos 38 - 126 U/L 77  AST 15 - 41 U/L 26  ALT 0 - 44 U/L 31   CBC Latest Ref Rng & Units 05/05/2019  WBC 4.0 - 10.5 K/uL 7.4  Hemoglobin 13.0 - 17.0 g/dL 13.5  Hematocrit 39.0 - 52.0 % 41.6  Platelets 150 - 400 K/uL 100(L)     Assessment and plan- Patient is a 39  y.o. 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  He has been receiving oxaliplatin at reduced dose of 65 mg per metered square and we also dropped his 5-FU bolus due to ongoing chemo-induced thrombocytopenia.  Today his platelet count is greater than 75 and therefore he will proceed with the above regimen today as well.  I will see him back in 2 weeks for cycle 6.  Plan is to complete 12 cycles.  Chemo-induced nausea: I will asked him to stop the Zyprexa since it is not helping him and he would pay for his Marinol 5 mg twice daily through the [employer] fund for the next 3 months.   Visit Diagnosis 1. Encounter for antineoplastic chemotherapy   2. 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[place])   3. Chemotherapy-induced thrombocytopenia   4. Chemotherapy-induced nausea      Dr. Randa Evens, MD, MPH [facility] at [facility] [phone] 05/08/2019 7:57 AM

## 2019-05-19 ENCOUNTER — Encounter: Payer: Self-pay | Admitting: *Deleted

## 2019-05-19 ENCOUNTER — Inpatient Hospital Stay: Payer: PRIVATE HEALTH INSURANCE

## 2019-05-19 ENCOUNTER — Inpatient Hospital Stay: Payer: PRIVATE HEALTH INSURANCE | Attending: Oncology

## 2019-05-19 ENCOUNTER — Encounter: Payer: Self-pay | Admitting: Oncology

## 2019-05-19 ENCOUNTER — Other Ambulatory Visit: Payer: Self-pay

## 2019-05-19 ENCOUNTER — Inpatient Hospital Stay (HOSPITAL_BASED_OUTPATIENT_CLINIC_OR_DEPARTMENT_OTHER): Payer: PRIVATE HEALTH INSURANCE | Admitting: Oncology

## 2019-05-19 VITALS — BP 119/78 | HR 71 | Temp 96.9°F | Ht 73.0 in | Wt 168.2 lb

## 2019-05-19 DIAGNOSIS — C188 Malignant neoplasm of overlapping sites of colon: Secondary | ICD-10-CM

## 2019-05-19 DIAGNOSIS — Z95828 Presence of other vascular implants and grafts: Secondary | ICD-10-CM

## 2019-05-19 DIAGNOSIS — Z5111 Encounter for antineoplastic chemotherapy: Secondary | ICD-10-CM

## 2019-05-19 DIAGNOSIS — R110 Nausea: Secondary | ICD-10-CM

## 2019-05-19 DIAGNOSIS — K659 Peritonitis, unspecified: Secondary | ICD-10-CM | POA: Insufficient documentation

## 2019-05-19 DIAGNOSIS — F17210 Nicotine dependence, cigarettes, uncomplicated: Secondary | ICD-10-CM | POA: Insufficient documentation

## 2019-05-19 DIAGNOSIS — D6959 Other secondary thrombocytopenia: Secondary | ICD-10-CM

## 2019-05-19 DIAGNOSIS — T451X5A Adverse effect of antineoplastic and immunosuppressive drugs, initial encounter: Secondary | ICD-10-CM | POA: Insufficient documentation

## 2019-05-19 DIAGNOSIS — G620 Drug-induced polyneuropathy: Secondary | ICD-10-CM | POA: Diagnosis not present

## 2019-05-19 DIAGNOSIS — Z79899 Other long term (current) drug therapy: Secondary | ICD-10-CM | POA: Insufficient documentation

## 2019-05-19 LAB — CBC WITH DIFFERENTIAL/PLATELET
Abs Immature Granulocytes: 0.02 10*3/uL (ref 0.00–0.07)
Basophils Absolute: 0 10*3/uL (ref 0.0–0.1)
Basophils Relative: 1 %
Eosinophils Absolute: 0.2 10*3/uL (ref 0.0–0.5)
Eosinophils Relative: 4 %
HCT: 43 % (ref 39.0–52.0)
Hemoglobin: 13.9 g/dL (ref 13.0–17.0)
Immature Granulocytes: 0 %
Lymphocytes Relative: 30 %
Lymphs Abs: 1.9 10*3/uL (ref 0.7–4.0)
MCH: 28.5 pg (ref 26.0–34.0)
MCHC: 32.3 g/dL (ref 30.0–36.0)
MCV: 88.3 fL (ref 80.0–100.0)
Monocytes Absolute: 0.8 10*3/uL (ref 0.1–1.0)
Monocytes Relative: 13 %
Neutro Abs: 3.4 10*3/uL (ref 1.7–7.7)
Neutrophils Relative %: 52 %
Platelets: 107 10*3/uL — ABNORMAL LOW (ref 150–400)
RBC: 4.87 MIL/uL (ref 4.22–5.81)
RDW: 16.1 % — ABNORMAL HIGH (ref 11.5–15.5)
WBC: 6.5 10*3/uL (ref 4.0–10.5)
nRBC: 0 % (ref 0.0–0.2)

## 2019-05-19 LAB — COMPREHENSIVE METABOLIC PANEL
ALT: 31 U/L (ref 0–44)
AST: 27 U/L (ref 15–41)
Albumin: 4.2 g/dL (ref 3.5–5.0)
Alkaline Phosphatase: 72 U/L (ref 38–126)
Anion gap: 10 (ref 5–15)
BUN: 6 mg/dL (ref 6–20)
CO2: 26 mmol/L (ref 22–32)
Calcium: 9.1 mg/dL (ref 8.9–10.3)
Chloride: 102 mmol/L (ref 98–111)
Creatinine, Ser: 0.67 mg/dL (ref 0.61–1.24)
GFR calc Af Amer: >60 mL/min (ref >60)
GFR calc non Af Amer: >60 mL/min (ref >60)
Glucose, Bld: 95 mg/dL (ref 70–99)
Potassium: 3.7 mmol/L (ref 3.5–5.1)
Sodium: 138 mmol/L (ref 135–145)
Total Bilirubin: 0.4 mg/dL (ref 0.3–1.2)
Total Protein: 7.1 g/dL (ref 6.5–8.1)

## 2019-05-19 MED ORDER — OXALIPLATIN CHEMO INJECTION 100 MG/20ML
65.0000 mg/m2 | Freq: Once | INTRAVENOUS | Status: AC
  Administered 2019-05-19: 130 mg via INTRAVENOUS
  Filled 2019-05-19: qty 20

## 2019-05-19 MED ORDER — SODIUM CHLORIDE 0.9 % IV SOLN
10.0000 mg | Freq: Once | INTRAVENOUS | Status: AC
  Administered 2019-05-19: 10 mg via INTRAVENOUS
  Filled 2019-05-19: qty 10

## 2019-05-19 MED ORDER — PALONOSETRON HCL INJECTION 0.25 MG/5ML
0.2500 mg | Freq: Once | INTRAVENOUS | Status: AC
  Administered 2019-05-19: 0.25 mg via INTRAVENOUS
  Filled 2019-05-19: qty 5

## 2019-05-19 MED ORDER — SODIUM CHLORIDE 0.9% FLUSH
10.0000 mL | Freq: Once | INTRAVENOUS | Status: AC
  Administered 2019-05-19: 10 mL via INTRAVENOUS
  Filled 2019-05-19: qty 10

## 2019-05-19 MED ORDER — LEUCOVORIN CALCIUM INJECTION 350 MG
800.0000 mg | Freq: Once | INTRAVENOUS | Status: AC
  Administered 2019-05-19: 800 mg via INTRAVENOUS
  Filled 2019-05-19: qty 25

## 2019-05-19 MED ORDER — DEXTROSE 5 % IV SOLN
Freq: Once | INTRAVENOUS | Status: AC
  Filled 2019-05-19: qty 250

## 2019-05-19 MED ORDER — SODIUM CHLORIDE 0.9 % IV SOLN
2400.0000 mg/m2 | INTRAVENOUS | Status: DC
  Administered 2019-05-19: 4800 mg via INTRAVENOUS
  Filled 2019-05-19: qty 96

## 2019-05-19 NOTE — Progress Notes (Signed)
Patient stated that he had been doing better with his pain with Marinol. Patient also stated that he would need a letter for work stating that he is allowed to go back to work full-time.

## 2019-05-21 ENCOUNTER — Inpatient Hospital Stay: Payer: PRIVATE HEALTH INSURANCE

## 2019-05-21 VITALS — BP 120/68 | HR 78 | Temp 97.0°F

## 2019-05-21 DIAGNOSIS — C188 Malignant neoplasm of overlapping sites of colon: Secondary | ICD-10-CM

## 2019-05-21 DIAGNOSIS — Z5111 Encounter for antineoplastic chemotherapy: Secondary | ICD-10-CM | POA: Diagnosis not present

## 2019-05-21 MED ORDER — HEPARIN SOD (PORK) LOCK FLUSH 100 UNIT/ML IV SOLN
500.0000 [IU] | Freq: Once | INTRAVENOUS | Status: AC | PRN
  Administered 2019-05-21: 12:00:00 500 [IU]
  Filled 2019-05-21: qty 5

## 2019-05-25 NOTE — Progress Notes (Signed)
Hematology/Oncology Consult note [facility]  Telephone:([phone] Fax:([phone]  Patient Care Team: Patient, No Pcp Per as PCP - General (General Practice) Clent Jacks, RN as Oncology Nurse Navigator   Name of the patient: Tristan [name]  [id number]  April 19, 1980   Date of visit: 05/25/19  Diagnosis- 1. 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 Vicente Males back in July 2018 for abdominal pain. At that time CT showed inflammation of the sigmoid colon extending into the rectum. He was given a course of antibiotics and was asked to follow-up with GI. A colonoscopy was attempted at that time but was very hard to get past the sigmoid colon as it was tight and tortuous. Rectal biopsies at that time were normal. CT colonography and barium enema was recommended but patient was lost to follow-up. He then presented to the emergency room in August 2020 with symptoms of right lower quadrant abdominal pain. CT showed masslike thickening involving the cecum and the ascending colon the terminal ileum with surrounding inflammatory changes differentials included infectious/inflammatory enterocolitis as well as colonic neoplasm. Since it was difficult to do a colonoscopy a barium enema was performed on 01/09/2019 which showed a large irregular apple core lesion involving the lower portion of the right colon. Since it was difficult for the patient to undergo colonoscopy he was referred to Dr. Adora Fridge for definitive surgical management.  Patient underwent right hemicolectomy on 01/30/2019. 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 With invasion of visceral peritoneum with findings of peritonitis  adhesions and abscesses. Metastatic carcinoma in 2 out of 245 regional lymph nodes. Margins were negative. PT4PN1. Appendix encased in adhesions with adjacent abscess. MSI testing showed low probability of MSI high. 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 Ki-67 less than 3%  Adjuvant FOLFOX chemotherapy started on 03/10/2019. Neuroendocrine tumor is being monitored  Interval history-patient reports nausea is better controlled with Marinol.  Bowel movements have been regular.  Has intermittent tingling numbness in his hands and feet which is now persistent.  ECOG PS- 1Pain scale- 0   Review of systems- Review of Systems  Constitutional: Negative for chills, fever, malaise/fatigue and weight loss.  HENT: Negative for congestion, ear discharge and nosebleeds.   Eyes: Negative for blurred vision.  Respiratory: Negative for cough, hemoptysis, sputum production, shortness of breath and wheezing.   Cardiovascular: Negative for chest pain, palpitations, orthopnea and claudication.  Gastrointestinal: Positive for nausea. Negative for abdominal pain, blood in stool, constipation, diarrhea, heartburn, melena and vomiting.  Genitourinary: Negative for dysuria, flank pain, frequency, hematuria and urgency.  Musculoskeletal: Negative for back pain, joint pain and myalgias.  Skin: Negative for rash.  Neurological: Positive for sensory change (peripheral neuropathy). Negative for dizziness, tingling, focal weakness, seizures, weakness and headaches.  Endo/Heme/Allergies: Does not bruise/bleed easily.  Psychiatric/Behavioral: Negative for depression and suicidal ideas. The patient does not have insomnia.        No Known Allergies   Past Medical History:  Diagnosis Date  . Abdominal pain   . Anemia   . Asthma   . Cancer ([place])   . Family history of breast cancer   . Family history of colon cancer   . GERD (gastroesophageal reflux disease)   .  Nausea   . Poor appetite   . Weight loss      Past Surgical History:  Procedure Laterality Date  . COLONOSCOPY N/A 01/30/2019   Procedure: COLONOSCOPY;  Surgeon: Jules Husbands, MD;  Location: [facility] ORS;  Service: General;  Laterality: N/A;  . COLONOSCOPY WITH PROPOFOL N/A 08/16/2016   Procedure: COLONOSCOPY WITH PROPOFOL;  Surgeon: [name], MD;  Location: [facility] ENDOSCOPY;  Service: Endoscopy;  Laterality: N/A;  . COLONOSCOPY WITH PROPOFOL N/A 01/30/2019   Procedure: COLONOSCOPY WITH PROPOFOL;  Surgeon: [name], MD;  Location: [facility] ENDOSCOPY;  Service: Gastroenterology;  Laterality: N/A;  TRAVEL CASE TO O.R. - PROCEDURE TO START AT 7:30 AM IN O.R.  . LAPAROSCOPIC RIGHT COLECTOMY N/A 01/30/2019   Procedure: LAPAROSCOPIC HAND ASSISTED RIGHT COLECTOMY CONVERTED TO OPEN PROCEDURE;  Surgeon: Jules Husbands, MD;  Location: [facility] ORS;  Service: General;  Laterality: N/A;  . NO PAST SURGERIES    . PORTACATH PLACEMENT Right 03/04/2019   Procedure: INSERTION PORT-A-CATH;  Surgeon: Jules Husbands, MD;  Location: [facility] ORS;  Service: General;  Laterality: Right;    Social History   Socioeconomic History  . Marital status: Single    Spouse name: Not on file  . Number of children: Not on file  . Years of education: Not on file  . Highest education level: Not on file  Occupational History  . Not on file  Tobacco Use  . Smoking status: Current Every Day Smoker    Packs/day: 0.50    Types: Cigarettes  . Smokeless tobacco: Never Used  Substance and Sexual Activity  . Alcohol use: Not Currently  . Drug use: Yes    Types: Marijuana    Comment: last week  . Sexual activity: Yes    Birth control/protection: Condom  Other Topics Concern  . Not on file  Social History Narrative  . Not on file   Social Determinants of Health   Financial Resource Strain:   . Difficulty of Paying Living Expenses:   Food Insecurity:   . Worried About Charity fundraiser in the Last Year:   . Academic librarian in the Last Year:   Transportation Needs:   . Film/video editor (Medical):   Marland Kitchen Lack of Transportation (Non-Medical):   Physical Activity:   . Days of Exercise per Week:   . Minutes of Exercise per Session:   Stress:   . Feeling of Stress :   Social Connections:   . Frequency of Communication with Friends and Family:   . Frequency of Social Gatherings with Friends and Family:   . Attends Religious Services:   . Active Member of Clubs or Organizations:   . Attends Archivist Meetings:   Marland Kitchen Marital Status:   Intimate Partner Violence:   . Fear of Current or Ex-Partner:   . Emotionally Abused:   Marland Kitchen Physically Abused:   . Sexually Abused:     Family History  Problem Relation Age of Onset  . Diabetes Mother 52       Type 1  . Breast cancer Mother 62  . Healthy Father   . Colon polyps Father   . Ulcerative colitis Paternal Aunt   . Multiple sclerosis Paternal Aunt   . Dementia Paternal Uncle   . Breast cancer Maternal Grandmother 17  . Breast cancer Paternal Grandmother 69  . Rectal cancer Maternal Great-grandmother   . Colon cancer Cousin      Current Outpatient Medications:  .  alum hydroxide-mag trisilicate (GAVISCON) 65-68 MG CHEW chewable tablet, Chew 2 tablets by mouth as needed., Disp: , Rfl:  .  calcium carbonate (TUMS - DOSED IN MG ELEMENTAL CALCIUM) 500 MG chewable tablet, Chew 2 tablets by mouth 2 (two) times daily as needed for indigestion or heartburn. , Disp: , Rfl:  .  dronabinol (MARINOL) 5 MG capsule, Take 1 capsule (5 mg total) by mouth 2 (two) times daily before a meal., Disp: 28 capsule, Rfl: 1 .  lidocaine-prilocaine (EMLA) cream, Apply 1 application topically as directed. Apply small amt over port site 1 hour prior to chemo, place saran wrap over the cream to protect clothing, Disp: 5 g, Rfl: 1 .  ondansetron (ZOFRAN) 4 MG tablet, Take 1 tablet (4 mg total) by mouth every 8 (eight) hours as needed for nausea or vomiting., Disp: 30 tablet,  Rfl: 0 .  prochlorperazine (COMPAZINE) 10 MG tablet, Take 1 tablet (10 mg total) by mouth every 6 (six) hours as needed for nausea or vomiting., Disp: 30 tablet, Rfl: 1  Physical exam:  Vitals:   05/19/19 0901  BP: 119/78  Pulse: 71  Temp: (!) 96.9 F (36.1 C)  TempSrc: Tympanic  Weight: 168 lb 3.2 oz (76.3 kg)  Height: '6\' 1"'  (1.854 m)   Physical Exam Cardiovascular:     Rate and Rhythm: Normal rate and regular rhythm.     Heart sounds: Normal heart sounds.  Pulmonary:     Effort: Pulmonary effort is normal.  Abdominal:     General: Bowel sounds are normal.     Palpations: Abdomen is soft.  Musculoskeletal:     Cervical back: Normal range of motion.  Skin:    General: Skin is warm and dry.  Neurological:     Mental Status: He is alert and oriented to person, place, and time.      CMP Latest Ref Rng & Units 05/19/2019  Glucose 70 - 99 mg/dL 95  BUN 6 - 20 mg/dL 6  Creatinine 0.61 - 1.24 mg/dL 0.67  Sodium 135 - 145 mmol/L 138  Potassium 3.5 - 5.1 mmol/L 3.7  Chloride 98 - 111 mmol/L 102  CO2 22 - 32 mmol/L 26  Calcium 8.9 - 10.3 mg/dL 9.1  Total Protein 6.5 - 8.1 g/dL 7.1  Total Bilirubin 0.3 - 1.2 mg/dL 0.4  Alkaline Phos 38 - 126 U/L 72  AST 15 - 41 U/L 27  ALT 0 - 44 U/L 31   CBC Latest Ref Rng & Units 05/19/2019  WBC 4.0 - 10.5 K/uL 6.5  Hemoglobin 13.0 - 17.0 g/dL 13.9  Hematocrit 39.0 - 52.0 % 43.0  Platelets 150 - 400 K/uL 107(L)     Assessment and plan- Patient is a 39 y.o. 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 He is here for on treatment assessment prior to cycle 6 of adjuvant FOLFOX chemotherapy  Counts okay to proceed with cycle 6 of adjuvant FOLFOX chemotherapy today.  He has chemo-induced thrombocytopenia and we have eliminated 5-FU bolus and reduce the dose of oxaliplatin to 65 mg per metered square.  Platelet counts are more than 75  today and he will proceed with cycle 6 today.  I will see him back in 2 weeks for cycle 7.  Chemo-induced nausea: Continue as needed Marinol  Chemo-induced thrombocytopenia: Stable continue to monitor  Chemo-induced peripheral neuropathy: Mild grade 1.  Oxaliplatin has been dose reduced  as above.  Continue to monitor   Visit Diagnosis 1. Encounter for antineoplastic chemotherapy   2. 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[place])   3. Chemotherapy-induced thrombocytopenia   4. Chemotherapy-induced peripheral neuropathy ([place])   5. Chemotherapy-induced nausea      Dr. Randa Evens, MD, MPH [facility] at [facility] [phone] 05/25/2019 8:12 AM

## 2019-06-01 ENCOUNTER — Other Ambulatory Visit: Payer: Self-pay

## 2019-06-01 ENCOUNTER — Encounter: Payer: Self-pay | Admitting: Oncology

## 2019-06-01 NOTE — Progress Notes (Signed)
Patient stated that at this time he had been doing well and that he had not yet gone back to work. However, this morning he was told that he did not have a job any longer and is very stressed about the situation.

## 2019-06-02 ENCOUNTER — Inpatient Hospital Stay: Payer: PRIVATE HEALTH INSURANCE

## 2019-06-02 ENCOUNTER — Inpatient Hospital Stay (HOSPITAL_BASED_OUTPATIENT_CLINIC_OR_DEPARTMENT_OTHER): Payer: PRIVATE HEALTH INSURANCE | Admitting: Oncology

## 2019-06-02 VITALS — BP 135/87 | HR 84 | Temp 97.4°F | Ht 73.0 in | Wt 173.0 lb

## 2019-06-02 DIAGNOSIS — C188 Malignant neoplasm of overlapping sites of colon: Secondary | ICD-10-CM | POA: Diagnosis not present

## 2019-06-02 DIAGNOSIS — D6959 Other secondary thrombocytopenia: Secondary | ICD-10-CM | POA: Diagnosis not present

## 2019-06-02 DIAGNOSIS — T451X5A Adverse effect of antineoplastic and immunosuppressive drugs, initial encounter: Secondary | ICD-10-CM

## 2019-06-02 DIAGNOSIS — Z5111 Encounter for antineoplastic chemotherapy: Secondary | ICD-10-CM | POA: Diagnosis not present

## 2019-06-02 DIAGNOSIS — G620 Drug-induced polyneuropathy: Secondary | ICD-10-CM | POA: Diagnosis not present

## 2019-06-02 LAB — CBC WITH DIFFERENTIAL/PLATELET
Abs Immature Granulocytes: 0.04 10*3/uL (ref 0.00–0.07)
Basophils Absolute: 0 10*3/uL (ref 0.0–0.1)
Basophils Relative: 1 %
Eosinophils Absolute: 0.2 10*3/uL (ref 0.0–0.5)
Eosinophils Relative: 3 %
HCT: 39.6 % (ref 39.0–52.0)
Hemoglobin: 13.3 g/dL (ref 13.0–17.0)
Immature Granulocytes: 1 %
Lymphocytes Relative: 19 %
Lymphs Abs: 1.5 10*3/uL (ref 0.7–4.0)
MCH: 29.5 pg (ref 26.0–34.0)
MCHC: 33.6 g/dL (ref 30.0–36.0)
MCV: 87.8 fL (ref 80.0–100.0)
Monocytes Absolute: 0.9 10*3/uL (ref 0.1–1.0)
Monocytes Relative: 11 %
Neutro Abs: 5.1 10*3/uL (ref 1.7–7.7)
Neutrophils Relative %: 65 %
Platelets: 99 10*3/uL — ABNORMAL LOW (ref 150–400)
RBC: 4.51 MIL/uL (ref 4.22–5.81)
RDW: 16.2 % — ABNORMAL HIGH (ref 11.5–15.5)
WBC: 7.8 10*3/uL (ref 4.0–10.5)
nRBC: 0 % (ref 0.0–0.2)

## 2019-06-02 LAB — COMPREHENSIVE METABOLIC PANEL
ALT: 17 U/L (ref 0–44)
AST: 19 U/L (ref 15–41)
Albumin: 3.6 g/dL (ref 3.5–5.0)
Alkaline Phosphatase: 79 U/L (ref 38–126)
Anion gap: 8 (ref 5–15)
BUN: 8 mg/dL (ref 6–20)
CO2: 25 mmol/L (ref 22–32)
Calcium: 9 mg/dL (ref 8.9–10.3)
Chloride: 104 mmol/L (ref 98–111)
Creatinine, Ser: 0.79 mg/dL (ref 0.61–1.24)
GFR calc Af Amer: >60 mL/min (ref >60)
GFR calc non Af Amer: >60 mL/min (ref >60)
Glucose, Bld: 118 mg/dL — ABNORMAL HIGH (ref 70–99)
Potassium: 3.7 mmol/L (ref 3.5–5.1)
Sodium: 137 mmol/L (ref 135–145)
Total Bilirubin: 0.4 mg/dL (ref 0.3–1.2)
Total Protein: 6.5 g/dL (ref 6.5–8.1)

## 2019-06-02 MED ORDER — SODIUM CHLORIDE 0.9 % IV SOLN
2400.0000 mg/m2 | INTRAVENOUS | Status: DC
  Administered 2019-06-02: 4800 mg via INTRAVENOUS
  Filled 2019-06-02: qty 96

## 2019-06-02 MED ORDER — OXALIPLATIN CHEMO INJECTION 100 MG/20ML
65.0000 mg/m2 | Freq: Once | INTRAVENOUS | Status: AC
  Administered 2019-06-02: 130 mg via INTRAVENOUS
  Filled 2019-06-02: qty 20

## 2019-06-02 MED ORDER — LEUCOVORIN CALCIUM INJECTION 350 MG
398.0000 mg/m2 | Freq: Once | INTRAVENOUS | Status: AC
  Administered 2019-06-02: 800 mg via INTRAVENOUS
  Filled 2019-06-02: qty 25

## 2019-06-02 MED ORDER — PALONOSETRON HCL INJECTION 0.25 MG/5ML
0.2500 mg | Freq: Once | INTRAVENOUS | Status: AC
  Administered 2019-06-02: 0.25 mg via INTRAVENOUS
  Filled 2019-06-02: qty 5

## 2019-06-02 MED ORDER — SODIUM CHLORIDE 0.9% FLUSH
10.0000 mL | Freq: Once | INTRAVENOUS | Status: AC
  Administered 2019-06-02: 10 mL via INTRAVENOUS
  Filled 2019-06-02: qty 10

## 2019-06-02 MED ORDER — HEPARIN SOD (PORK) LOCK FLUSH 100 UNIT/ML IV SOLN
500.0000 [IU] | Freq: Once | INTRAVENOUS | Status: DC
  Filled 2019-06-02: qty 5

## 2019-06-02 MED ORDER — SODIUM CHLORIDE 0.9 % IV SOLN
10.0000 mg | Freq: Once | INTRAVENOUS | Status: AC
  Administered 2019-06-02: 10 mg via INTRAVENOUS
  Filled 2019-06-02: qty 10

## 2019-06-02 MED ORDER — DEXTROSE 5 % IV SOLN
Freq: Once | INTRAVENOUS | Status: AC
  Filled 2019-06-02: qty 250

## 2019-06-02 NOTE — Progress Notes (Signed)
plt 99, ok to tx per md

## 2019-06-04 ENCOUNTER — Inpatient Hospital Stay: Payer: PRIVATE HEALTH INSURANCE

## 2019-06-04 VITALS — BP 129/76 | HR 84

## 2019-06-04 DIAGNOSIS — Z5111 Encounter for antineoplastic chemotherapy: Secondary | ICD-10-CM | POA: Diagnosis not present

## 2019-06-04 DIAGNOSIS — C188 Malignant neoplasm of overlapping sites of colon: Secondary | ICD-10-CM

## 2019-06-04 MED ORDER — HEPARIN SOD (PORK) LOCK FLUSH 100 UNIT/ML IV SOLN
INTRAVENOUS | Status: AC
  Filled 2019-06-04: qty 5

## 2019-06-04 MED ORDER — SODIUM CHLORIDE 0.9% FLUSH
10.0000 mL | INTRAVENOUS | Status: DC | PRN
  Administered 2019-06-04: 16:00:00 10 mL
  Filled 2019-06-04: qty 10

## 2019-06-04 MED ORDER — HEPARIN SOD (PORK) LOCK FLUSH 100 UNIT/ML IV SOLN
500.0000 [IU] | Freq: Once | INTRAVENOUS | Status: AC | PRN
  Administered 2019-06-04: 500 [IU]
  Filled 2019-06-04: qty 5

## 2019-06-04 NOTE — Progress Notes (Signed)
Hematology/Oncology Consult note [facility]  Telephone:([phone] Fax:([phone]) [phone]  Patient Care Team: Patient, No Pcp Per as PCP - General (General Practice) Clent Jacks, RN as Oncology Nurse Navigator   Name of the patient: Tristan [name]  [id number]  11/18/1980   Date of visit: 06/04/19  Diagnosis- 1. 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 Vicente Males back in July 2018 for abdominal pain. At that time CT showed inflammation of the sigmoid colon extending into the rectum. He was given a course of antibiotics and was asked to follow-up with GI. A colonoscopy was attempted at that time but was very hard to get past the sigmoid colon as it was tight and tortuous. Rectal biopsies at that time were normal. CT colonography and barium enema was recommended but patient was lost to follow-up. He then presented to the emergency room in August 2020 with symptoms of right lower quadrant abdominal pain. CT showed masslike thickening involving the cecum and the ascending colon the terminal ileum with surrounding inflammatory changes differentials included infectious/inflammatory enterocolitis as well as colonic neoplasm. Since it was difficult to do a colonoscopy a barium enema was performed on 01/09/2019 which showed a large irregular apple core lesion involving the lower portion of the right colon. Since it was difficult for the patient to undergo colonoscopy he was referred to Dr. Adora Fridge for definitive surgical management.  Patient underwent right hemicolectomy on 01/30/2019. 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 With invasion of visceral peritoneum with findings of peritonitis  adhesions and abscesses. Metastatic carcinoma in 2 out of 245 regional lymph nodes. Margins were negative. PT4PN1. Appendix encased in adhesions with adjacent abscess. MSI testing showed low probability of MSI high. 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 Ki-67 less than 3%  Adjuvant FOLFOX chemotherapy started on 03/10/2019. Neuroendocrine tumor is being monitored  Interval history-patient reports that his blood tested his urine for [facility] and which was found to be positive and he was fired from his job.  Currently patient nausea is well controlled with Marinol.Peripheral neuropathy in his hands and feet is mild and intermittent.  He denies other complaints at this time  ECOG PS- 0 Pain scale- 0   Review of systems- Review of Systems  Constitutional: Negative for chills, fever, malaise/fatigue and weight loss.  HENT: Negative for congestion, ear discharge and nosebleeds.   Eyes: Negative for blurred vision.  Respiratory: Negative for cough, hemoptysis, sputum production, shortness of breath and wheezing.   Cardiovascular: Negative for chest pain, palpitations, orthopnea and claudication.  Gastrointestinal: Positive for nausea. Negative for abdominal pain, blood in stool, constipation, diarrhea, heartburn, melena and vomiting.  Genitourinary: Negative for dysuria, flank pain, frequency, hematuria and urgency.  Musculoskeletal: Negative for back pain, joint pain and myalgias.  Skin: Negative for rash.  Neurological: Positive for sensory change (Peripheral neuropathy). Negative for dizziness, tingling, focal weakness, seizures, weakness and headaches.  Endo/Heme/Allergies: Does not bruise/bleed easily.  Psychiatric/Behavioral: Negative for depression and suicidal ideas. The patient does not have insomnia.       No Known Allergies   Past Medical History:  Diagnosis Date  . Abdominal pain   . Anemia   . Asthma   . Cancer ([place])   . Family  history of breast cancer   . Family history of colon cancer   . GERD (gastroesophageal reflux disease)   . Nausea   . Poor appetite   . Weight loss      Past Surgical History:  Procedure Laterality Date  . COLONOSCOPY N/A 01/30/2019   Procedure: COLONOSCOPY;  Surgeon: Jules Husbands, MD;  Location: [facility] ORS;  Service: General;  Laterality: N/A;  . COLONOSCOPY WITH PROPOFOL N/A 08/16/2016   Procedure: COLONOSCOPY WITH PROPOFOL;  Surgeon: [name], MD;  Location: [facility] ENDOSCOPY;  Service: Endoscopy;  Laterality: N/A;  . COLONOSCOPY WITH PROPOFOL N/A 01/30/2019   Procedure: COLONOSCOPY WITH PROPOFOL;  Surgeon: [name], MD;  Location[facility] END[facility]COPY;  Service: Gastroenterology;  Laterality: N/A;  TRAVEL CASE TO O.R. - PROCEDURE TO START AT 7:30 AM IN O.R.  . LAPAROSCOPIC RIGHT COLECTOMY N/A 01/30/2019   Procedure: LAPAROSCOPIC HAND ASSISTED RIGHT COLECTOMY CONVERTED TO OPEN PROCEDURE;  Surgeon: Jules Husbands, MD;  Location: [facility] ORS;  Service: General;  Laterality: N/A;  . NO PAST SURGERIES    . PORTACATH PLACEMENT Right 03/04/2019   Procedure: INSERTION PORT-A-CATH;  Surgeon: Jules Husbands, MD;  Location: [facility] ORS;  Service: General;  Laterality: Right;    Social History   Socioeconomic History  . Marital status: Single    Spouse name: Not on file  . Number of children: Not on file  . Years of education: Not on file  . Highest education level: Not on file  Occupational History  . Not on file  Tobacco Use  . Smoking status: Current Every Day Smoker    Packs/day: 0.50    Types: Cigarettes  . Smokeless tobacco: Never Used  Substance and Sexual Activity  . Alcohol use: Not Currently  . Drug use: Yes    Types: Marijuana    Comment: last week  . Sexual activity: Yes    Birth control/protection: Condom  Other Topics Concern  . Not on file  Social History Narrative  . Not on file   Social Determinants of Health   Financial Resource Strain:   . Difficulty of Paying  Living Expenses:   Food Insecurity:   . Worried About Charity fundraiser in the Last Year:   . Arboriculturist in the Last Year:   Transportation Needs:   . Film/video editor (Medical):   Marland Kitchen Lack of Transportation (Non-Medical):   Physical Activity:   . Days of Exercise per Week:   . Minutes of Exercise per Session:   Stress:   . Feeling of Stress :   Social Connections:   . Frequency of Communication with Friends and Family:   . Frequency of Social Gatherings with Friends and Family:   . Attends Religious Services:   . Active Member of Clubs or Organizations:   . Attends Archivist Meetings:   Marland Kitchen Marital Status:   Intimate Partner Violence:   . Fear of Current or Ex-Partner:   . Emotionally Abused:   Marland Kitchen Physically Abused:   . Sexually Abused:     Family History  Problem Relation Age of Onset  . Diabetes Mother 22       Type 1  . Breast cancer Mother 34  . Healthy Father   . Colon polyps Father   . Ulcerative colitis Paternal Aunt   . Multiple sclerosis Paternal Aunt   . Dementia Paternal Uncle   . Breast cancer Maternal Grandmother 80  . Breast cancer Paternal Grandmother 66  .  Rectal cancer Maternal Great-grandmother   . Colon cancer Cousin      Current Outpatient Medications:  .  alum hydroxide-mag trisilicate (GAVISCON) 01-75 MG CHEW chewable tablet, Chew 2 tablets by mouth as needed., Disp: , Rfl:  .  calcium carbonate (TUMS - DOSED IN MG ELEMENTAL CALCIUM) 500 MG chewable tablet, Chew 2 tablets by mouth 2 (two) times daily as needed for indigestion or heartburn. , Disp: , Rfl:  .  dronabinol (MARINOL) 5 MG capsule, Take 1 capsule (5 mg total) by mouth 2 (two) times daily before a meal., Disp: 28 capsule, Rfl: 1 .  lidocaine-prilocaine (EMLA) cream, Apply 1 application topically as directed. Apply small amt over port site 1 hour prior to chemo, place saran wrap over the cream to protect clothing, Disp: 5 g, Rfl: 1 .  ondansetron (ZOFRAN) 4 MG tablet,  Take 1 tablet (4 mg total) by mouth every 8 (eight) hours as needed for nausea or vomiting., Disp: 30 tablet, Rfl: 0 .  prochlorperazine (COMPAZINE) 10 MG tablet, Take 1 tablet (10 mg total) by mouth every 6 (six) hours as needed for nausea or vomiting., Disp: 30 tablet, Rfl: 1  Physical exam:  Vitals:   06/02/19 0839  BP: 135/87  Pulse: 84  Temp: (!) 97.4 F (36.3 C)  TempSrc: Tympanic  Weight: 173 lb (78.5 kg)  Height: '6\' 1"'  (1.854 m)   Physical Exam Constitutional:      General: He is not in acute distress. Pulmonary:     Effort: Pulmonary effort is normal.  Skin:    General: Skin is warm and dry.  Neurological:     Mental Status: He is alert and oriented to person, place, and time.      CMP Latest Ref Rng & Units 06/02/2019  Glucose 70 - 99 mg/dL 118(H)  BUN 6 - 20 mg/dL 8  Creatinine 0.61 - 1.24 mg/dL 0.79  Sodium 135 - 145 mmol/L 137  Potassium 3.5 - 5.1 mmol/L 3.7  Chloride 98 - 111 mmol/L 104  CO2 22 - 32 mmol/L 25  Calcium 8.9 - 10.3 mg/dL 9.0  Total Protein 6.5 - 8.1 g/dL 6.5  Total Bilirubin 0.3 - 1.2 mg/dL 0.4  Alkaline Phos 38 - 126 U/L 79  AST 15 - 41 U/L 19  ALT 0 - 44 U/L 17   CBC Latest Ref Rng & Units 06/02/2019  WBC 4.0 - 10.5 K/uL 7.8  Hemoglobin 13.0 - 17.0 g/dL 13.3  Hematocrit 39.0 - 52.0 % 39.6  Platelets 150 - 400 K/uL 99(L)      Assessment and plan- Patient is a 39 y.o. 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  He is here for on treatment assessment prior to cycle 7 of adjuvant FOLFOX chemotherapy  Counts okay to proceed with cycle 7 of adjuvant FOLFOX chemotherapy today.  He does have thrombocytopenia likely secondary to oxaliplatin.  He has dropped his 5-FU bolus dosing and oxaliplatin has been dose reduced to 65 mg per metered square.  Platelet counts are more than 75 and therefore we can proceed with chemotherapy as planned.   He will come back on day 3 for pump disconnect.  I will see him back in 2 weeks time for cycle 8.  Plan is to complete 12 cycles.  Chemo-induced nausea: Patient was initially started on as needed Zofran and Compazine which he said was not helping his nausea.  At that time he was taking marijuana on and off which he felt helped him better than Zofran and Compazine.  We had therefore switched him to Marinol as needed for chemo-induced nausea starting end of February 2021.  We will also given a letter to his employer stating that we have been giving him prescription Marinol.  Patient states that he stopped taking marijuana about 2 weeks prior.  He is currently looking into talking to his lawyer as he was fired from his job for testing positive for [facility] while he was on Marinol.  Chemo-induced peripheral neuropathy: Mild grade 1.  Oxaliplatin has been dose reduced.  Continue to monitor   Visit Diagnosis 1. Chemotherapy-induced thrombocytopenia   2. Chemotherapy-induced peripheral neuropathy ([place])   3. Encounter for antineoplastic chemotherapy      Dr. Randa Evens, MD, MPH [facility] at [facility] [phone] 06/04/2019 9:47 AM

## 2019-06-09 NOTE — Progress Notes (Signed)
Pharmacist Chemotherapy Monitoring - Follow Up Assessment    I verify that I have reviewed each item in the below checklist:  . Regimen for the patient is scheduled for the appropriate day and plan matches scheduled date. Marland Kitchen Appropriate non-routine labs are ordered dependent on drug ordered. . If applicable, additional medications reviewed and ordered per protocol based on lifetime cumulative doses and/or treatment regimen.   Plan for follow-up and/or issues identified: No . I-vent associated with next due treatment: No . MD and/or nursing notified: No  Aika Brzoska K 06/09/2019 8:49 AM

## 2019-06-16 ENCOUNTER — Inpatient Hospital Stay: Payer: PRIVATE HEALTH INSURANCE | Attending: Oncology

## 2019-06-16 ENCOUNTER — Other Ambulatory Visit: Payer: Self-pay

## 2019-06-16 ENCOUNTER — Encounter: Payer: Self-pay | Admitting: Oncology

## 2019-06-16 ENCOUNTER — Inpatient Hospital Stay (HOSPITAL_BASED_OUTPATIENT_CLINIC_OR_DEPARTMENT_OTHER): Payer: PRIVATE HEALTH INSURANCE | Admitting: Oncology

## 2019-06-16 ENCOUNTER — Inpatient Hospital Stay: Payer: PRIVATE HEALTH INSURANCE

## 2019-06-16 VITALS — BP 106/73 | HR 70 | Temp 96.6°F | Resp 18 | Wt 171.3 lb

## 2019-06-16 DIAGNOSIS — C188 Malignant neoplasm of overlapping sites of colon: Secondary | ICD-10-CM | POA: Insufficient documentation

## 2019-06-16 DIAGNOSIS — T451X5A Adverse effect of antineoplastic and immunosuppressive drugs, initial encounter: Secondary | ICD-10-CM

## 2019-06-16 DIAGNOSIS — Z5111 Encounter for antineoplastic chemotherapy: Secondary | ICD-10-CM

## 2019-06-16 DIAGNOSIS — G620 Drug-induced polyneuropathy: Secondary | ICD-10-CM | POA: Insufficient documentation

## 2019-06-16 DIAGNOSIS — Z9049 Acquired absence of other specified parts of digestive tract: Secondary | ICD-10-CM | POA: Diagnosis not present

## 2019-06-16 DIAGNOSIS — C7A012 Malignant carcinoid tumor of the ileum: Secondary | ICD-10-CM | POA: Insufficient documentation

## 2019-06-16 DIAGNOSIS — J45909 Unspecified asthma, uncomplicated: Secondary | ICD-10-CM | POA: Diagnosis not present

## 2019-06-16 DIAGNOSIS — Z79899 Other long term (current) drug therapy: Secondary | ICD-10-CM | POA: Diagnosis not present

## 2019-06-16 DIAGNOSIS — D6959 Other secondary thrombocytopenia: Secondary | ICD-10-CM | POA: Diagnosis not present

## 2019-06-16 DIAGNOSIS — F17210 Nicotine dependence, cigarettes, uncomplicated: Secondary | ICD-10-CM | POA: Diagnosis not present

## 2019-06-16 LAB — CBC WITH DIFFERENTIAL/PLATELET
Abs Immature Granulocytes: 0.02 10*3/uL (ref 0.00–0.07)
Basophils Absolute: 0 10*3/uL (ref 0.0–0.1)
Basophils Relative: 1 %
Eosinophils Absolute: 0.2 10*3/uL (ref 0.0–0.5)
Eosinophils Relative: 4 %
HCT: 40.5 % (ref 39.0–52.0)
Hemoglobin: 13.4 g/dL (ref 13.0–17.0)
Immature Granulocytes: 0 %
Lymphocytes Relative: 25 %
Lymphs Abs: 1.6 10*3/uL (ref 0.7–4.0)
MCH: 29.6 pg (ref 26.0–34.0)
MCHC: 33.1 g/dL (ref 30.0–36.0)
MCV: 89.4 fL (ref 80.0–100.0)
Monocytes Absolute: 0.7 10*3/uL (ref 0.1–1.0)
Monocytes Relative: 11 %
Neutro Abs: 3.8 10*3/uL (ref 1.7–7.7)
Neutrophils Relative %: 59 %
Platelets: 103 10*3/uL — ABNORMAL LOW (ref 150–400)
RBC: 4.53 MIL/uL (ref 4.22–5.81)
RDW: 16.4 % — ABNORMAL HIGH (ref 11.5–15.5)
WBC: 6.3 10*3/uL (ref 4.0–10.5)
nRBC: 0 % (ref 0.0–0.2)

## 2019-06-16 LAB — COMPREHENSIVE METABOLIC PANEL
ALT: 20 U/L (ref 0–44)
AST: 20 U/L (ref 15–41)
Albumin: 3.7 g/dL (ref 3.5–5.0)
Alkaline Phosphatase: 77 U/L (ref 38–126)
Anion gap: 9 (ref 5–15)
BUN: 7 mg/dL (ref 6–20)
CO2: 26 mmol/L (ref 22–32)
Calcium: 8.7 mg/dL — ABNORMAL LOW (ref 8.9–10.3)
Chloride: 104 mmol/L (ref 98–111)
Creatinine, Ser: 0.79 mg/dL (ref 0.61–1.24)
GFR calc Af Amer: >60 mL/min (ref >60)
GFR calc non Af Amer: >60 mL/min (ref >60)
Glucose, Bld: 93 mg/dL (ref 70–99)
Potassium: 3.7 mmol/L (ref 3.5–5.1)
Sodium: 139 mmol/L (ref 135–145)
Total Bilirubin: 0.6 mg/dL (ref 0.3–1.2)
Total Protein: 6.7 g/dL (ref 6.5–8.1)

## 2019-06-16 MED ORDER — SODIUM CHLORIDE 0.9 % IV SOLN
10.0000 mg | Freq: Once | INTRAVENOUS | Status: AC
  Administered 2019-06-16: 10 mg via INTRAVENOUS
  Filled 2019-06-16: qty 10

## 2019-06-16 MED ORDER — LEUCOVORIN CALCIUM INJECTION 350 MG
800.0000 mg | Freq: Once | INTRAVENOUS | Status: AC
  Administered 2019-06-16: 800 mg via INTRAVENOUS
  Filled 2019-06-16: qty 17.5

## 2019-06-16 MED ORDER — SODIUM CHLORIDE 0.9% FLUSH
10.0000 mL | Freq: Once | INTRAVENOUS | Status: AC
  Administered 2019-06-16: 10 mL via INTRAVENOUS
  Filled 2019-06-16: qty 10

## 2019-06-16 MED ORDER — OXALIPLATIN CHEMO INJECTION 100 MG/20ML
65.0000 mg/m2 | Freq: Once | INTRAVENOUS | Status: AC
  Administered 2019-06-16: 130 mg via INTRAVENOUS
  Filled 2019-06-16: qty 20

## 2019-06-16 MED ORDER — PALONOSETRON HCL INJECTION 0.25 MG/5ML
0.2500 mg | Freq: Once | INTRAVENOUS | Status: AC
  Administered 2019-06-16: 0.25 mg via INTRAVENOUS
  Filled 2019-06-16: qty 5

## 2019-06-16 MED ORDER — LEUCOVORIN CALCIUM INJECTION 350 MG
400.0000 mg/m2 | Freq: Once | INTRAVENOUS | Status: DC
  Filled 2019-06-16: qty 40.2

## 2019-06-16 MED ORDER — DEXTROSE 5 % IV SOLN
Freq: Once | INTRAVENOUS | Status: AC
  Filled 2019-06-16: qty 250

## 2019-06-16 MED ORDER — SODIUM CHLORIDE 0.9 % IV SOLN
2400.0000 mg/m2 | INTRAVENOUS | Status: DC
  Administered 2019-06-16: 4800 mg via INTRAVENOUS
  Filled 2019-06-16: qty 96

## 2019-06-16 NOTE — Progress Notes (Signed)
Patient is here for follow up and treatment he is doing well no complaints

## 2019-06-18 ENCOUNTER — Other Ambulatory Visit: Payer: Self-pay

## 2019-06-18 ENCOUNTER — Inpatient Hospital Stay: Payer: PRIVATE HEALTH INSURANCE

## 2019-06-18 VITALS — BP 112/69 | HR 73 | Resp 18

## 2019-06-18 DIAGNOSIS — Z5111 Encounter for antineoplastic chemotherapy: Secondary | ICD-10-CM | POA: Diagnosis not present

## 2019-06-18 DIAGNOSIS — C188 Malignant neoplasm of overlapping sites of colon: Secondary | ICD-10-CM

## 2019-06-18 MED ORDER — HEPARIN SOD (PORK) LOCK FLUSH 100 UNIT/ML IV SOLN
INTRAVENOUS | Status: AC
  Filled 2019-06-18: qty 5

## 2019-06-18 MED ORDER — SODIUM CHLORIDE 0.9% FLUSH
10.0000 mL | INTRAVENOUS | Status: DC | PRN
  Administered 2019-06-18: 10 mL
  Filled 2019-06-18: qty 10

## 2019-06-18 MED ORDER — HEPARIN SOD (PORK) LOCK FLUSH 100 UNIT/ML IV SOLN
500.0000 [IU] | Freq: Once | INTRAVENOUS | Status: AC | PRN
  Administered 2019-06-18: 500 [IU]
  Filled 2019-06-18: qty 5

## 2019-06-18 NOTE — Progress Notes (Signed)
Hematology/Oncology Consult note [facility]  Telephone:([phone] Fax:(336) [phone]  Patient Care Team: Patient, No Pcp Per as PCP - General (General Practice) Clent Jacks, RN as Oncology Nurse Navigator   Name of the patient: Tristan [name]  [id number]  [birth year]   Date of visit: 06/18/19  Diagnosis-  1. 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 Vicente Males back in July 2018 for abdominal pain. At that time CT showed inflammation of the sigmoid colon extending into the rectum. He was given a course of antibiotics and was asked to follow-up with GI. A colonoscopy was attempted at that time but was very hard to get past the sigmoid colon as it was tight and tortuous. Rectal biopsies at that time were normal. CT colonography and barium enema was recommended but patient was lost to follow-up. He then presented to the emergency room in August 2020 with symptoms of right lower quadrant abdominal pain. CT showed masslike thickening involving the cecum and the ascending colon the terminal ileum with surrounding inflammatory changes differentials included infectious/inflammatory enterocolitis as well as colonic neoplasm. Since it was difficult to do a colonoscopy a barium enema was performed on 01/09/2019 which showed a large irregular apple core lesion involving the lower portion of the right colon. Since it was difficult for the patient to undergo colonoscopy he was referred to Dr. Adora Fridge for definitive surgical management.  Patient underwent right hemicolectomy on 01/30/2019. 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 With invasion of visceral peritoneum with findings of peritonitis  adhesions and abscesses. Metastatic carcinoma in 2 out of 245 regional lymph nodes. Margins were negative. PT4PN1. Appendix encased in adhesions with adjacent abscess. MSI testing showed low probability of MSI high. 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 Ki-67 less than 3%  Adjuvant FOLFOX chemotherapy started on 03/10/2019. Neuroendocrine tumor is being monitored  Interval history-patient is currently tolerating chemotherapy well.  Reports Marinol is working for his nausea and vomiting.  Denies any significant tingling numbness but has intermittent sensation when he is exposed to cold.  ECOG PS- 0 Pain scale- 0   Review of systems- Review of Systems  Constitutional: Positive for malaise/fatigue. Negative for chills, fever and weight loss.  HENT: Negative for congestion, ear discharge and nosebleeds.   Eyes: Negative for blurred vision.  Respiratory: Negative for cough, hemoptysis, sputum production, shortness of breath and wheezing.   Cardiovascular: Negative for chest pain, palpitations, orthopnea and claudication.  Gastrointestinal: Negative for abdominal pain, blood in stool, constipation, diarrhea, heartburn, melena, nausea and vomiting.  Genitourinary: Negative for dysuria, flank pain, frequency, hematuria and urgency.  Musculoskeletal: Negative for back pain, joint pain and myalgias.  Skin: Negative for rash.  Neurological: Positive for sensory change (Peripheral neuropathy). Negative for dizziness, tingling, focal weakness, seizures, weakness and headaches.  Endo/Heme/Allergies: Does not bruise/bleed easily.  Psychiatric/Behavioral: Negative for depression and suicidal ideas. The patient does not have insomnia.        No Known Allergies   Past Medical History:  Diagnosis Date  . Abdominal pain   . Anemia   . Asthma   . Cancer ([place])   . Family history of breast cancer   . Family history of colon cancer   . GERD  (  gastroesophageal reflux disease)   . Nausea   . Poor appetite   . Weight loss      Past Surgical History:  Procedure Laterality Date  . COLONOSCOPY N/A 01/30/2019   Procedure: COLONOSCOPY;  Surgeon: Jules Husbands, MD;  Location: [facility] ORS;  Service: General;  Laterality: N/A;  . COLONOSCOPY WITH PROPOFOL N/A 08/16/2016   Procedure: COLONOSCOPY WITH PROPOFOL;  Surgeon: [name], MD;  Location: [facility] ENDOSCOPY;  Service: Endoscopy;  Laterality: N/A;  . COLONOSCOPY WITH PROPOFOL N/A 01/30/2019   Procedure: COLONOSCOPY WITH PROPOFOL;  Surgeon: [name], MD;  Location: [facility] ENDOSCOPY;  Service: Gastroenterology;  Laterality: N/A;  TRAVEL CASE TO O.R. - PROCEDURE TO START AT 7:30 AM IN O.R.  . LAPAROSCOPIC RIGHT COLECTOMY N/A 01/30/2019   Procedure: LAPAROSCOPIC HAND ASSISTED RIGHT COLECTOMY CONVERTED TO OPEN PROCEDURE;  Surgeon: Jules Husbands, MD;  Location: [facility] ORS;  Service: General;  Laterality: N/A;  . NO PAST SURGERIES    . PORTACATH PLACEMENT Right 03/04/2019   Procedure: INSERTION PORT-A-CATH;  Surgeon: Jules Husbands, MD;  Location: [facility] ORS;  Service: General;  Laterality: Right;    Social History   Socioeconomic History  . Marital status: Single    Spouse name: Not on file  . Number of children: Not on file  . Years of education: Not on file  . Highest education level: Not on file  Occupational History  . Not on file  Tobacco Use  . Smoking status: Current Every Day Smoker    Packs/day: 0.50    Types: Cigarettes  . Smokeless tobacco: Never Used  Substance and Sexual Activity  . Alcohol use: Not Currently  . Drug use: Yes    Types: Marijuana    Comment: last week  . Sexual activity: Yes    Birth control/protection: Condom  Other Topics Concern  . Not on file  Social History Narrative  . Not on file   Social Determinants of Health   Financial Resource Strain:   . Difficulty of Paying Living Expenses:   Food Insecurity:   . Worried About Sales executive in the Last Year:   . Arboriculturist in the Last Year:   Transportation Needs:   . Film/video editor (Medical):   Marland Kitchen Lack of Transportation (Non-Medical):   Physical Activity:   . Days of Exercise per Week:   . Minutes of Exercise per Session:   Stress:   . Feeling of Stress :   Social Connections:   . Frequency of Communication with Friends and Family:   . Frequency of Social Gatherings with Friends and Family:   . Attends Religious Services:   . Active Member of Clubs or Organizations:   . Attends Archivist Meetings:   Marland Kitchen Marital Status:   Intimate Partner Violence:   . Fear of Current or Ex-Partner:   . Emotionally Abused:   Marland Kitchen Physically Abused:   . Sexually Abused:     Family History  Problem Relation Age of Onset  . Diabetes Mother 52       Type 1  . Breast cancer Mother 60  . Healthy Father   . Colon polyps Father   . Ulcerative colitis Paternal Aunt   . Multiple sclerosis Paternal Aunt   . Dementia Paternal Uncle   . Breast cancer Maternal Grandmother 57  . Breast cancer Paternal Grandmother 106  . Rectal cancer Maternal Great-grandmother   . Colon cancer Cousin  Current Outpatient Medications:  .  alum hydroxide-mag trisilicate (GAVISCON) 09-81 MG CHEW chewable tablet, Chew 2 tablets by mouth as needed., Disp: , Rfl:  .  calcium carbonate (OS-CAL) 1250 (500 Ca) MG chewable tablet, Chew by mouth., Disp: , Rfl:  .  calcium carbonate (TUMS - DOSED IN MG ELEMENTAL CALCIUM) 500 MG chewable tablet, Chew 2 tablets by mouth 2 (two) times daily as needed for indigestion or heartburn. , Disp: , Rfl:  .  dronabinol (MARINOL) 5 MG capsule, Take 1 capsule (5 mg total) by mouth 2 (two) times daily before a meal., Disp: 28 capsule, Rfl: 1 .  lidocaine-prilocaine (EMLA) cream, Apply 1 application topically as directed. Apply small amt over port site 1 hour prior to chemo, place saran wrap over the cream to protect clothing, Disp: 5 g, Rfl: 1 .   ondansetron (ZOFRAN) 4 MG tablet, Take 1 tablet (4 mg total) by mouth every 8 (eight) hours as needed for nausea or vomiting., Disp: 30 tablet, Rfl: 0 .  prochlorperazine (COMPAZINE) 10 MG tablet, Take 1 tablet (10 mg total) by mouth every 6 (six) hours as needed for nausea or vomiting., Disp: 30 tablet, Rfl: 1  Physical exam:  Vitals:   06/16/19 0906  BP: 106/73  Pulse: 70  Resp: 18  Temp: (!) 96.6 F (35.9 C)  TempSrc: Tympanic  SpO2: 99%  Weight: 171 lb 4.8 oz (77.7 kg)   Physical Exam Constitutional:      General: He is not in acute distress. Cardiovascular:     Rate and Rhythm: Normal rate and regular rhythm.     Heart sounds: Normal heart sounds.  Pulmonary:     Effort: Pulmonary effort is normal.     Breath sounds: Normal breath sounds.  Abdominal:     General: Bowel sounds are normal.     Palpations: Abdomen is soft.     Comments: Midline surgical scar well-healed  Skin:    General: Skin is warm and dry.  Neurological:     Mental Status: He is alert and oriented to person, place, and time.      CMP Latest Ref Rng & Units 06/16/2019  Glucose 70 - 99 mg/dL 93  BUN 6 - 20 mg/dL 7  Creatinine 0.61 - 1.24 mg/dL 0.79  Sodium 135 - 145 mmol/L 139  Potassium 3.5 - 5.1 mmol/L 3.7  Chloride 98 - 111 mmol/L 104  CO2 22 - 32 mmol/L 26  Calcium 8.9 - 10.3 mg/dL 8.7(L)  Total Protein 6.5 - 8.1 g/dL 6.7  Total Bilirubin 0.3 - 1.2 mg/dL 0.6  Alkaline Phos 38 - 126 U/L 77  AST 15 - 41 U/L 20  ALT 0 - 44 U/L 20   CBC Latest Ref Rng & Units 06/16/2019  WBC 4.0 - 10.5 K/uL 6.3  Hemoglobin 13.0 - 17.0 g/dL 13.4  Hematocrit 39.0 - 52.0 % 40.5  Platelets 150 - 400 K/uL 103(L)     Assessment and plan- Patient is a 39 y.o. 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   He is here for on treatment assessment prior to cycle 8 of adjuvant FOLFOX chemotherapy  Counts okay  to proceed with infusional 5-FU and oxaliplatin chemotherapy today.  He does have thrombocytopenia likely a combination of 5-FU and oxaliplatin.  We will drop the 5-FU bolus and reduce the dose of oxaliplatin to 65 mg per metered square.  I will  see him back in 2 weeks time for cycle 9  Chemo-induced nausea vomiting: He is currently on Marinol and his symptoms are relatively well controlled  Chemo-induced thrombocytopenia: Stable plan as above  Chemo-induced peripheral neuropathy: Mild grade 1 intermittent.  Continue to monitor   Visit Diagnosis 1. Encounter for antineoplastic chemotherapy   2. Chemotherapy-induced thrombocytopenia   3. Chemotherapy-induced peripheral neuropathy ([place])   4. Overlapping malignant neoplasm of colon [facility])      Dr. Randa Evens, MD, MPH [facility] at [facility] [phone] 06/18/2019 8:52 AM

## 2019-06-23 NOTE — Progress Notes (Signed)

## 2019-06-30 ENCOUNTER — Inpatient Hospital Stay (HOSPITAL_BASED_OUTPATIENT_CLINIC_OR_DEPARTMENT_OTHER): Payer: PRIVATE HEALTH INSURANCE | Admitting: Oncology

## 2019-06-30 ENCOUNTER — Inpatient Hospital Stay: Payer: PRIVATE HEALTH INSURANCE

## 2019-06-30 ENCOUNTER — Other Ambulatory Visit: Payer: Self-pay

## 2019-06-30 ENCOUNTER — Encounter: Payer: Self-pay | Admitting: Oncology

## 2019-06-30 VITALS — BP 113/71 | HR 78 | Temp 95.9°F | Ht 73.0 in | Wt 172.0 lb

## 2019-06-30 DIAGNOSIS — Z5111 Encounter for antineoplastic chemotherapy: Secondary | ICD-10-CM | POA: Diagnosis not present

## 2019-06-30 DIAGNOSIS — C188 Malignant neoplasm of overlapping sites of colon: Secondary | ICD-10-CM

## 2019-06-30 DIAGNOSIS — T451X5A Adverse effect of antineoplastic and immunosuppressive drugs, initial encounter: Secondary | ICD-10-CM | POA: Diagnosis not present

## 2019-06-30 DIAGNOSIS — G620 Drug-induced polyneuropathy: Secondary | ICD-10-CM | POA: Diagnosis not present

## 2019-06-30 LAB — CBC WITH DIFFERENTIAL/PLATELET
Abs Immature Granulocytes: 0.06 10*3/uL (ref 0.00–0.07)
Basophils Absolute: 0.1 10*3/uL (ref 0.0–0.1)
Basophils Relative: 1 %
Eosinophils Absolute: 0.2 10*3/uL (ref 0.0–0.5)
Eosinophils Relative: 3 %
HCT: 39.8 % (ref 39.0–52.0)
Hemoglobin: 13.2 g/dL (ref 13.0–17.0)
Immature Granulocytes: 1 %
Lymphocytes Relative: 21 %
Lymphs Abs: 1.6 10*3/uL (ref 0.7–4.0)
MCH: 30.1 pg (ref 26.0–34.0)
MCHC: 33.2 g/dL (ref 30.0–36.0)
MCV: 90.9 fL (ref 80.0–100.0)
Monocytes Absolute: 0.8 10*3/uL (ref 0.1–1.0)
Monocytes Relative: 11 %
Neutro Abs: 5 10*3/uL (ref 1.7–7.7)
Neutrophils Relative %: 63 %
Platelets: 112 10*3/uL — ABNORMAL LOW (ref 150–400)
RBC: 4.38 MIL/uL (ref 4.22–5.81)
RDW: 16.1 % — ABNORMAL HIGH (ref 11.5–15.5)
WBC: 7.8 10*3/uL (ref 4.0–10.5)
nRBC: 0 % (ref 0.0–0.2)

## 2019-06-30 LAB — COMPREHENSIVE METABOLIC PANEL
ALT: 18 U/L (ref 0–44)
AST: 21 U/L (ref 15–41)
Albumin: 3.8 g/dL (ref 3.5–5.0)
Alkaline Phosphatase: 80 U/L (ref 38–126)
Anion gap: 10 (ref 5–15)
BUN: 8 mg/dL (ref 6–20)
CO2: 26 mmol/L (ref 22–32)
Calcium: 8.8 mg/dL — ABNORMAL LOW (ref 8.9–10.3)
Chloride: 103 mmol/L (ref 98–111)
Creatinine, Ser: 0.74 mg/dL (ref 0.61–1.24)
GFR calc Af Amer: >60 mL/min (ref >60)
GFR calc non Af Amer: >60 mL/min (ref >60)
Glucose, Bld: 106 mg/dL — ABNORMAL HIGH (ref 70–99)
Potassium: 3.9 mmol/L (ref 3.5–5.1)
Sodium: 139 mmol/L (ref 135–145)
Total Bilirubin: 0.5 mg/dL (ref 0.3–1.2)
Total Protein: 7.1 g/dL (ref 6.5–8.1)

## 2019-06-30 MED ORDER — PALONOSETRON HCL INJECTION 0.25 MG/5ML
0.2500 mg | Freq: Once | INTRAVENOUS | Status: AC
  Administered 2019-06-30: 0.25 mg via INTRAVENOUS
  Filled 2019-06-30: qty 5

## 2019-06-30 MED ORDER — SODIUM CHLORIDE 0.9 % IV SOLN
10.0000 mg | Freq: Once | INTRAVENOUS | Status: AC
  Administered 2019-06-30: 10 mg via INTRAVENOUS
  Filled 2019-06-30: qty 10

## 2019-06-30 MED ORDER — LEUCOVORIN CALCIUM INJECTION 350 MG
800.0000 mg | Freq: Once | INTRAVENOUS | Status: AC
  Administered 2019-06-30: 800 mg via INTRAVENOUS
  Filled 2019-06-30: qty 25

## 2019-06-30 MED ORDER — SODIUM CHLORIDE 0.9 % IV SOLN
2400.0000 mg/m2 | INTRAVENOUS | Status: DC
  Administered 2019-06-30: 4800 mg via INTRAVENOUS
  Filled 2019-06-30: qty 96

## 2019-06-30 MED ORDER — DEXTROSE 5 % IV SOLN
Freq: Once | INTRAVENOUS | Status: AC
  Filled 2019-06-30: qty 250

## 2019-06-30 MED ORDER — OXALIPLATIN CHEMO INJECTION 100 MG/20ML
65.0000 mg/m2 | Freq: Once | INTRAVENOUS | Status: AC
  Administered 2019-06-30: 10:00:00 130 mg via INTRAVENOUS
  Filled 2019-06-30: qty 20

## 2019-06-30 MED ORDER — SODIUM CHLORIDE 0.9% FLUSH
10.0000 mL | INTRAVENOUS | Status: DC | PRN
  Administered 2019-06-30: 10 mL via INTRAVENOUS
  Filled 2019-06-30: qty 10

## 2019-06-30 NOTE — Progress Notes (Signed)
Patient stated that he had been doing well with no complaints. 

## 2019-07-02 ENCOUNTER — Other Ambulatory Visit: Payer: Self-pay

## 2019-07-02 ENCOUNTER — Inpatient Hospital Stay: Payer: PRIVATE HEALTH INSURANCE

## 2019-07-02 VITALS — BP 124/78 | HR 85 | Temp 96.3°F | Resp 18

## 2019-07-02 DIAGNOSIS — C188 Malignant neoplasm of overlapping sites of colon: Secondary | ICD-10-CM

## 2019-07-02 DIAGNOSIS — Z5111 Encounter for antineoplastic chemotherapy: Secondary | ICD-10-CM | POA: Diagnosis not present

## 2019-07-02 MED ORDER — SODIUM CHLORIDE 0.9% FLUSH
10.0000 mL | INTRAVENOUS | Status: DC | PRN
  Administered 2019-07-02: 10 mL
  Filled 2019-07-02: qty 10

## 2019-07-02 MED ORDER — HEPARIN SOD (PORK) LOCK FLUSH 100 UNIT/ML IV SOLN
500.0000 [IU] | Freq: Once | INTRAVENOUS | Status: AC | PRN
  Administered 2019-07-02: 500 [IU]
  Filled 2019-07-02: qty 5

## 2019-07-02 MED ORDER — HEPARIN SOD (PORK) LOCK FLUSH 100 UNIT/ML IV SOLN
INTRAVENOUS | Status: AC
  Filled 2019-07-02: qty 5

## 2019-07-02 NOTE — Progress Notes (Signed)
Hematology/Oncology Consult note [facility]  [phone] [phone]  Patient Care Team: Patient, No Pcp Per as PCP - General (General Practice) Clent Jacks, RN as Oncology Nurse Navigator   Name of the patient: Tristan [name]  [id number]  05-09-1980   Date of visit: 07/02/19  Diagnosis- 1. 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 Vicente Males back in July 2018 for abdominal pain. At that time CT showed inflammation of the sigmoid colon extending into the rectum. He was given a course of antibiotics and was asked to follow-up with GI. A colonoscopy was attempted at that time but was very hard to get past the sigmoid colon as it was tight and tortuous. Rectal biopsies at that time were normal. CT colonography and barium enema was recommended but patient was lost to follow-up. He then presented to the emergency room in August 2020 with symptoms of right lower quadrant abdominal pain. CT showed masslike thickening involving the cecum and the ascending colon the terminal ileum with surrounding inflammatory changes differentials included infectious/inflammatory enterocolitis as well as colonic neoplasm. Since it was difficult to do a colonoscopy a barium enema was performed on 01/09/2019 which showed a large irregular apple core lesion involving the lower portion of the right colon. Since it was difficult for the patient to undergo colonoscopy he was referred to Dr. Adora Fridge for definitive surgical management.  Patient underwent right hemicolectomy on 01/30/2019. 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 With invasion of visceral peritoneum with findings of peritonitis  adhesions and abscesses. Metastatic carcinoma in 2 out of 245 regional lymph nodes. Margins were negative. PT4PN1. Appendix encased in adhesions with adjacent abscess. MSI testing showed low probability of MSI high. 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 Ki-67 less than 3%  Adjuvant FOLFOX chemotherapy started on 03/10/2019. Neuroendocrine tumor is being monitored  Interval history- tolerating chemotherapy well. Nausea well controlled with prn marinol. Neuropathy is mild and intermittent  ECOG PS- 0 Pain scale- 0   Review of systems- Review of Systems  Constitutional: Negative for chills, fever, malaise/fatigue and weight loss.  HENT: Negative for congestion, ear discharge and nosebleeds.   Eyes: Negative for blurred vision.  Respiratory: Negative for cough, hemoptysis, sputum production, shortness of breath and wheezing.   Cardiovascular: Negative for chest pain, palpitations, orthopnea and claudication.  Gastrointestinal: Negative for abdominal pain, blood in stool, constipation, diarrhea, heartburn, melena, nausea and vomiting.  Genitourinary: Negative for dysuria, flank pain, frequency, hematuria and urgency.  Musculoskeletal: Negative for back pain, joint pain and myalgias.  Skin: Negative for rash.  Neurological: Negative for dizziness, tingling, focal weakness, seizures, weakness and headaches.  Endo/Heme/Allergies: Does not bruise/bleed easily.  Psychiatric/Behavioral: Negative for depression and suicidal ideas. The patient does not have insomnia.      No Known Allergies   Past Medical History:  Diagnosis Date  . Abdominal pain   . Anemia   . Asthma   . Cancer ([place])   . Family history of breast cancer   . Family history of colon cancer   . GERD (gastroesophageal reflux disease)   . Nausea   . Poor appetite   . Weight loss      Past Surgical History:  Procedure Laterality  Date  . COLONOSCOPY N/A 01/30/2019    Procedure: COLONOSCOPY;  Surgeon: Jules Husbands, MD;  Location: [facility] ORS;  Service: General;  Laterality: N/A;  . COLONOSCOPY WITH PROPOFOL N/A 08/16/2016   Procedure: COLONOSCOPY WITH PROPOFOL;  Surgeon: [name], MD;  Location: [facility] ENDOSCOPY;  Service: Endoscopy;  Laterality: N/A;  . COLONOSCOPY WITH PROPOFOL N/A 01/30/2019   Procedure: COLONOSCOPY WITH PROPOFOL;  Surgeon: [name], MD;  Location: [facility] ENDOSCOPY;  Service: Gastroenterology;  Laterality: N/A;  TRAVEL CASE TO O.R. - PROCEDURE TO START AT 7:30 AM IN O.R.  . LAPAROSCOPIC RIGHT COLECTOMY N/A 01/30/2019   Procedure: LAPAROSCOPIC HAND ASSISTED RIGHT COLECTOMY CONVERTED TO OPEN PROCEDURE;  Surgeon: Jules Husbands, MD;  Location: [facility] ORS;  Service: General;  Laterality: N/A;  . NO PAST SURGERIES    . PORTACATH PLACEMENT Right 03/04/2019   Procedure: INSERTION PORT-A-CATH;  Surgeon: Jules Husbands, MD;  Location: [facility] ORS;  Service: General;  Laterality: Right;    Social History   Socioeconomic History  . Marital status: Single    Spouse name: Not on file  . Number of children: Not on file  . Years of education: Not on file  . Highest education level: Not on file  Occupational History  . Not on file  Tobacco Use  . Smoking status: Current Every Day Smoker    Packs/day: 0.50    Types: Cigarettes  . Smokeless tobacco: Never Used  Substance and Sexual Activity  . Alcohol use: Not Currently  . Drug use: Yes    Types: Marijuana    Comment: last week  . Sexual activity: Yes    Birth control/protection: Condom  Other Topics Concern  . Not on file  Social History Narrative  . Not on file   Social Determinants of Health   Financial Resource Strain:   . Difficulty of Paying Living Expenses:   Food Insecurity:   . Worried About Charity fundraiser in the Last Year:   . Arboriculturist in the Last Year:   Transportation Needs:   . Film/video editor (Medical):   Marland Kitchen Lack of Transportation (Non-Medical):     Physical Activity:   . Days of Exercise per Week:   . Minutes of Exercise per Session:   Stress:   . Feeling of Stress :   Social Connections:   . Frequency of Communication with Friends and Family:   . Frequency of Social Gatherings with Friends and Family:   . Attends Religious Services:   . Active Member of Clubs or Organizations:   . Attends Archivist Meetings:   Marland Kitchen Marital Status:   Intimate Partner Violence:   . Fear of Current or Ex-Partner:   . Emotionally Abused:   Marland Kitchen Physically Abused:   . Sexually Abused:     Family History  Problem Relation Age of Onset  . Diabetes Mother 38       Type 1  . Breast cancer Mother 65  . Healthy Father   . Colon polyps Father   . Ulcerative colitis Paternal Aunt   . Multiple sclerosis Paternal Aunt   . Dementia Paternal Uncle   . Breast cancer Maternal Grandmother 29  . Breast cancer Paternal Grandmother 74  . Rectal cancer Maternal Great-grandmother   . Colon cancer Cousin      Current Outpatient Medications:  .  alum hydroxide-mag trisilicate (GAVISCON) 25-42 MG CHEW chewable tablet, Chew 2 tablets by mouth as needed., Disp: , Rfl:  .  calcium carbonate (OS-CAL) 1250 (500 Ca) MG chewable tablet, Chew by mouth., Disp: , Rfl:  .  calcium carbonate (TUMS - DOSED IN MG ELEMENTAL CALCIUM) 500 MG chewable tablet, Chew 2 tablets by mouth 2 (two) times daily as needed for indigestion or heartburn. , Disp: , Rfl:  .  dronabinol (MARINOL) 5 MG capsule, Take 1 capsule (5 mg total) by mouth 2 (two) times daily before a meal., Disp: 28 capsule, Rfl: 1 .  lidocaine-prilocaine (EMLA) cream, Apply 1 application topically as directed. Apply small amt over port site 1 hour prior to chemo, place saran wrap over the cream to protect clothing, Disp: 5 g, Rfl: 1 .  ondansetron (ZOFRAN) 4 MG tablet, Take 1 tablet (4 mg total) by mouth every 8 (eight) hours as needed for nausea or vomiting., Disp: 30 tablet, Rfl: 0 .  prochlorperazine  (COMPAZINE) 10 MG tablet, Take 1 tablet (10 mg total) by mouth every 6 (six) hours as needed for nausea or vomiting., Disp: 30 tablet, Rfl: 1  Physical exam:  Vitals:   06/30/19 0841  BP: 113/71  Pulse: 78  Temp: (!) 95.9 F (35.5 C)  TempSrc: Tympanic  Weight: 172 lb (78 kg)  Height: '6\' 1"'  (1.854 m)   Physical Exam   CMP Latest Ref Rng & Units 06/30/2019  Glucose 70 - 99 mg/dL 106(H)  BUN 6 - 20 mg/dL 8  Creatinine 0.61 - 1.24 mg/dL 0.74  Sodium 135 - 145 mmol/L 139  Potassium 3.5 - 5.1 mmol/L 3.9  Chloride 98 - 111 mmol/L 103  CO2 22 - 32 mmol/L 26  Calcium 8.9 - 10.3 mg/dL 8.8(L)  Total Protein 6.5 - 8.1 g/dL 7.1  Total Bilirubin 0.3 - 1.2 mg/dL 0.5  Alkaline Phos 38 - 126 U/L 80  AST 15 - 41 U/L 21  ALT 0 - 44 U/L 18   CBC Latest Ref Rng & Units 06/30/2019  WBC 4.0 - 10.5 K/uL 7.8  Hemoglobin 13.0 - 17.0 g/dL 13.2  Hematocrit 39.0 - 52.0 % 39.8  Platelets 150 - 400 K/uL 112(L)      Assessment and plan- Patient is a 39 y.o. 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 he is here for on treatment assessment prior to cycle 9 of adjuvant folfox   Counts ok to proceed with cycle 9 of adjuvant folfox chemotherapy today. He has thrombocytopenia likely due to 38f/oxaliplatin. 527fbolus has been dropped. He is receiving oxaliplatin at reduced dose of 65 mg/meter square. I will see him in 2 weeks for cycle 10  Chemo induced peripheral neuropathy- stable. Mild grade 1. Continue to monitor    Visit Diagnosis 1. Encounter for antineoplastic chemotherapy   2. Chemotherapy-induced peripheral neuropathy ([facility]  3. 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[facility]     Dr. ArRanda EvensMD, MPH [id number]/22/2021 1:03 PM

## 2019-07-07 NOTE — Progress Notes (Signed)
Pharmacist Chemotherapy Monitoring - Follow Up Assessment    I verify that I have reviewed each item in the below checklist:  . Regimen for the patient is scheduled for the appropriate day and plan matches scheduled date. Marland Kitchen Appropriate non-routine labs are ordered dependent on drug ordered. . If applicable, additional medications reviewed and ordered per protocol based on lifetime cumulative doses and/or treatment regimen.   Plan for follow-up and/or issues identified: No . I-vent associated with next due treatment: No . MD and/or nursing notified: No  Ellianna Ruest K 07/07/2019 8:46 AM

## 2019-07-14 ENCOUNTER — Encounter: Payer: Self-pay | Admitting: Oncology

## 2019-07-14 ENCOUNTER — Inpatient Hospital Stay: Payer: PRIVATE HEALTH INSURANCE

## 2019-07-14 ENCOUNTER — Other Ambulatory Visit: Payer: Self-pay

## 2019-07-14 ENCOUNTER — Inpatient Hospital Stay (HOSPITAL_BASED_OUTPATIENT_CLINIC_OR_DEPARTMENT_OTHER): Payer: PRIVATE HEALTH INSURANCE | Admitting: Oncology

## 2019-07-14 ENCOUNTER — Inpatient Hospital Stay: Payer: PRIVATE HEALTH INSURANCE | Attending: Oncology

## 2019-07-14 VITALS — BP 123/84 | HR 72 | Temp 95.6°F | Resp 16 | Ht 73.0 in | Wt 170.0 lb

## 2019-07-14 DIAGNOSIS — R110 Nausea: Secondary | ICD-10-CM | POA: Diagnosis not present

## 2019-07-14 DIAGNOSIS — Z5111 Encounter for antineoplastic chemotherapy: Secondary | ICD-10-CM | POA: Diagnosis not present

## 2019-07-14 DIAGNOSIS — F17210 Nicotine dependence, cigarettes, uncomplicated: Secondary | ICD-10-CM | POA: Insufficient documentation

## 2019-07-14 DIAGNOSIS — G620 Drug-induced polyneuropathy: Secondary | ICD-10-CM

## 2019-07-14 DIAGNOSIS — T451X5A Adverse effect of antineoplastic and immunosuppressive drugs, initial encounter: Secondary | ICD-10-CM | POA: Diagnosis not present

## 2019-07-14 DIAGNOSIS — D6959 Other secondary thrombocytopenia: Secondary | ICD-10-CM | POA: Insufficient documentation

## 2019-07-14 DIAGNOSIS — R5383 Other fatigue: Secondary | ICD-10-CM | POA: Insufficient documentation

## 2019-07-14 DIAGNOSIS — Z79899 Other long term (current) drug therapy: Secondary | ICD-10-CM | POA: Diagnosis not present

## 2019-07-14 DIAGNOSIS — R5381 Other malaise: Secondary | ICD-10-CM | POA: Diagnosis not present

## 2019-07-14 DIAGNOSIS — C188 Malignant neoplasm of overlapping sites of colon: Secondary | ICD-10-CM | POA: Insufficient documentation

## 2019-07-14 DIAGNOSIS — Z9049 Acquired absence of other specified parts of digestive tract: Secondary | ICD-10-CM | POA: Diagnosis not present

## 2019-07-14 DIAGNOSIS — C7989 Secondary malignant neoplasm of other specified sites: Secondary | ICD-10-CM | POA: Insufficient documentation

## 2019-07-14 LAB — COMPREHENSIVE METABOLIC PANEL
ALT: 15 U/L (ref 0–44)
AST: 19 U/L (ref 15–41)
Albumin: 3.8 g/dL (ref 3.5–5.0)
Alkaline Phosphatase: 85 U/L (ref 38–126)
Anion gap: 10 (ref 5–15)
BUN: 8 mg/dL (ref 6–20)
CO2: 27 mmol/L (ref 22–32)
Calcium: 8.9 mg/dL (ref 8.9–10.3)
Chloride: 103 mmol/L (ref 98–111)
Creatinine, Ser: 0.84 mg/dL (ref 0.61–1.24)
GFR calc Af Amer: >60 mL/min (ref >60)
GFR calc non Af Amer: >60 mL/min (ref >60)
Glucose, Bld: 93 mg/dL (ref 70–99)
Potassium: 3.9 mmol/L (ref 3.5–5.1)
Sodium: 140 mmol/L (ref 135–145)
Total Bilirubin: 1 mg/dL (ref 0.3–1.2)
Total Protein: 6.7 g/dL (ref 6.5–8.1)

## 2019-07-14 LAB — CBC WITH DIFFERENTIAL/PLATELET
Abs Immature Granulocytes: 0.06 10*3/uL (ref 0.00–0.07)
Basophils Absolute: 0 10*3/uL (ref 0.0–0.1)
Basophils Relative: 1 %
Eosinophils Absolute: 0.2 10*3/uL (ref 0.0–0.5)
Eosinophils Relative: 3 %
HCT: 39.1 % (ref 39.0–52.0)
Hemoglobin: 13.1 g/dL (ref 13.0–17.0)
Immature Granulocytes: 1 %
Lymphocytes Relative: 21 %
Lymphs Abs: 1.6 10*3/uL (ref 0.7–4.0)
MCH: 30.9 pg (ref 26.0–34.0)
MCHC: 33.5 g/dL (ref 30.0–36.0)
MCV: 92.2 fL (ref 80.0–100.0)
Monocytes Absolute: 0.8 10*3/uL (ref 0.1–1.0)
Monocytes Relative: 11 %
Neutro Abs: 5 10*3/uL (ref 1.7–7.7)
Neutrophils Relative %: 63 %
Platelets: 102 10*3/uL — ABNORMAL LOW (ref 150–400)
RBC: 4.24 MIL/uL (ref 4.22–5.81)
RDW: 15.7 % — ABNORMAL HIGH (ref 11.5–15.5)
WBC: 7.8 10*3/uL (ref 4.0–10.5)
nRBC: 0 % (ref 0.0–0.2)

## 2019-07-14 MED ORDER — LEUCOVORIN CALCIUM INJECTION 350 MG
800.0000 mg | Freq: Once | INTRAVENOUS | Status: AC
  Administered 2019-07-14: 10:00:00 800 mg via INTRAVENOUS
  Filled 2019-07-14: qty 40

## 2019-07-14 MED ORDER — SODIUM CHLORIDE 0.9 % IV SOLN
10.0000 mg | Freq: Once | INTRAVENOUS | Status: AC
  Administered 2019-07-14: 10 mg via INTRAVENOUS
  Filled 2019-07-14: qty 10

## 2019-07-14 MED ORDER — DEXTROSE 5 % IV SOLN
Freq: Once | INTRAVENOUS | Status: AC
  Filled 2019-07-14: qty 250

## 2019-07-14 MED ORDER — PALONOSETRON HCL INJECTION 0.25 MG/5ML
0.2500 mg | Freq: Once | INTRAVENOUS | Status: AC
  Administered 2019-07-14: 0.25 mg via INTRAVENOUS
  Filled 2019-07-14: qty 5

## 2019-07-14 MED ORDER — SODIUM CHLORIDE 0.9 % IV SOLN
2400.0000 mg/m2 | INTRAVENOUS | Status: DC
  Administered 2019-07-14: 13:00:00 4800 mg via INTRAVENOUS
  Filled 2019-07-14: qty 96

## 2019-07-14 MED ORDER — SODIUM CHLORIDE 0.9% FLUSH
10.0000 mL | INTRAVENOUS | Status: DC | PRN
  Administered 2019-07-14: 10 mL via INTRAVENOUS
  Filled 2019-07-14: qty 10

## 2019-07-14 MED ORDER — OXALIPLATIN CHEMO INJECTION 100 MG/20ML
65.0000 mg/m2 | Freq: Once | INTRAVENOUS | Status: AC
  Administered 2019-07-14: 10:00:00 130 mg via INTRAVENOUS
  Filled 2019-07-14: qty 20

## 2019-07-14 NOTE — Progress Notes (Signed)
Pt is the same. Has gas at end of week and next week. Has watery stools but by next week some solid form-knows about imodium but does not use it.

## 2019-07-16 ENCOUNTER — Other Ambulatory Visit: Payer: Self-pay

## 2019-07-16 ENCOUNTER — Inpatient Hospital Stay: Payer: PRIVATE HEALTH INSURANCE

## 2019-07-16 DIAGNOSIS — Z5111 Encounter for antineoplastic chemotherapy: Secondary | ICD-10-CM | POA: Diagnosis not present

## 2019-07-16 DIAGNOSIS — C188 Malignant neoplasm of overlapping sites of colon: Secondary | ICD-10-CM

## 2019-07-16 MED ORDER — SODIUM CHLORIDE 0.9% FLUSH
10.0000 mL | INTRAVENOUS | Status: DC | PRN
  Administered 2019-07-16: 10 mL
  Filled 2019-07-16: qty 10

## 2019-07-16 MED ORDER — HEPARIN SOD (PORK) LOCK FLUSH 100 UNIT/ML IV SOLN
INTRAVENOUS | Status: AC
  Filled 2019-07-16: qty 5

## 2019-07-16 MED ORDER — HEPARIN SOD (PORK) LOCK FLUSH 100 UNIT/ML IV SOLN
500.0000 [IU] | Freq: Once | INTRAVENOUS | Status: AC | PRN
  Administered 2019-07-16: 500 [IU]
  Filled 2019-07-16: qty 5

## 2019-07-17 NOTE — Progress Notes (Signed)
Hematology/Oncology Consult note [facility]  [phone] [phone]  Patient Care Team: Patient, No Pcp Per as PCP - General (General Practice) Clent Jacks, RN as Oncology Nurse Navigator   Name of the patient: Tristan [name]  [id number]  November 16, 1980   Date of visit: 07/17/19  Diagnosis- 1. 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 Vicente Males back in July 2018 for abdominal pain. At that time CT showed inflammation of the sigmoid colon extending into the rectum. He was given a course of antibiotics and was asked to follow-up with GI. A colonoscopy was attempted at that time but was very hard to get past the sigmoid colon as it was tight and tortuous. Rectal biopsies at that time were normal. CT colonography and barium enema was recommended but patient was lost to follow-up. He then presented to the emergency room in August 2020 with symptoms of right lower quadrant abdominal pain. CT showed masslike thickening involving the cecum and the ascending colon the terminal ileum with surrounding inflammatory changes differentials included infectious/inflammatory enterocolitis as well as colonic neoplasm. Since it was difficult to do a colonoscopy a barium enema was performed on 01/09/2019 which showed a large irregular apple core lesion involving the lower portion of the right colon. Since it was difficult for the patient to undergo colonoscopy he was referred to Dr. Adora Fridge for definitive surgical management.  Patient underwent right hemicolectomy on 01/30/2019. 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 With invasion of visceral peritoneum with findings of peritonitis  adhesions and abscesses. Metastatic carcinoma in 2 out of 245 regional lymph nodes. Margins were negative. PT4PN1. Appendix encased in adhesions with adjacent abscess. MSI testing showed low probability of MSI high. 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 Ki-67 less than 3%  Adjuvant FOLFOX chemotherapy started on 03/10/2019. Neuroendocrine tumor is being monitored  Interval history-patient is tolerating chemotherapy well.  He has mild intermittent tingling numbness in his hands and feet which only lasts for a few days after chemotherapy.  Nausea is relatively well controlled with Marinol and he uses that occasionally.  Denies other complaints at this time  ECOG PS- 0 Pain scale- 0   Review of systems- Review of Systems  Constitutional: Positive for malaise/fatigue. Negative for chills, fever and weight loss.  HENT: Negative for congestion, ear discharge and nosebleeds.   Eyes: Negative for blurred vision.  Respiratory: Negative for cough, hemoptysis, sputum production, shortness of breath and wheezing.   Cardiovascular: Negative for chest pain, palpitations, orthopnea and claudication.  Gastrointestinal: Negative for abdominal pain, blood in stool, constipation, diarrhea, heartburn, melena, nausea and vomiting.  Genitourinary: Negative for dysuria, flank pain, frequency, hematuria and urgency.  Musculoskeletal: Negative for back pain, joint pain and myalgias.  Skin: Negative for rash.  Neurological: Negative for dizziness, tingling, focal weakness, seizures, weakness and headaches.  Endo/Heme/Allergies: Does not bruise/bleed easily.  Psychiatric/Behavioral: Negative for depression and suicidal ideas. The patient does not have insomnia.       No Known Allergies   Past Medical History:  Diagnosis Date  . Abdominal pain   . Anemia   . Asthma   . Cancer ([place])   . Family history of breast cancer   . Family history of colon  cancer     . GERD (gastroesophageal reflux disease)   . Nausea   . Poor appetite   . Weight loss      Past Surgical History:  Procedure Laterality Date  . COLONOSCOPY N/A 01/30/2019   Procedure: COLONOSCOPY;  Surgeon: Jules Husbands, MD;  Location: [facility] ORS;  Service: General;  Laterality: N/A;  . COLONOSCOPY WITH PROPOFOL N/A 08/16/2016   Procedure: COLONOSCOPY WITH PROPOFOL;  Surgeon: [name], MD;  Location: [facility] ENDOSCOPY;  Service: Endoscopy;  Laterality: N/A;  . COLONOSCOPY WITH PROPOFOL N/A 01/30/2019   Procedure: COLONOSCOPY WITH PROPOFOL;  Surgeon: [name], MD;  Location: [facility] ENDOSCOPY;  Service: Gastroenterology;  Laterality: N/A;  TRAVEL CASE TO O.R. - PROCEDURE TO START AT 7:30 AM IN O.R.  . LAPAROSCOPIC RIGHT COLECTOMY N/A 01/30/2019   Procedure: LAPAROSCOPIC HAND ASSISTED RIGHT COLECTOMY CONVERTED TO OPEN PROCEDURE;  Surgeon: Jules Husbands, MD;  Location: [facility] ORS;  Service: General;  Laterality: N/A;  . NO PAST SURGERIES    . PORTACATH PLACEMENT Right 03/04/2019   Procedure: INSERTION PORT-A-CATH;  Surgeon: Jules Husbands, MD;  Location: [facility] ORS;  Service: General;  Laterality: Right;    Social History   Socioeconomic History  . Marital status: Single    Spouse name: Not on file  . Number of children: Not on file  . Years of education: Not on file  . Highest education level: Not on file  Occupational History  . Not on file  Tobacco Use  . Smoking status: Current Every Day Smoker    Packs/day: 0.50    Types: Cigarettes  . Smokeless tobacco: Never Used  Substance and Sexual Activity  . Alcohol use: Not Currently    Comment: off weeks of chemo 1-2 beers  . Drug use: Yes    Types: Marijuana    Comment: last week  . Sexual activity: Yes    Birth control/protection: Condom  Other Topics Concern  . Not on file  Social History Narrative  . Not on file   Social Determinants of Health   Financial Resource Strain:   . Difficulty of Paying Living Expenses:    Food Insecurity:   . Worried About Charity fundraiser in the Last Year:   . Arboriculturist in the Last Year:   Transportation Needs:   . Film/video editor (Medical):   Marland Kitchen Lack of Transportation (Non-Medical):   Physical Activity:   . Days of Exercise per Week:   . Minutes of Exercise per Session:   Stress:   . Feeling of Stress :   Social Connections:   . Frequency of Communication with Friends and Family:   . Frequency of Social Gatherings with Friends and Family:   . Attends Religious Services:   . Active Member of Clubs or Organizations:   . Attends Archivist Meetings:   Marland Kitchen Marital Status:   Intimate Partner Violence:   . Fear of Current or Ex-Partner:   . Emotionally Abused:   Marland Kitchen Physically Abused:   . Sexually Abused:     Family History  Problem Relation Age of Onset  . Diabetes Mother 62       Type 1  . Breast cancer Mother 37  . Healthy Father   . Colon polyps Father   . Ulcerative colitis Paternal Aunt   . Multiple sclerosis Paternal Aunt   . Dementia Paternal Uncle   . Breast cancer Maternal Grandmother 82  . Breast cancer Paternal Grandmother 37  .  Rectal cancer Maternal Great-grandmother   . Colon cancer Cousin      Current Outpatient Medications:  .  alum hydroxide-mag trisilicate (GAVISCON) 17-71 MG CHEW chewable tablet, Chew 2 tablets by mouth as needed., Disp: , Rfl:  .  calcium carbonate (TUMS - DOSED IN MG ELEMENTAL CALCIUM) 500 MG chewable tablet, Chew 2 tablets by mouth 2 (two) times daily as needed for indigestion or heartburn. , Disp: , Rfl:  .  dronabinol (MARINOL) 5 MG capsule, Take 1 capsule (5 mg total) by mouth 2 (two) times daily before a meal., Disp: 28 capsule, Rfl: 1 .  calcium carbonate (OS-CAL) 1250 (500 Ca) MG chewable tablet, Chew by mouth., Disp: , Rfl:  .  lidocaine-prilocaine (EMLA) cream, Apply 1 application topically as directed. Apply small amt over port site 1 hour prior to chemo, place saran wrap over the cream  to protect clothing (Patient not taking: Reported on 07/14/2019), Disp: 5 g, Rfl: 1 .  ondansetron (ZOFRAN) 4 MG tablet, Take 1 tablet (4 mg total) by mouth every 8 (eight) hours as needed for nausea or vomiting. (Patient not taking: Reported on 07/14/2019), Disp: 30 tablet, Rfl: 0 .  prochlorperazine (COMPAZINE) 10 MG tablet, Take 1 tablet (10 mg total) by mouth every 6 (six) hours as needed for nausea or vomiting. (Patient not taking: Reported on 07/14/2019), Disp: 30 tablet, Rfl: 1  Physical exam:  Vitals:   07/14/19 0842  BP: 123/84  Pulse: 72  Resp: 16  Temp: (!) 95.6 F (35.3 C)  TempSrc: Tympanic  Weight: 170 lb (77.1 kg)  Height: '6\' 1"'  (1.854 m)   Physical Exam Constitutional:      General: He is not in acute distress. Cardiovascular:     Rate and Rhythm: Normal rate and regular rhythm.     Heart sounds: Normal heart sounds.  Pulmonary:     Effort: Pulmonary effort is normal.     Breath sounds: Normal breath sounds.  Abdominal:     General: Bowel sounds are normal.     Palpations: Abdomen is soft.  Skin:    General: Skin is warm and dry.  Neurological:     Mental Status: He is alert and oriented to person, place, and time.      CMP Latest Ref Rng & Units 07/14/2019  Glucose 70 - 99 mg/dL 93  BUN 6 - 20 mg/dL 8  Creatinine 0.61 - 1.24 mg/dL 0.84  Sodium 135 - 145 mmol/L 140  Potassium 3.5 - 5.1 mmol/L 3.9  Chloride 98 - 111 mmol/L 103  CO2 22 - 32 mmol/L 27  Calcium 8.9 - 10.3 mg/dL 8.9  Total Protein 6.5 - 8.1 g/dL 6.7  Total Bilirubin 0.3 - 1.2 mg/dL 1.0  Alkaline Phos 38 - 126 U/L 85  AST 15 - 41 U/L 19  ALT 0 - 44 U/L 15   CBC Latest Ref Rng & Units 07/14/2019  WBC 4.0 - 10.5 K/uL 7.8  Hemoglobin 13.0 - 17.0 g/dL 13.1  Hematocrit 39.0 - 52.0 % 39.1  Platelets 150 - 400 K/uL 102(L)     Assessment and plan- Patient is a 39 y.o. male  withadenocarcinoma of the colon at least stage III BcpT4a pN1b cMX and well-differentiated neuroendocrine tumor of the distal  ileum stage I PT1PN0.  He is here for on treatment assessment prior to cycle 10 of adjuvant FOLFOX chemotherapy  Counts okay to proceed with cycle 10 of adjuvant FOLFOX chemotherapy today.  He only receives infusional 5-FU and oxaliplatin  at reduced dose of 65 mg/m due to thrombocytopenia.  5-FU bolus has been dropped.  Platelet counts remain stable and over 100 to receive treatment today.  I will see him back in 2 weeks for cycle 11.  Chemo-induced peripheral neuropathy: Mild.  Grade 1 continue to monitor  Chemo induced thrombocytopenia: Stable as above continue to monitor   Visit Diagnosis 1. Encounter for antineoplastic chemotherapy   2. 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[place])   3. Chemotherapy-induced thrombocytopenia   4. Chemotherapy-induced peripheral neuropathy ([place])      Dr. Randa Evens, MD, MPH [facility] [phone] 07/17/2019 7:26 AM

## 2019-07-21 NOTE — Progress Notes (Signed)
Pharmacist Chemotherapy Monitoring - Follow Up Assessment    I verify that I have reviewed each item in the below checklist:  . Regimen for the patient is scheduled for the appropriate day and plan matches scheduled date. Marland Kitchen Appropriate non-routine labs are ordered dependent on drug ordered. . If applicable, additional medications reviewed and ordered per protocol based on lifetime cumulative doses and/or treatment regimen.   Plan for follow-up and/or issues identified: No . I-vent associated with next due treatment: No . MD and/or nursing notified: No  Jaquail Mclees K 07/21/2019 8:10 AM

## 2019-07-28 ENCOUNTER — Inpatient Hospital Stay: Payer: PRIVATE HEALTH INSURANCE

## 2019-07-28 ENCOUNTER — Encounter: Payer: Self-pay | Admitting: Oncology

## 2019-07-28 ENCOUNTER — Other Ambulatory Visit: Payer: Self-pay

## 2019-07-28 ENCOUNTER — Other Ambulatory Visit: Payer: Self-pay | Admitting: *Deleted

## 2019-07-28 ENCOUNTER — Telehealth: Payer: Self-pay | Admitting: *Deleted

## 2019-07-28 ENCOUNTER — Inpatient Hospital Stay (HOSPITAL_BASED_OUTPATIENT_CLINIC_OR_DEPARTMENT_OTHER): Payer: PRIVATE HEALTH INSURANCE | Admitting: Oncology

## 2019-07-28 VITALS — BP 110/73 | HR 70 | Temp 98.2°F | Resp 16 | Ht 73.0 in | Wt 170.0 lb

## 2019-07-28 DIAGNOSIS — Z95828 Presence of other vascular implants and grafts: Secondary | ICD-10-CM

## 2019-07-28 DIAGNOSIS — G620 Drug-induced polyneuropathy: Secondary | ICD-10-CM

## 2019-07-28 DIAGNOSIS — Z5111 Encounter for antineoplastic chemotherapy: Secondary | ICD-10-CM

## 2019-07-28 DIAGNOSIS — C188 Malignant neoplasm of overlapping sites of colon: Secondary | ICD-10-CM

## 2019-07-28 DIAGNOSIS — D6959 Other secondary thrombocytopenia: Secondary | ICD-10-CM

## 2019-07-28 DIAGNOSIS — T451X5A Adverse effect of antineoplastic and immunosuppressive drugs, initial encounter: Secondary | ICD-10-CM

## 2019-07-28 LAB — COMPREHENSIVE METABOLIC PANEL
ALT: 19 U/L (ref 0–44)
AST: 25 U/L (ref 15–41)
Albumin: 3.8 g/dL (ref 3.5–5.0)
Alkaline Phosphatase: 80 U/L (ref 38–126)
Anion gap: 8 (ref 5–15)
BUN: 5 mg/dL — ABNORMAL LOW (ref 6–20)
CO2: 28 mmol/L (ref 22–32)
Calcium: 9 mg/dL (ref 8.9–10.3)
Chloride: 103 mmol/L (ref 98–111)
Creatinine, Ser: 0.81 mg/dL (ref 0.61–1.24)
GFR calc Af Amer: >60 mL/min (ref >60)
GFR calc non Af Amer: >60 mL/min (ref >60)
Glucose, Bld: 110 mg/dL — ABNORMAL HIGH (ref 70–99)
Potassium: 3.7 mmol/L (ref 3.5–5.1)
Sodium: 139 mmol/L (ref 135–145)
Total Bilirubin: 0.8 mg/dL (ref 0.3–1.2)
Total Protein: 7.2 g/dL (ref 6.5–8.1)

## 2019-07-28 LAB — CBC WITH DIFFERENTIAL/PLATELET
Abs Immature Granulocytes: 0.04 10*3/uL (ref 0.00–0.07)
Basophils Absolute: 0 10*3/uL (ref 0.0–0.1)
Basophils Relative: 1 %
Eosinophils Absolute: 0.2 10*3/uL (ref 0.0–0.5)
Eosinophils Relative: 2 %
HCT: 38.2 % — ABNORMAL LOW (ref 39.0–52.0)
Hemoglobin: 12.9 g/dL — ABNORMAL LOW (ref 13.0–17.0)
Immature Granulocytes: 1 %
Lymphocytes Relative: 25 %
Lymphs Abs: 1.7 10*3/uL (ref 0.7–4.0)
MCH: 31.3 pg (ref 26.0–34.0)
MCHC: 33.8 g/dL (ref 30.0–36.0)
MCV: 92.7 fL (ref 80.0–100.0)
Monocytes Absolute: 0.9 10*3/uL (ref 0.1–1.0)
Monocytes Relative: 14 %
Neutro Abs: 3.8 10*3/uL (ref 1.7–7.7)
Neutrophils Relative %: 57 %
Platelets: 88 10*3/uL — ABNORMAL LOW (ref 150–400)
RBC: 4.12 MIL/uL — ABNORMAL LOW (ref 4.22–5.81)
RDW: 14.6 % (ref 11.5–15.5)
WBC: 6.6 10*3/uL (ref 4.0–10.5)
nRBC: 0 % (ref 0.0–0.2)

## 2019-07-28 MED ORDER — SODIUM CHLORIDE 0.9% FLUSH
10.0000 mL | INTRAVENOUS | Status: DC | PRN
  Administered 2019-07-28: 10 mL via INTRAVENOUS
  Filled 2019-07-28: qty 10

## 2019-07-28 MED ORDER — HEPARIN SOD (PORK) LOCK FLUSH 100 UNIT/ML IV SOLN
500.0000 [IU] | Freq: Once | INTRAVENOUS | Status: AC
  Administered 2019-07-28: 500 [IU] via INTRAVENOUS
  Filled 2019-07-28: qty 5

## 2019-07-28 NOTE — Progress Notes (Signed)
Having numbness and tingling on both hands at thumbs, and pointed finger. His toes on both feet have numbness and tingling. He would like a reduced dose

## 2019-07-30 ENCOUNTER — Inpatient Hospital Stay: Payer: PRIVATE HEALTH INSURANCE

## 2019-07-30 NOTE — Progress Notes (Signed)
Hematology/Oncology Consult note [facility]  Telephone:([phone] [phone]  Patient Care Team: Patient, No Pcp Per as PCP - General (General Practice) Clent Jacks, RN as Oncology Nurse Navigator   Name of the patient: Tristan [name]  [id number]  1980-10-01   Date of visit: 07/30/19  Diagnosis- 1. 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 Vicente Males back in July 2018 for abdominal pain. At that time CT showed inflammation of the sigmoid colon extending into the rectum. He was given a course of antibiotics and was asked to follow-up with GI. A colonoscopy was attempted at that time but was very hard to get past the sigmoid colon as it was tight and tortuous. Rectal biopsies at that time were normal. CT colonography and barium enema was recommended but patient was lost to follow-up. He then presented to the emergency room in August 2020 with symptoms of right lower quadrant abdominal pain. CT showed masslike thickening involving the cecum and the ascending colon the terminal ileum with surrounding inflammatory changes differentials included infectious/inflammatory enterocolitis as well as colonic neoplasm. Since it was difficult to do a colonoscopy a barium enema was performed on 01/09/2019 which showed a large irregular apple core lesion involving the lower portion of the right colon. Since it was difficult for the patient to undergo colonoscopy he was referred to Dr. Adora Fridge for definitive surgical management.  Patient underwent right hemicolectomy on 01/30/2019. 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 With invasion of visceral peritoneum with findings of peritonitis  adhesions and abscesses. Metastatic carcinoma in 2 out of 245 regional lymph nodes. Margins were negative. PT4PN1. Appendix encased in adhesions with adjacent abscess. MSI testing showed low probability of MSI high. 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 Ki-67 less than 3%  Adjuvant FOLFOX chemotherapy started on 03/10/2019. Neuroendocrine tumor is being monitored   Interval history-reports nausea is currently well controlled.  Tingling numbness is a little more pronounced in his fingers.  It is still not persistent and does not affect his quality of life significantly.  ECOG PS- 1 Pain scale- 0   Review of systems- Review of Systems  Constitutional: Negative for chills, fever, malaise/fatigue and weight loss.  HENT: Negative for congestion, ear discharge and nosebleeds.   Eyes: Negative for blurred vision.  Respiratory: Negative for cough, hemoptysis, sputum production, shortness of breath and wheezing.   Cardiovascular: Negative for chest pain, palpitations, orthopnea and claudication.  Gastrointestinal: Negative for abdominal pain, blood in stool, constipation, diarrhea, heartburn, melena, nausea and vomiting.  Genitourinary: Negative for dysuria, flank pain, frequency, hematuria and urgency.  Musculoskeletal: Negative for back pain, joint pain and myalgias.  Skin: Negative for rash.  Neurological: Positive for sensory change (Peripheral neuropathy). Negative for dizziness, tingling, focal weakness, seizures, weakness and headaches.  Endo/Heme/Allergies: Does not bruise/bleed easily.  Psychiatric/Behavioral: Negative for depression and suicidal ideas. The patient does not have insomnia.       No Known Allergies   Past Medical History:  Diagnosis Date  . Abdominal pain   . Anemia   . Asthma   . Cancer ([place])   . Colon cancer ([place])   . Family history of breast cancer   . Family history of colon cancer   .  GERD  (gastroesophageal reflux disease)   . Nausea   . Poor appetite   . Weight loss      Past Surgical History:  Procedure Laterality Date  . COLONOSCOPY N/A 01/30/2019   Procedure: COLONOSCOPY;  Surgeon: Jules Husbands, MD;  Location: [facility] ORS;  Service: General;  Laterality: N/A;  . COLONOSCOPY WITH PROPOFOL N/A 08/16/2016   Procedure: COLONOSCOPY WITH PROPOFOL;  Surgeon: [name], MD;  [facility] ENDOSCOPY;  Service: Endoscopy;  Laterality: N/A;  . COLONOSCOPY WITH PROPOFOL N/A 01/30/2019   Procedure: COLONOSCOPY WITH PROPOFOL;  Surgeon: [name], MD;  Location: [facility] ENDOSCOPY;  Service: Gastroenterology;  Laterality: N/A;  TRAVEL CASE TO O.R. - PROCEDURE TO START AT 7:30 AM IN O.R.  . LAPAROSCOPIC RIGHT COLECTOMY N/A 01/30/2019   Procedure: LAPAROSCOPIC HAND ASSISTED RIGHT COLECTOMY CONVERTED TO OPEN PROCEDURE;  Surgeon: Jules Husbands, MD;  Location: [facility] ORS;  Service: General;  Laterality: N/A;  . NO PAST SURGERIES    . PORTACATH PLACEMENT Right 03/04/2019   Procedure: INSERTION PORT-A-CATH;  Surgeon: Jules Husbands, MD;  Location: [facility] ORS;  Service: General;  Laterality: Right;    Social History   Socioeconomic History  . Marital status: Single    Spouse name: Not on file  . Number of children: Not on file  . Years of education: Not on file  . Highest education level: Not on file  Occupational History  . Not on file  Tobacco Use  . Smoking status: Current Every Day Smoker    Packs/day: 0.50    Types: Cigarettes  . Smokeless tobacco: Never Used  Substance and Sexual Activity  . Alcohol use: Not Currently    Comment: off weeks of chemo 1-2 beers  . Drug use: Yes    Types: Marijuana    Comment: last week  . Sexual activity: Yes    Birth control/protection: Condom  Other Topics Concern  . Not on file  Social History Narrative  . Not on file   Social Determinants of Health   Financial Resource Strain:   . Difficulty of Paying Living Expenses:   Food  Insecurity:   . Worried About Charity fundraiser in the Last Year:   . Arboriculturist in the Last Year:   Transportation Needs:   . Film/video editor (Medical):   Marland Kitchen Lack of Transportation (Non-Medical):   Physical Activity:   . Days of Exercise per Week:   . Minutes of Exercise per Session:   Stress:   . Feeling of Stress :   Social Connections:   . Frequency of Communication with Friends and Family:   . Frequency of Social Gatherings with Friends and Family:   . Attends Religious Services:   . Active Member of Clubs or Organizations:   . Attends Archivist Meetings:   Marland Kitchen Marital Status:   Intimate Partner Violence:   . Fear of Current or Ex-Partner:   . Emotionally Abused:   Marland Kitchen Physically Abused:   . Sexually Abused:     Family History  Problem Relation Age of Onset  . Diabetes Mother 18       Type 1  . Breast cancer Mother 26  . Healthy Father   . Colon polyps Father   . Ulcerative colitis Paternal Aunt   . Multiple sclerosis Paternal Aunt   . Dementia Paternal Uncle   . Breast cancer Maternal Grandmother 27  . Breast cancer Paternal Grandmother 19  . Rectal cancer Maternal  Great-grandmother   . Colon cancer Cousin      Current Outpatient Medications:  .  alum hydroxide-mag trisilicate (GAVISCON) 94-49 MG CHEW chewable tablet, Chew 2 tablets by mouth as needed., Disp: , Rfl:  .  dronabinol (MARINOL) 5 MG capsule, Take 1 capsule (5 mg total) by mouth 2 (two) times daily before a meal., Disp: 28 capsule, Rfl: 1  Physical exam:  Vitals:   07/28/19 0914  BP: 110/73  Pulse: 70  Resp: 16  Temp: 98.2 F (36.8 C)  TempSrc: Tympanic  Weight: 170 lb (77.1 kg)  Height: '6\' 1"'  (1.854 m)   Physical Exam Constitutional:      General: He is not in acute distress. Cardiovascular:     Rate and Rhythm: Normal rate and regular rhythm.     Heart sounds: Normal heart sounds.  Pulmonary:     Effort: Pulmonary effort is normal.     Breath sounds: Normal  breath sounds.  Abdominal:     General: Bowel sounds are normal.     Palpations: Abdomen is soft.  Skin:    General: Skin is warm and dry.  Neurological:     Mental Status: He is alert and oriented to person, place, and time.      CMP Latest Ref Rng & Units 07/28/2019  Glucose 70 - 99 mg/dL 110(H)  BUN 6 - 20 mg/dL 5(L)  Creatinine 0.61 - 1.24 mg/dL 0.81  Sodium 135 - 145 mmol/L 139  Potassium 3.5 - 5.1 mmol/L 3.7  Chloride 98 - 111 mmol/L 103  CO2 22 - 32 mmol/L 28  Calcium 8.9 - 10.3 mg/dL 9.0  Total Protein 6.5 - 8.1 g/dL 7.2  Total Bilirubin 0.3 - 1.2 mg/dL 0.8  Alkaline Phos 38 - 126 U/L 80  AST 15 - 41 U/L 25  ALT 0 - 44 U/L 19   CBC Latest Ref Rng & Units 07/28/2019  WBC 4.0 - 10.5 K/uL 6.6  Hemoglobin 13.0 - 17.0 g/dL 12.9(L)  Hematocrit 39.0 - 52.0 % 38.2(L)  Platelets 150 - 400 K/uL 88(L)    Assessment and plan- Patient is a 39 y.o. 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   He is here for on treatment assessment prior to cycle 11 of adjuvant FOLFOX chemotherapy  1.  Platelet counts today are more than 75 and therefore he will proceed with cycle 11 of FOLFOX chemotherapy today.  However I will not anticipate that his platelet counts would be sufficiently up in 2 weeks for cycle 12.  I will therefore see him back in 3 weeks for cycle 12 of FOLFOX chemotherapy.  2.  Given that he had T4 disease I will also refer him to radiation oncology to see if there would be any role for adjuvant radiation treatment at this time  3.  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  We discussed about adding additional medications for his neuropathy which she would like to hold off on doing at this time  4.  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  Likely to recover after chemotherapy is completed.   Visit Diagnosis 1. Overlapping  malignant neoplasm of colon ([place])   2. Encounter for antineoplastic chemotherapy   3. Chemotherapy-induced peripheral neuropathy ([place])      Dr. Randa Evens, MD, MPH [facility] [phone] 07/30/2019 9:01 AM

## 2019-08-04 ENCOUNTER — Inpatient Hospital Stay: Payer: PRIVATE HEALTH INSURANCE

## 2019-08-04 ENCOUNTER — Inpatient Hospital Stay (HOSPITAL_BASED_OUTPATIENT_CLINIC_OR_DEPARTMENT_OTHER): Payer: PRIVATE HEALTH INSURANCE | Admitting: Oncology

## 2019-08-04 ENCOUNTER — Encounter: Payer: Self-pay | Admitting: Oncology

## 2019-08-04 ENCOUNTER — Other Ambulatory Visit: Payer: Self-pay

## 2019-08-04 VITALS — BP 106/65 | HR 67 | Temp 98.0°F | Resp 16 | Wt 163.6 lb

## 2019-08-04 DIAGNOSIS — Z5111 Encounter for antineoplastic chemotherapy: Secondary | ICD-10-CM

## 2019-08-04 DIAGNOSIS — G620 Drug-induced polyneuropathy: Secondary | ICD-10-CM

## 2019-08-04 DIAGNOSIS — C188 Malignant neoplasm of overlapping sites of colon: Secondary | ICD-10-CM

## 2019-08-04 DIAGNOSIS — T451X5A Adverse effect of antineoplastic and immunosuppressive drugs, initial encounter: Secondary | ICD-10-CM

## 2019-08-04 DIAGNOSIS — D6959 Other secondary thrombocytopenia: Secondary | ICD-10-CM

## 2019-08-04 DIAGNOSIS — Z95828 Presence of other vascular implants and grafts: Secondary | ICD-10-CM

## 2019-08-04 LAB — CBC WITH DIFFERENTIAL/PLATELET
Abs Immature Granulocytes: 0.04 10*3/uL (ref 0.00–0.07)
Basophils Absolute: 0.1 10*3/uL (ref 0.0–0.1)
Basophils Relative: 1 %
Eosinophils Absolute: 0.2 10*3/uL (ref 0.0–0.5)
Eosinophils Relative: 4 %
HCT: 39.3 % (ref 39.0–52.0)
Hemoglobin: 13.2 g/dL (ref 13.0–17.0)
Immature Granulocytes: 1 %
Lymphocytes Relative: 27 %
Lymphs Abs: 1.4 10*3/uL (ref 0.7–4.0)
MCH: 31.3 pg (ref 26.0–34.0)
MCHC: 33.6 g/dL (ref 30.0–36.0)
MCV: 93.1 fL (ref 80.0–100.0)
Monocytes Absolute: 0.8 10*3/uL (ref 0.1–1.0)
Monocytes Relative: 15 %
Neutro Abs: 2.8 10*3/uL (ref 1.7–7.7)
Neutrophils Relative %: 52 %
Platelets: 160 10*3/uL (ref 150–400)
RBC: 4.22 MIL/uL (ref 4.22–5.81)
RDW: 14 % (ref 11.5–15.5)
WBC: 5.3 10*3/uL (ref 4.0–10.5)
nRBC: 0 % (ref 0.0–0.2)

## 2019-08-04 LAB — COMPREHENSIVE METABOLIC PANEL
ALT: 16 U/L (ref 0–44)
AST: 20 U/L (ref 15–41)
Albumin: 4.2 g/dL (ref 3.5–5.0)
Alkaline Phosphatase: 84 U/L (ref 38–126)
Anion gap: 8 (ref 5–15)
BUN: 8 mg/dL (ref 6–20)
CO2: 28 mmol/L (ref 22–32)
Calcium: 9 mg/dL (ref 8.9–10.3)
Chloride: 103 mmol/L (ref 98–111)
Creatinine, Ser: 0.83 mg/dL (ref 0.61–1.24)
GFR calc Af Amer: >60 mL/min (ref >60)
GFR calc non Af Amer: >60 mL/min (ref >60)
Glucose, Bld: 92 mg/dL (ref 70–99)
Potassium: 3.9 mmol/L (ref 3.5–5.1)
Sodium: 139 mmol/L (ref 135–145)
Total Bilirubin: 0.5 mg/dL (ref 0.3–1.2)
Total Protein: 7.5 g/dL (ref 6.5–8.1)

## 2019-08-04 MED ORDER — DEXTROSE 5 % IV SOLN
Freq: Once | INTRAVENOUS | Status: AC
  Filled 2019-08-04: qty 250

## 2019-08-04 MED ORDER — PALONOSETRON HCL INJECTION 0.25 MG/5ML
0.2500 mg | Freq: Once | INTRAVENOUS | Status: AC
  Administered 2019-08-04: 0.25 mg via INTRAVENOUS
  Filled 2019-08-04: qty 5

## 2019-08-04 MED ORDER — SODIUM CHLORIDE 0.9 % IV SOLN
10.0000 mg | Freq: Once | INTRAVENOUS | Status: AC
  Administered 2019-08-04: 10 mg via INTRAVENOUS
  Filled 2019-08-04: qty 10

## 2019-08-04 MED ORDER — LEUCOVORIN CALCIUM INJECTION 350 MG
800.0000 mg | Freq: Once | INTRAVENOUS | Status: AC
  Administered 2019-08-04: 800 mg via INTRAVENOUS
  Filled 2019-08-04: qty 25

## 2019-08-04 MED ORDER — SODIUM CHLORIDE 0.9 % IV SOLN
2400.0000 mg/m2 | INTRAVENOUS | Status: DC
  Administered 2019-08-04: 4800 mg via INTRAVENOUS
  Filled 2019-08-04: qty 96

## 2019-08-04 MED ORDER — SODIUM CHLORIDE 0.9% FLUSH
10.0000 mL | INTRAVENOUS | Status: DC | PRN
  Administered 2019-08-04: 10 mL via INTRAVENOUS
  Filled 2019-08-04: qty 10

## 2019-08-04 NOTE — Progress Notes (Signed)
Patient here for oncology follow-up appointment, expresses no complaints or concerns at this time.    

## 2019-08-06 ENCOUNTER — Other Ambulatory Visit: Payer: Self-pay

## 2019-08-06 ENCOUNTER — Inpatient Hospital Stay: Payer: PRIVATE HEALTH INSURANCE

## 2019-08-06 DIAGNOSIS — C188 Malignant neoplasm of overlapping sites of colon: Secondary | ICD-10-CM

## 2019-08-06 DIAGNOSIS — Z5111 Encounter for antineoplastic chemotherapy: Secondary | ICD-10-CM | POA: Diagnosis not present

## 2019-08-06 MED ORDER — HEPARIN SOD (PORK) LOCK FLUSH 100 UNIT/ML IV SOLN
INTRAVENOUS | Status: AC
  Filled 2019-08-06: qty 5

## 2019-08-06 MED ORDER — HEPARIN SOD (PORK) LOCK FLUSH 100 UNIT/ML IV SOLN
500.0000 [IU] | Freq: Once | INTRAVENOUS | Status: AC | PRN
  Administered 2019-08-06: 500 [IU]
  Filled 2019-08-06: qty 5

## 2019-08-06 MED ORDER — SODIUM CHLORIDE 0.9% FLUSH
10.0000 mL | INTRAVENOUS | Status: DC | PRN
  Administered 2019-08-06: 10 mL
  Filled 2019-08-06: qty 10

## 2019-08-06 NOTE — Progress Notes (Signed)
Hematology/Oncology Consult note [facility]  [phone] Fax:([phone]  Patient Care Team: Patient, No Pcp Per as PCP - General (General Practice) Clent Jacks, RN as Oncology Nurse Navigator   Name of the patient: Tristan [name]  [id number]  [birth year]   Date of visit: 08/06/19  Diagnosis- 1. 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 Vicente Males back in July 2018 for abdominal pain. At that time CT showed inflammation of the sigmoid colon extending into the rectum. He was given a course of antibiotics and was asked to follow-up with GI. A colonoscopy was attempted at that time but was very hard to get past the sigmoid colon as it was tight and tortuous. Rectal biopsies at that time were normal. CT colonography and barium enema was recommended but patient was lost to follow-up. He then presented to the emergency room in August 2020 with symptoms of right lower quadrant abdominal pain. CT showed masslike thickening involving the cecum and the ascending colon the terminal ileum with surrounding inflammatory changes differentials included infectious/inflammatory enterocolitis as well as colonic neoplasm. Since it was difficult to do a colonoscopy a barium enema was performed on 01/09/2019 which showed a large irregular apple core lesion involving the lower portion of the right colon. Since it was difficult for the patient to undergo colonoscopy he was referred to Dr. Adora Fridge for definitive surgical management.  Patient underwent right hemicolectomy on 01/30/2019. 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 With invasion of visceral peritoneum with findings of peritonitis adhesions  and abscesses. Metastatic carcinoma in 2 out of 245 regional lymph nodes. Margins were negative. PT4PN1. Appendix encased in adhesions with adjacent abscess. MSI testing showed low probability of MSI high. 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 Ki-67 less than 3%  Adjuvant FOLFOX chemotherapy started on 03/10/2019. Neuroendocrine tumor is being monitored  Interval history- report neuropathy in his fingers is now persistent. It involves all his fingertips. Denies other complaints  ECOG PS- 0 Pain scale- 0   Review of systems- Review of Systems  Constitutional: Negative for chills, fever, malaise/fatigue and weight loss.  HENT: Negative for congestion, ear discharge and nosebleeds.   Eyes: Negative for blurred vision.  Respiratory: Negative for cough, hemoptysis, sputum production, shortness of breath and wheezing.   Cardiovascular: Negative for chest pain, palpitations, orthopnea and claudication.  Gastrointestinal: Negative for abdominal pain, blood in stool, constipation, diarrhea, heartburn, melena, nausea and vomiting.  Genitourinary: Negative for dysuria, flank pain, frequency, hematuria and urgency.  Musculoskeletal: Negative for back pain, joint pain and myalgias.  Skin: Negative for rash.  Neurological: Positive for sensory change (peripheral neuropathy). Negative for dizziness, tingling, focal weakness, seizures, weakness and headaches.  Endo/Heme/Allergies: Does not bruise/bleed easily.  Psychiatric/Behavioral: Negative for depression and suicidal ideas. The patient does not have insomnia.      No Known Allergies   Past Medical History:  Diagnosis Date  . Abdominal pain   . Anemia   . Asthma   . Cancer ([place])   . Colon cancer ([place])   . Family history of breast cancer   . Family history of colon cancer   . GERD (gastroesophageal reflux disease)   . Nausea   . Poor appetite   .  Weight loss      Past Surgical  History:  Procedure Laterality Date  . COLONOSCOPY N/A 01/30/2019   Procedure: COLONOSCOPY;  Surgeon: Jules Husbands, MD;  Location: [facility] ORS;  Service: General;  Laterality: N/A;  . COLONOSCOPY WITH PROPOFOL N/A 08/16/2016   Procedure: COLONOSCOPY WITH PROPOFOL;  Surgeon: [name], MD;  Location: [facility] ENDOSCOPY;  Service: Endoscopy;  Laterality: N/A;  . COLONOSCOPY WITH PROPOFOL N/A 01/30/2019   Procedure: COLONOSCOPY WITH PROPOFOL;  Surgeon: [name], MD;  Location: [facility] ENDOSCOPY;  Service: Gastroenterology;  Laterality: N/A;  TRAVEL CASE TO O.R. - PROCEDURE TO START AT 7:30 AM IN O.R.  . LAPAROSCOPIC RIGHT COLECTOMY N/A 01/30/2019   Procedure: LAPAROSCOPIC HAND ASSISTED RIGHT COLECTOMY CONVERTED TO OPEN PROCEDURE;  Surgeon: Jules Husbands, MD;  Location: [facility] ORS;  Service: General;  Laterality: N/A;  . NO PAST SURGERIES    . PORTACATH PLACEMENT Right 03/04/2019   Procedure: INSERTION PORT-A-CATH;  Surgeon: Jules Husbands, MD;  Location: [facility] ORS;  Service: General;  Laterality: Right;    Social History   Socioeconomic History  . Marital status: Single    Spouse name: Not on file  . Number of children: Not on file  . Years of education: Not on file  . Highest education level: Not on file  Occupational History  . Not on file  Tobacco Use  . Smoking status: Current Every Day Smoker    Packs/day: 0.50    Types: Cigarettes  . Smokeless tobacco: Never Used  Substance and Sexual Activity  . Alcohol use: Not Currently    Comment: off weeks of chemo 1-2 beers  . Drug use: Yes    Types: Marijuana    Comment: last week  . Sexual activity: Yes    Birth control/protection: Condom  Other Topics Concern  . Not on file  Social History Narrative  . Not on file   Social Determinants of Health   Financial Resource Strain:   . Difficulty of Paying Living Expenses:   Food Insecurity:   . Worried About Charity fundraiser in the Last Year:   . Arboriculturist in the Last Year:     Transportation Needs:   . Film/video editor (Medical):   Marland Kitchen Lack of Transportation (Non-Medical):   Physical Activity:   . Days of Exercise per Week:   . Minutes of Exercise per Session:   Stress:   . Feeling of Stress :   Social Connections:   . Frequency of Communication with Friends and Family:   . Frequency of Social Gatherings with Friends and Family:   . Attends Religious Services:   . Active Member of Clubs or Organizations:   . Attends Archivist Meetings:   Marland Kitchen Marital Status:   Intimate Partner Violence:   . Fear of Current or Ex-Partner:   . Emotionally Abused:   Marland Kitchen Physically Abused:   . Sexually Abused:     Family History  Problem Relation Age of Onset  . Diabetes Mother 56       Type 1  . Breast cancer Mother 28  . Healthy Father   . Colon polyps Father   . Ulcerative colitis Paternal Aunt   . Multiple sclerosis Paternal Aunt   . Dementia Paternal Uncle   . Breast cancer Maternal Grandmother 4  . Breast cancer Paternal Grandmother 40  . Rectal cancer Maternal Great-grandmother   . Colon cancer Cousin      Current Outpatient Medications:  .  alum hydroxide-mag trisilicate (GAVISCON) 15-17 MG CHEW chewable tablet, Chew 2 tablets by mouth as needed., Disp: , Rfl:  .  dronabinol (MARINOL) 5 MG capsule, Take 1 capsule (5 mg total) by mouth 2 (two) times daily before a meal., Disp: 28 capsule, Rfl: 1  Physical exam:  Vitals:   08/04/19 0849  BP: 106/65  Pulse: 67  Resp: 16  Temp: 98 F (36.7 C)  TempSrc: Oral  SpO2: 100%  Weight: 163 lb 9.6 oz (74.2 kg)   Physical Exam Constitutional:      General: He is not in acute distress. Skin:    General: Skin is warm and dry.  Neurological:     Mental Status: He is alert and oriented to person, place, and time.      CMP Latest Ref Rng & Units 08/04/2019  Glucose 70 - 99 mg/dL 92  BUN 6 - 20 mg/dL 8  Creatinine 0.61 - 1.24 mg/dL 0.83  Sodium 135 - 145 mmol/L 139  Potassium 3.5 - 5.1  mmol/L 3.9  Chloride 98 - 111 mmol/L 103  CO2 22 - 32 mmol/L 28  Calcium 8.9 - 10.3 mg/dL 9.0  Total Protein 6.5 - 8.1 g/dL 7.5  Total Bilirubin 0.3 - 1.2 mg/dL 0.5  Alkaline Phos 38 - 126 U/L 84  AST 15 - 41 U/L 20  ALT 0 - 44 U/L 16   CBC Latest Ref Rng & Units 08/04/2019  WBC 4.0 - 10.5 K/uL 5.3  Hemoglobin 13.0 - 17.0 g/dL 13.2  Hematocrit 39.0 - 52.0 % 39.3  Platelets 150 - 400 K/uL 160    Assessment and plan- Patient is a 39 y.o. 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  He is here for on treatment assessment prior to cycle 11 of adjuvant FOLFOX chemotherapy  Patient did not receive chemotherapy last week.  His platelet counts have improved from 88-1 60 today.  Patient however reports worsening of his neuropathy which now extends to all his fingers.  He was already receiving oxaliplatin at reduced dose of 65 mg per metered square.  Given his young age and potential for lifelong complications from neuropathy I will hold off on giving him further oxaliplatin for cycle 11 and 12.  He will proceed with cycle 11 today and I will see him back in 2 weeks time with CBC with differential and CMP for cycle 12.  That would be his last cycle.  He will be meeting with radiation oncology to discuss adjuvant radiation given T4 disease.  I will plan to get repeat CT chest abdomen and pelvis with contrast after 12 cycles   Visit Diagnosis 1. Encounter for antineoplastic chemotherapy   2. Chemotherapy-induced thrombocytopenia   3. Chemotherapy-induced peripheral neuropathy ([place])      Dr. Randa Evens, MD, MPH [facility] at [facility] [phone] 08/06/2019 7:19 PM

## 2019-08-11 NOTE — Progress Notes (Signed)
Pharmacist Chemotherapy Monitoring - Follow Up Assessment    I verify that I have reviewed each item in the below checklist:  . Regimen for the patient is scheduled for the appropriate day and plan matches scheduled date. Marland Kitchen Appropriate non-routine labs are ordered dependent on drug ordered. . If applicable, additional medications reviewed and ordered per protocol based on lifetime cumulative doses and/or treatment regimen.   Plan for follow-up and/or issues identified: No . I-vent associated with next due treatment: No . MD and/or nursing notified: No  Duha Abair K 08/11/2019 9:06 AM

## 2019-08-13 ENCOUNTER — Encounter: Payer: Self-pay | Admitting: Radiation Oncology

## 2019-08-13 ENCOUNTER — Ambulatory Visit
Admission: RE | Admit: 2019-08-13 | Discharge: 2019-08-13 | Disposition: A | Discharge status: Home / Self Care | Payer: PRIVATE HEALTH INSURANCE | Source: Ambulatory Visit | Attending: Radiation Oncology | Admitting: Radiation Oncology

## 2019-08-13 ENCOUNTER — Other Ambulatory Visit: Payer: Self-pay

## 2019-08-13 VITALS — BP 131/72 | HR 85 | Temp 97.0°F | Resp 16 | Wt 167.3 lb

## 2019-08-13 DIAGNOSIS — R110 Nausea: Secondary | ICD-10-CM | POA: Insufficient documentation

## 2019-08-13 DIAGNOSIS — R630 Anorexia: Secondary | ICD-10-CM | POA: Diagnosis not present

## 2019-08-13 DIAGNOSIS — C188 Malignant neoplasm of overlapping sites of colon: Secondary | ICD-10-CM | POA: Insufficient documentation

## 2019-08-13 DIAGNOSIS — C786 Secondary malignant neoplasm of retroperitoneum and peritoneum: Secondary | ICD-10-CM | POA: Insufficient documentation

## 2019-08-13 DIAGNOSIS — Z9221 Personal history of antineoplastic chemotherapy: Secondary | ICD-10-CM | POA: Insufficient documentation

## 2019-08-13 DIAGNOSIS — G629 Polyneuropathy, unspecified: Secondary | ICD-10-CM | POA: Insufficient documentation

## 2019-08-13 DIAGNOSIS — K219 Gastro-esophageal reflux disease without esophagitis: Secondary | ICD-10-CM | POA: Diagnosis not present

## 2019-08-13 DIAGNOSIS — R634 Abnormal weight loss: Secondary | ICD-10-CM | POA: Diagnosis not present

## 2019-08-13 DIAGNOSIS — Z803 Family history of malignant neoplasm of breast: Secondary | ICD-10-CM | POA: Insufficient documentation

## 2019-08-13 DIAGNOSIS — Z800 Family history of malignant neoplasm of digestive organs: Secondary | ICD-10-CM | POA: Insufficient documentation

## 2019-08-13 DIAGNOSIS — D649 Anemia, unspecified: Secondary | ICD-10-CM | POA: Insufficient documentation

## 2019-08-13 NOTE — Consult Note (Signed)
NEW PATIENT EVALUATION  Name: Tristan [name]  MRN: [id number]  Date:   08/13/2019     DOB: [birth year]   This 39 y.o. 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  REFERRING PHYSICIAN: Sindy Guadeloupe, MD  CHIEF COMPLAINT:  Chief Complaint  Patient presents with  . Colon Cancer    DIAGNOSIS: The encounter diagnosis was 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[place]).   PREVIOUS INVESTIGATIONS:  CT scans reviewed Clinical notes reviewed Pathology report reviewed  HPI: Patient is a 39 year old male who initially presented back in July 2018 with abdominal pain.  At that time CT scan demonstrated inflammation of the sigmoid colon.  He was treated empirically with antibiotic therapy.  Colonoscopy was attempted although sigmoid: Could not be passed secondary to stricture and tortuous nodes.  Patient was lost to follow-up.  He presented to emergency room in August 2020 again with right lower quadrant pain.  CT scan showed masslike thickening involving the cecum and ascending colon in the terminal ileum with surrounding inflammatory changes.  Barium enema showed a large irregular apple core lesion involving the lower portion of the right colon.  Patient underwent definitive surgery by Dr. Adora Fridge.  Pathology showed a 7.5 cm moderately differentiated adenocarcinoma with invasion of visceral peritoneum with peritonitis adhesions and abscesses.  There was metastatic disease into regional lymph nodes.  Margins were negative.  Patient had appendix encased in adhesions with a adjacent abscess MSI testing showed low probability of MS I high.  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  He has undergone 10 cycles of FOLFOX chemotherapy and has developed some neuropathy.  He is now referred to radiation  oncology for consideration of adjuvant radiation therapy to his prior area of resection.  He is doing fairly well only complaint is mild peripheral neuropathy in his fingers.  His bowels tend to be regular no significant diarrhea.  PLANNED TREATMENT REGIMEN: Radiation therapy to right abdomen  PAST MEDICAL HISTORY:  has a past medical history of Abdominal pain, Anemia, Asthma, Cancer ([place]), Colon cancer ([place]), Family history of breast cancer, Family history of colon cancer, GERD (gastroesophageal reflux disease), Nausea, Poor appetite, and Weight loss.    PAST SURGICAL HISTORY:  Past Surgical History:  Procedure Laterality Date  . COLONOSCOPY N/A 01/30/2019   Procedure: COLONOSCOPY;  Surgeon: Jules Husbands, MD;  Location: [facility] ORS;  Service: General;  Laterality: N/A;  . COLONOSCOPY WITH PROPOFOL N/A 08/16/2016   Procedure: COLONOSCOPY WITH PROPOFOL;  Surgeon: [name], MD;  Location: [facility] ENDOSCOPY;  Service: Endoscopy;  Laterality: N/A;  . COLONOSCOPY WITH PROPOFOL N/A 01/30/2019   Procedure: COLONOSCOPY WITH PROPOFOL;  Surgeon: [name], MD;  Location: [facility] ENDOSCOPY;  Service: Gastroenterology;  Laterality: N/A;  TRAVEL CASE TO O.R. - PROCEDURE TO START AT 7:30 AM IN O.R.  . LAPAROSCOPIC RIGHT COLECTOMY N/A 01/30/2019   Procedure: LAPAROSCOPIC HAND ASSISTED RIGHT COLECTOMY CONVERTED TO OPEN PROCEDURE;  Surgeon: Jules Husbands, MD;  Location: [facility] ORS;  Service: General;  Laterality: N/A;  . NO PAST SURGERIES    . PORTACATH PLACEMENT Right 03/04/2019   Procedure: INSERTION PORT-A-CATH;  Surgeon: Jules Husbands, MD;  Location: [facility] ORS;  Service: General;  Laterality: Right;    FAMILY HISTORY: family history includes Breast cancer (age of onset: 61) in his mother; Breast cancer (  age of onset: 39) in his maternal grandmother and paternal grandmother; Colon cancer in his cousin; Colon polyps in his father; Dementia in his paternal uncle; Diabetes (age of onset: 30) in his mother; Healthy  in his father; Multiple sclerosis in his paternal aunt; Rectal cancer in his maternal great-grandmother; Ulcerative colitis in his paternal aunt.  SOCIAL HISTORY:  reports that he has been smoking cigarettes. He has been smoking about 0.50 packs per day. He has never used smokeless tobacco. He reports previous alcohol use. He reports current drug use. Drug: Marijuana.  ALLERGIES: Patient has no known allergies.  MEDICATIONS:  Current Outpatient Medications  Medication Sig Dispense Refill  . alum hydroxide-mag trisilicate (GAVISCON) 93-23 MG CHEW chewable tablet Chew 2 tablets by mouth as needed.    . dronabinol (MARINOL) 5 MG capsule Take 1 capsule (5 mg total) by mouth 2 (two) times daily before a meal. 28 capsule 1   No current facility-administered medications for this encounter.    ECOG PERFORMANCE STATUS:  0 - Asymptomatic  REVIEW OF SYSTEMS: Patient denies any weight loss, fatigue, weakness, fever, chills or night sweats. Patient denies any loss of vision, blurred vision. Patient denies any ringing  of the ears or hearing loss. No irregular heartbeat. Patient denies heart murmur or history of fainting. Patient denies any chest pain or pain radiating to her upper extremities. Patient denies any shortness of breath, difficulty breathing at night, cough or hemoptysis. Patient denies any swelling in the lower legs. Patient denies any nausea vomiting, vomiting of blood, or coffee ground material in the vomitus. Patient denies any stomach pain. Patient states has had normal bowel movements no significant constipation or diarrhea. Patient denies any dysuria, hematuria or significant nocturia. Patient denies any problems walking, swelling in the joints or loss of balance. Patient denies any skin changes, loss of hair or loss of weight. Patient denies any excessive worrying or anxiety or significant depression. Patient denies any problems with insomnia. Patient denies excessive thirst, polyuria,  polydipsia. Patient denies any swollen glands, patient denies easy bruising or easy bleeding. Patient denies any recent infections, allergies or URI. Patient "s visual fields have not changed significantly in recent time.   PHYSICAL EXAM: BP 131/72 (BP Location: Left Arm, Patient Position: Sitting, Cuff Size: Small)   Pulse 85   Temp (!) 97 F (36.1 C) (Tympanic)   Resp 16   Wt 167 lb 4.8 oz (75.9 kg)   BMI 22.07 kg/m  Midline incision is well-healed.  Abdomen is benign.  Well-developed well-nourished patient in NAD. HEENT reveals PERLA, EOMI, discs not visualized.  Oral cavity is clear. No oral mucosal lesions are identified. Neck is clear without evidence of cervical or supraclavicular adenopathy. Lungs are clear to A&P. Cardiac examination is essentially unremarkable with regular rate and rhythm without murmur rub or thrill. Abdomen is benign with no organomegaly or masses noted. Motor sensory and DTR levels are equal and symmetric in the upper and lower extremities. Cranial nerves II through XII are grossly intact. Proprioception is intact. No peripheral adenopathy or edema is identified. No motor or sensory levels are noted. Crude visual fields are within normal range.  LABORATORY DATA: Pathology report reviewed    RADIOLOGY RESULTS: CT scans in serial fashion reviewed and compatible with above-stated findings   IMPRESSION: Stage III adenocarcinoma of the right colon status post resection for a 7.5 cm mildly differentiated adenocarcinoma with invasion of visceral peritoneum in 39 year old male  PLAN: This time I recommend a course of  adjuvant radiation therapy to area of initial involvement.  I would use his preoperative CT scan and a fusion study during simulation to treat up to 4500 centigrade over 5 weeks.  Risks and benefits of treatment occluding diarrhea fatigue alteration of blood counts possible increased adhesions in the abdominal cavity all were discussed in detail with the  patient.  Patient is reluctant to go undergo any further treatment I have given him a appointment for CT simulation in about 2 weeks.  He will give [place] a decision prior to that whether he will accept treatment.  I would like to take this opportunity to thank you for allowing me to participate in the care of your patient.Noreene Filbert, MD

## 2019-08-18 ENCOUNTER — Inpatient Hospital Stay (HOSPITAL_BASED_OUTPATIENT_CLINIC_OR_DEPARTMENT_OTHER): Payer: PRIVATE HEALTH INSURANCE | Admitting: Oncology

## 2019-08-18 ENCOUNTER — Inpatient Hospital Stay: Payer: PRIVATE HEALTH INSURANCE

## 2019-08-18 ENCOUNTER — Other Ambulatory Visit: Payer: Self-pay

## 2019-08-18 ENCOUNTER — Encounter: Payer: Self-pay | Admitting: Oncology

## 2019-08-18 VITALS — BP 135/87 | HR 71 | Temp 96.3°F | Resp 16 | Wt 165.9 lb

## 2019-08-18 DIAGNOSIS — Z510 Encounter for antineoplastic radiation therapy: Secondary | ICD-10-CM | POA: Diagnosis present

## 2019-08-18 DIAGNOSIS — C182 Malignant neoplasm of ascending colon: Secondary | ICD-10-CM | POA: Diagnosis present

## 2019-08-18 DIAGNOSIS — Z95828 Presence of other vascular implants and grafts: Secondary | ICD-10-CM | POA: Diagnosis not present

## 2019-08-18 DIAGNOSIS — T451X5A Adverse effect of antineoplastic and immunosuppressive drugs, initial encounter: Secondary | ICD-10-CM

## 2019-08-18 DIAGNOSIS — C188 Malignant neoplasm of overlapping sites of colon: Secondary | ICD-10-CM

## 2019-08-18 DIAGNOSIS — G620 Drug-induced polyneuropathy: Secondary | ICD-10-CM

## 2019-08-18 DIAGNOSIS — D6959 Other secondary thrombocytopenia: Secondary | ICD-10-CM | POA: Diagnosis not present

## 2019-08-18 DIAGNOSIS — Z5111 Encounter for antineoplastic chemotherapy: Secondary | ICD-10-CM

## 2019-08-18 LAB — CBC WITH DIFFERENTIAL/PLATELET
Abs Immature Granulocytes: 0.04 10*3/uL (ref 0.00–0.07)
Basophils Absolute: 0 10*3/uL (ref 0.0–0.1)
Basophils Relative: 0 %
Eosinophils Absolute: 0.2 10*3/uL (ref 0.0–0.5)
Eosinophils Relative: 3 %
HCT: 41 % (ref 39.0–52.0)
Hemoglobin: 13.8 g/dL (ref 13.0–17.0)
Immature Granulocytes: 1 %
Lymphocytes Relative: 20 %
Lymphs Abs: 1.7 10*3/uL (ref 0.7–4.0)
MCH: 31.4 pg (ref 26.0–34.0)
MCHC: 33.7 g/dL (ref 30.0–36.0)
MCV: 93.4 fL (ref 80.0–100.0)
Monocytes Absolute: 0.7 10*3/uL (ref 0.1–1.0)
Monocytes Relative: 9 %
Neutro Abs: 5.8 10*3/uL (ref 1.7–7.7)
Neutrophils Relative %: 67 %
Platelets: 102 10*3/uL — ABNORMAL LOW (ref 150–400)
RBC: 4.39 MIL/uL (ref 4.22–5.81)
RDW: 14.1 % (ref 11.5–15.5)
WBC: 8.5 10*3/uL (ref 4.0–10.5)
nRBC: 0 % (ref 0.0–0.2)

## 2019-08-18 LAB — COMPREHENSIVE METABOLIC PANEL
ALT: 15 U/L (ref 0–44)
AST: 21 U/L (ref 15–41)
Albumin: 4.1 g/dL (ref 3.5–5.0)
Alkaline Phosphatase: 87 U/L (ref 38–126)
Anion gap: 9 (ref 5–15)
BUN: 9 mg/dL (ref 6–20)
CO2: 25 mmol/L (ref 22–32)
Calcium: 8.8 mg/dL — ABNORMAL LOW (ref 8.9–10.3)
Chloride: 104 mmol/L (ref 98–111)
Creatinine, Ser: 0.94 mg/dL (ref 0.61–1.24)
GFR calc Af Amer: >60 mL/min (ref >60)
GFR calc non Af Amer: >60 mL/min (ref >60)
Glucose, Bld: 147 mg/dL — ABNORMAL HIGH (ref 70–99)
Potassium: 3.7 mmol/L (ref 3.5–5.1)
Sodium: 138 mmol/L (ref 135–145)
Total Bilirubin: 0.6 mg/dL (ref 0.3–1.2)
Total Protein: 7.4 g/dL (ref 6.5–8.1)

## 2019-08-18 MED ORDER — HEPARIN SOD (PORK) LOCK FLUSH 100 UNIT/ML IV SOLN
500.0000 [IU] | Freq: Once | INTRAVENOUS | Status: AC
  Administered 2019-08-18: 500 [IU] via INTRAVENOUS
  Filled 2019-08-18: qty 5

## 2019-08-18 MED ORDER — SODIUM CHLORIDE 0.9% FLUSH
10.0000 mL | Freq: Once | INTRAVENOUS | Status: AC
  Administered 2019-08-18: 10 mL via INTRAVENOUS
  Filled 2019-08-18: qty 10

## 2019-08-18 MED ORDER — HEPARIN SOD (PORK) LOCK FLUSH 100 UNIT/ML IV SOLN
INTRAVENOUS | Status: AC
  Filled 2019-08-18: qty 5

## 2019-08-18 NOTE — Progress Notes (Signed)
Patient here for oncology follow-up appointment, expresses no complaints or concerns at this time.    

## 2019-08-19 LAB — CEA: CEA: 3.5 ng/mL (ref 0.0–4.7)

## 2019-08-20 ENCOUNTER — Inpatient Hospital Stay: Payer: PRIVATE HEALTH INSURANCE

## 2019-08-20 NOTE — Progress Notes (Signed)
Hematology/Oncology Consult note [facility]  Telephone:([phone] [phone]  Patient Care Team: Patient, No Pcp Per as PCP - General (General Practice) Clent Jacks, RN as Oncology Nurse Navigator Noreene Filbert, MD as Radiation Oncologist (Radiation Oncology)   Name of the patient: Tristan [name]  [id number]  1980/09/14   Date of visit: 08/20/19  Diagnosis- 1. Stage 3 colon cancer 2.Neuroendocrine tumor of the distal ileum s/p resection  Chief complaint/ Reason for visit-on treatment assessment prior to cycle 12 of adjuvant FOLFOX chemotherapy  Heme/Onc history:  patient is a 39 year old male who saw Dr. Vicente Males back in July 2018 for abdominal pain. At that time CT showed inflammation of the sigmoid colon extending into the rectum. He was given a course of antibiotics and was asked to follow-up with GI. A colonoscopy was attempted at that time but was very hard to get past the sigmoid colon as it was tight and tortuous. Rectal biopsies at that time were normal. CT colonography and barium enema was recommended but patient was lost to follow-up. He then presented to the emergency room in August 2020 with symptoms of right lower quadrant abdominal pain. CT showed masslike thickening involving the cecum and the ascending colon the terminal ileum with surrounding inflammatory changes differentials included infectious/inflammatory enterocolitis as well as colonic neoplasm. Since it was difficult to do a colonoscopy a barium enema was performed on 01/09/2019 which showed a large irregular apple core lesion involving the lower portion of the right colon. Since it was difficult for the patient to undergo colonoscopy he was referred to Dr. Adora Fridge for definitive surgical management.  Patient underwent right hemicolectomy on 01/30/2019. 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  With invasion of visceral peritoneum with findings of peritonitis adhesions and abscesses. Metastatic carcinoma in 2 out of 245 regional lymph nodes. Margins were negative. PT4PN1. Appendix encased in adhesions with adjacent abscess. MSI testing showed low probability of MSI high. 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 Ki-67 less than 3%  Adjuvant FOLFOX chemotherapy started on 03/10/2019. Neuroendocrine tumor is being monitored   Interval history-patient did not receive oxaliplatin with cycle 11.  He reports that his neuropathy is getting worse and is now present in both his hands and feet all the time.  He also met with radiation oncology and plans to go through 5 weeks of radiation treatment.  He is adamant about not receiving chemotherapy today and feels like he is done with his treatments  ECOG PS- 0 Pain scale- 0  Review of systems- Review of Systems  Constitutional: Negative for chills, fever, malaise/fatigue and weight loss.  HENT: Negative for congestion, ear discharge and nosebleeds.   Eyes: Negative for blurred vision.  Respiratory: Negative for cough, hemoptysis, sputum production, shortness of breath and wheezing.   Cardiovascular: Negative for chest pain, palpitations, orthopnea and claudication.  Gastrointestinal: Negative for abdominal pain, blood in stool, constipation, diarrhea, heartburn, melena, nausea and vomiting.  Genitourinary: Negative for dysuria, flank pain, frequency, hematuria and urgency.  Musculoskeletal: Negative for back pain, joint pain and myalgias.  Skin: Negative for rash.  Neurological: Positive for sensory change (Peripheral neuropathy). Negative for dizziness, tingling, focal weakness, seizures, weakness and headaches.  Endo/Heme/Allergies: Does not bruise/bleed easily.  Psychiatric/Behavioral: Negative for depression and suicidal ideas. The patient does not have insomnia.        No Known  Allergies   Past Medical History:  Diagnosis Date   Abdominal pain    Anemia    Asthma    Cancer ([place])    Colon cancer ([place])    Family history of breast cancer    Family history of colon cancer    GERD (gastroesophageal reflux disease)    Nausea    Poor appetite    Weight loss      Past Surgical History:  Procedure Laterality Date   COLONOSCOPY N/A 01/30/2019   Procedure: COLONOSCOPY;  Surgeon: Jules Husbands, MD;  Location: [facility] ORS;  Service: General;  Laterality: N/A;   COLONOSCOPY WITH PROPOFOL N/A 08/16/2016   Procedure: COLONOSCOPY WITH PROPOFOL;  Surgeon: [name], MD;  Location: [facility] ENDOSCOPY;  Service: Endoscopy;  Laterality: N/A;   COLONOSCOPY WITH PROPOFOL N/A 01/30/2019   Procedure: COLONOSCOPY WITH PROPOFOL;  Surgeon: [name], MD;  Location: [facility] ENDOSCOPY;  Service: Gastroenterology;  Laterality: N/A;  TRAVEL CASE TO O.R. - PROCEDURE TO START AT 7:30 AM IN O.R.   LAPAROSCOPIC RIGHT COLECTOMY N/A 01/30/2019   Procedure: LAPAROSCOPIC HAND ASSISTED RIGHT COLECTOMY CONVERTED TO OPEN PROCEDURE;  Surgeon: Jules Husbands, MD;  Location: [facility] ORS;  Service: General;  Laterality: N/A;   NO PAST SURGERIES     PORTACATH PLACEMENT Right 03/04/2019   Procedure: INSERTION PORT-A-CATH;  Surgeon: Jules Husbands, MD;  Location: [facility] ORS;  Service: General;  Laterality: Right;    Social History   Socioeconomic History   Marital status: Single    Spouse name: Not on file   Number of children: Not on file   Years of education: Not on file   Highest education level: Not on file  Occupational History   Not on file  Tobacco Use   Smoking status: Current Every Day Smoker    Packs/day: 0.50    Types: Cigarettes   Smokeless tobacco: Never Used  Vaping Use   Vaping Use: Never used  Substance and Sexual Activity   Alcohol use: Not Currently    Comment: off weeks of chemo 1-2 beers   Drug use: Yes    Types: Marijuana    Comment: last week    Sexual activity: Yes    Birth control/protection: Condom  Other Topics Concern   Not on file  Social History Narrative   Not on file   Social Determinants of Health   Financial Resource Strain:    Difficulty of Paying Living Expenses:   Food Insecurity:    Worried About Charity fundraiser in the Last Year:    Arboriculturist in the Last Year:   Transportation Needs:    Film/video editor (Medical):    Lack of Transportation (Non-Medical):   Physical Activity:    Days of Exercise per Week:    Minutes of Exercise per Session:   Stress:    Feeling of Stress :   Social Connections:    Frequency of Communication with Friends and Family:    Frequency of Social Gatherings with Friends and Family:    Attends Religious Services:    Active Member of Clubs or Organizations:    Attends Music therapist:    Marital Status:   Intimate Partner Violence:    Fear of Current or Ex-Partner:    Emotionally Abused:    Physically Abused:    Sexually Abused:     Family History  Problem Relation Age of Onset   Diabetes Mother 73       Type 1   Breast cancer  Mother 63   Healthy Father    Colon polyps Father    Ulcerative colitis Paternal Aunt    Multiple sclerosis Paternal Aunt    Dementia Paternal Uncle    Breast cancer Maternal Grandmother 37   Breast cancer Paternal Grandmother 66   Rectal cancer Maternal Great-grandmother    Colon cancer Cousin      Current Outpatient Medications:    alum hydroxide-mag trisilicate (GAVISCON) 46-50 MG CHEW chewable tablet, Chew 2 tablets by mouth as needed., Disp: , Rfl:    dronabinol (MARINOL) 5 MG capsule, Take 1 capsule (5 mg total) by mouth 2 (two) times daily before a meal. (Patient not taking: Reported on 08/18/2019), Disp: 28 capsule, Rfl: 1  Physical exam:  Vitals:   08/18/19 0927  BP: 135/87  Pulse: 71  Resp: 16  Temp: (!) 96.3 F (35.7 C)  TempSrc: Tympanic  SpO2: 100%  Weight:  165 lb 14.4 oz (75.3 kg)   Physical Exam Constitutional:      General: He is not in acute distress. Cardiovascular:     Rate and Rhythm: Normal rate and regular rhythm.     Heart sounds: Normal heart sounds.  Pulmonary:     Effort: Pulmonary effort is normal.     Breath sounds: Normal breath sounds.  Abdominal:     General: Bowel sounds are normal.     Palpations: Abdomen is soft.  Skin:    General: Skin is warm and dry.  Neurological:     Mental Status: He is alert and oriented to person, place, and time.      CMP Latest Ref Rng & Units 08/18/2019  Glucose 70 - 99 mg/dL 147(H)  BUN 6 - 20 mg/dL 9  Creatinine 0.61 - 1.24 mg/dL 0.94  Sodium 135 - 145 mmol/L 138  Potassium 3.5 - 5.1 mmol/L 3.7  Chloride 98 - 111 mmol/L 104  CO2 22 - 32 mmol/L 25  Calcium 8.9 - 10.3 mg/dL 8.8(L)  Total Protein 6.5 - 8.1 g/dL 7.4  Total Bilirubin 0.3 - 1.2 mg/dL 0.6  Alkaline Phos 38 - 126 U/L 87  AST 15 - 41 U/L 21  ALT 0 - 44 U/L 15   CBC Latest Ref Rng & Units 08/18/2019  WBC 4.0 - 10.5 K/uL 8.5  Hemoglobin 13.0 - 17.0 g/dL 13.8  Hematocrit 39 - 52 % 41.0  Platelets 150 - 400 K/uL 102(L)      Assessment and plan- Patient is a 39 y.o. 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  He is here for on treatment assessment prior to cycle 12 of adjuvant 5-FU chemotherapy  Patient is adamant about not receiving cycle 12 of 5-FU chemotherapy today I explained to him that 5-FU is not the one that is causing neuropathy.  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  Prior to that we have been giving him oxaliplatin at a reduced dose of 65 mg per metered square.  However patient has decided he does not want chemotherapy today.  We will defer the axis port and plan for port removal.  He will proceed with 5 weeks of radiation treatment at this time.  Repeat CT  chest abdomen pelvis with contrast and I will see him after the CT scan.  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  Likely to recover soon.  Continue to monitor  Patient will need completion colonoscopy roughly 3 to 4 months from now after completion of radiation treatment Visit Diagnosis 1. Port-A-Cath in place   2. Chemotherapy-induced peripheral neuropathy ([place])   3. 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[place])   4. Chemotherapy-induced thrombocytopenia      Dr. Randa Evens, MD, MPH [facility] [phone] 08/20/2019 10:03 AM

## 2019-08-27 ENCOUNTER — Ambulatory Visit
Admission: RE | Admit: 2019-08-27 | Discharge: 2019-08-27 | Disposition: A | Discharge status: Home / Self Care | Payer: PRIVATE HEALTH INSURANCE | Source: Ambulatory Visit | Attending: Radiation Oncology | Admitting: Radiation Oncology

## 2019-08-27 DIAGNOSIS — Z510 Encounter for antineoplastic radiation therapy: Secondary | ICD-10-CM | POA: Insufficient documentation

## 2019-08-27 DIAGNOSIS — C182 Malignant neoplasm of ascending colon: Secondary | ICD-10-CM | POA: Insufficient documentation

## 2019-08-28 ENCOUNTER — Other Ambulatory Visit: Payer: Self-pay | Admitting: *Deleted

## 2019-08-28 DIAGNOSIS — C188 Malignant neoplasm of overlapping sites of colon: Secondary | ICD-10-CM

## 2019-08-31 ENCOUNTER — Other Ambulatory Visit: Payer: Self-pay

## 2019-08-31 ENCOUNTER — Telehealth: Payer: Self-pay | Admitting: *Deleted

## 2019-08-31 ENCOUNTER — Encounter: Payer: Self-pay | Admitting: Oncology

## 2019-08-31 ENCOUNTER — Inpatient Hospital Stay: Payer: PRIVATE HEALTH INSURANCE

## 2019-08-31 DIAGNOSIS — Z510 Encounter for antineoplastic radiation therapy: Secondary | ICD-10-CM | POA: Diagnosis not present

## 2019-08-31 DIAGNOSIS — N4889 Other specified disorders of penis: Secondary | ICD-10-CM

## 2019-08-31 LAB — URINALYSIS, COMPLETE (UACMP) WITH MICROSCOPIC
Bacteria, UA: NONE SEEN
Bilirubin Urine: NEGATIVE
Glucose, UA: NEGATIVE mg/dL
Hgb urine dipstick: NEGATIVE
Ketones, ur: NEGATIVE mg/dL
Leukocytes,Ua: NEGATIVE
Nitrite: NEGATIVE
Protein, ur: NEGATIVE mg/dL
Specific Gravity, Urine: 1.014 (ref 1.005–1.030)
pH: 6 (ref 5.0–8.0)

## 2019-08-31 NOTE — Telephone Encounter (Signed)
We can start with UA and urine cx today. If negative- will refer to urology

## 2019-08-31 NOTE — Telephone Encounter (Signed)
Called pt and got vociemail and said that UA was completely normal. Still waiting on the cult. That can take 3 days. Options is that it cont. To bother you can make appt with symptomatic clinic here-just call us or we can send him to urology -that group is comprehensive with urinary issues. I left him message to call me back directly and let me know which he would want to do

## 2019-08-31 NOTE — Telephone Encounter (Signed)
Patient called reporting that he is having penile pain every time he voids or coughs. He is requesting something be done for it stating he doesn't know if he has a stone or what. Please advise

## 2019-09-01 ENCOUNTER — Other Ambulatory Visit: Payer: Self-pay

## 2019-09-01 ENCOUNTER — Ambulatory Visit
Admission: RE | Admit: 2019-09-01 | Discharge: 2019-09-01 | Disposition: A | Discharge status: Home / Self Care | Payer: PRIVATE HEALTH INSURANCE | Source: Ambulatory Visit | Attending: Oncology | Admitting: Oncology

## 2019-09-01 ENCOUNTER — Encounter: Payer: Self-pay | Admitting: Oncology

## 2019-09-01 DIAGNOSIS — C188 Malignant neoplasm of overlapping sites of colon: Secondary | ICD-10-CM

## 2019-09-01 LAB — URINE CULTURE: Culture: NO GROWTH

## 2019-09-01 MED ORDER — IOHEXOL 300 MG/ML  SOLN
100.0000 mL | Freq: Once | INTRAMUSCULAR | Status: AC | PRN
  Administered 2019-09-01: 100 mL via INTRAVENOUS

## 2019-09-03 ENCOUNTER — Ambulatory Visit: Admission: RE | Admit: 2019-09-03 | Payer: PRIVATE HEALTH INSURANCE | Source: Ambulatory Visit

## 2019-09-03 DIAGNOSIS — Z510 Encounter for antineoplastic radiation therapy: Secondary | ICD-10-CM | POA: Diagnosis not present

## 2019-09-07 ENCOUNTER — Ambulatory Visit
Admission: RE | Admit: 2019-09-07 | Discharge: 2019-09-07 | Disposition: A | Discharge status: Home / Self Care | Payer: PRIVATE HEALTH INSURANCE | Source: Ambulatory Visit | Attending: Radiation Oncology | Admitting: Radiation Oncology

## 2019-09-07 DIAGNOSIS — Z510 Encounter for antineoplastic radiation therapy: Secondary | ICD-10-CM | POA: Diagnosis not present

## 2019-09-08 ENCOUNTER — Inpatient Hospital Stay: Payer: PRIVATE HEALTH INSURANCE

## 2019-09-08 ENCOUNTER — Inpatient Hospital Stay (HOSPITAL_BASED_OUTPATIENT_CLINIC_OR_DEPARTMENT_OTHER): Payer: PRIVATE HEALTH INSURANCE | Admitting: Oncology

## 2019-09-08 ENCOUNTER — Other Ambulatory Visit: Payer: Self-pay

## 2019-09-08 ENCOUNTER — Encounter: Payer: Self-pay | Admitting: Oncology

## 2019-09-08 ENCOUNTER — Inpatient Hospital Stay: Payer: PRIVATE HEALTH INSURANCE | Admitting: Oncology

## 2019-09-08 ENCOUNTER — Ambulatory Visit
Admission: RE | Admit: 2019-09-08 | Discharge: 2019-09-08 | Disposition: A | Discharge status: Home / Self Care | Payer: PRIVATE HEALTH INSURANCE | Source: Ambulatory Visit | Attending: Radiation Oncology | Admitting: Radiation Oncology

## 2019-09-08 VITALS — BP 132/81 | HR 70 | Temp 96.7°F | Resp 16 | Wt 165.0 lb

## 2019-09-08 DIAGNOSIS — G620 Drug-induced polyneuropathy: Secondary | ICD-10-CM

## 2019-09-08 DIAGNOSIS — Z510 Encounter for antineoplastic radiation therapy: Secondary | ICD-10-CM | POA: Diagnosis not present

## 2019-09-08 DIAGNOSIS — Z85038 Personal history of other malignant neoplasm of large intestine: Secondary | ICD-10-CM

## 2019-09-08 DIAGNOSIS — C188 Malignant neoplasm of overlapping sites of colon: Secondary | ICD-10-CM

## 2019-09-08 DIAGNOSIS — D6959 Other secondary thrombocytopenia: Secondary | ICD-10-CM | POA: Diagnosis not present

## 2019-09-08 DIAGNOSIS — T451X5A Adverse effect of antineoplastic and immunosuppressive drugs, initial encounter: Secondary | ICD-10-CM

## 2019-09-08 DIAGNOSIS — Z08 Encounter for follow-up examination after completed treatment for malignant neoplasm: Secondary | ICD-10-CM | POA: Diagnosis not present

## 2019-09-08 LAB — COMPREHENSIVE METABOLIC PANEL
ALT: 16 U/L (ref 0–44)
AST: 20 U/L (ref 15–41)
Albumin: 4.4 g/dL (ref 3.5–5.0)
Alkaline Phosphatase: 93 U/L (ref 38–126)
Anion gap: 8 (ref 5–15)
BUN: 9 mg/dL (ref 6–20)
CO2: 29 mmol/L (ref 22–32)
Calcium: 9.5 mg/dL (ref 8.9–10.3)
Chloride: 99 mmol/L (ref 98–111)
Creatinine, Ser: 0.93 mg/dL (ref 0.61–1.24)
GFR calc Af Amer: >60 mL/min (ref >60)
GFR calc non Af Amer: >60 mL/min (ref >60)
Glucose, Bld: 120 mg/dL — ABNORMAL HIGH (ref 70–99)
Potassium: 4.3 mmol/L (ref 3.5–5.1)
Sodium: 136 mmol/L (ref 135–145)
Total Bilirubin: 0.7 mg/dL (ref 0.3–1.2)
Total Protein: 8.1 g/dL (ref 6.5–8.1)

## 2019-09-08 LAB — CBC WITH DIFFERENTIAL/PLATELET
Abs Immature Granulocytes: 0.03 10*3/uL (ref 0.00–0.07)
Basophils Absolute: 0.1 10*3/uL (ref 0.0–0.1)
Basophils Relative: 1 %
Eosinophils Absolute: 0.1 10*3/uL (ref 0.0–0.5)
Eosinophils Relative: 2 %
HCT: 41.2 % (ref 39.0–52.0)
Hemoglobin: 14.1 g/dL (ref 13.0–17.0)
Immature Granulocytes: 0 %
Lymphocytes Relative: 29 %
Lymphs Abs: 2 10*3/uL (ref 0.7–4.0)
MCH: 31.5 pg (ref 26.0–34.0)
MCHC: 34.2 g/dL (ref 30.0–36.0)
MCV: 92.2 fL (ref 80.0–100.0)
Monocytes Absolute: 0.6 10*3/uL (ref 0.1–1.0)
Monocytes Relative: 9 %
Neutro Abs: 4 10*3/uL (ref 1.7–7.7)
Neutrophils Relative %: 59 %
Platelets: 155 10*3/uL (ref 150–400)
RBC: 4.47 MIL/uL (ref 4.22–5.81)
RDW: 13.2 % (ref 11.5–15.5)
WBC: 6.8 10*3/uL (ref 4.0–10.5)
nRBC: 0 % (ref 0.0–0.2)

## 2019-09-08 NOTE — Progress Notes (Signed)
Hematology/Oncology Consult note [facility]  [phone] Fax:([phone]  Patient Care Team: Patient, No Pcp Per as PCP - General (General Practice) Clent Jacks, RN as Oncology Nurse Navigator Noreene Filbert, MD as Radiation Oncologist (Radiation Oncology)   Name of the patient: Tristan [name]  [id number]  03/24/1980   Date of visit: 09/08/19  Diagnosis- 1. 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 Vicente Males back in July 2018 for abdominal pain. At that time CT showed inflammation of the sigmoid colon extending into the rectum. He was given a course of antibiotics and was asked to follow-up with GI. A colonoscopy was attempted at that time but was very hard to get past the sigmoid colon as it was tight and tortuous. Rectal biopsies at that time were normal. CT colonography and barium enema was recommended but patient was lost to follow-up. He then presented to the emergency room in August 2020 with symptoms of right lower quadrant abdominal pain. CT showed masslike thickening involving the cecum and the ascending colon the terminal ileum with surrounding inflammatory changes differentials included infectious/inflammatory enterocolitis as well as colonic neoplasm. Since it was difficult to do a colonoscopy a barium enema was performed on 01/09/2019 which showed a large irregular apple core lesion involving the lower portion of the right colon. Since it was difficult for the patient to undergo colonoscopy he was referred to Dr. Adora Fridge for definitive surgical management.  Patient underwent right hemicolectomy on 01/30/2019. Final pathology showed 2 distinct etiologies the first 1 was an adenocarcinoma 7.5 cm moderately  differentiated. With invasion of visceral peritoneum with findings of peritonitis adhesions and abscesses. Metastatic carcinoma in 2 out of 245 regional lymph nodes. Margins were negative. PT4PN1. Appendix encased in adhesions with adjacent abscess. MSI testing showed low probability of MSI high. 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 Ki-67 less than 3%  Adjuvant FOLFOX chemotherapy completed on 08/04/2019.  Patient is undergoing adjuvant radiation treatment as well given T4 lesion. Neuroendocrine tumor is being monitored   Interval history-reports tingling numbness in his hands and feet is getting better.  Does report shooting pain at times when he stretches both his arms which extends from the tip of his fingers all the way up to his neck.  He has started his new job.  Denies any abdominal pain or changes in his bowel habits.  ECOG PS- 0 Pain scale- 0   Review of systems- Review of Systems  Constitutional: Negative for chills, fever, malaise/fatigue and weight loss.  HENT: Negative for congestion, ear discharge and nosebleeds.   Eyes: Negative for blurred vision.  Respiratory: Negative for cough, hemoptysis, sputum production, shortness of breath and wheezing.   Cardiovascular: Negative for chest pain, palpitations, orthopnea and claudication.  Gastrointestinal: Negative for abdominal pain, blood in stool, constipation, diarrhea, heartburn, melena, nausea and vomiting.  Genitourinary: Negative for dysuria, flank pain, frequency, hematuria and urgency.  Musculoskeletal: Negative for back pain, joint pain and myalgias.  Skin: Negative for rash.  Neurological: Positive for sensory change (Peripheral neuropathy). Negative for dizziness, tingling, focal weakness, seizures, weakness and headaches.  Endo/Heme/Allergies: Does not bruise/bleed easily.  Psychiatric/Behavioral: Negative for depression and suicidal ideas. The patient does not have  insomnia.       No Known Allergies  Past Medical History:  Diagnosis Date  . Abdominal pain   . Anemia   . Asthma   . Colon cancer ([place])   . Family history of breast cancer   . Family history of colon cancer   . GERD (gastroesophageal reflux disease)   . Nausea   . Poor appetite   . Weight loss      Past Surgical History:  Procedure Laterality Date  . COLONOSCOPY N/A 01/30/2019   Procedure: COLONOSCOPY;  Surgeon: Jules Husbands, MD;  Location: [facility] ORS;  Service: General;  Laterality: N/A;  . COLONOSCOPY WITH PROPOFOL N/A 08/16/2016   Procedure: COLONOSCOPY WITH PROPOFOL;  Surgeon: [name], MD;  Location: [facility] ENDOSCOPY;  Service: Endoscopy;  Laterality: N/A;  . COLONOSCOPY WITH PROPOFOL N/A 01/30/2019   Procedure: COLONOSCOPY WITH PROPOFOL;  Surgeon: [name], MD;  Location: [facility] ENDOSCOPY;  Service: Gastroenterology;  Laterality: N/A;  TRAVEL CASE TO O.R. - PROCEDURE TO START AT 7:30 AM IN O.R.  . LAPAROSCOPIC RIGHT COLECTOMY N/A 01/30/2019   Procedure: LAPAROSCOPIC HAND ASSISTED RIGHT COLECTOMY CONVERTED TO OPEN PROCEDURE;  Surgeon: Jules Husbands, MD;  Location: [facility] ORS;  Service: General;  Laterality: N/A;  . NO PAST SURGERIES    . PORTACATH PLACEMENT Right 03/04/2019   Procedure: INSERTION PORT-A-CATH;  Surgeon: Jules Husbands, MD;  Location: [facility] ORS;  Service: General;  Laterality: Right;    Social History   Socioeconomic History  . Marital status: Single    Spouse name: Not on file  . Number of children: Not on file  . Years of education: Not on file  . Highest education level: Not on file  Occupational History  . Not on file  Tobacco Use  . Smoking status: Current Every Day Smoker    Packs/day: 0.50    Types: Cigarettes  . Smokeless tobacco: Never Used  Vaping Use  . Vaping Use: Never used  Substance and Sexual Activity  . Alcohol use: Not Currently    Comment: off weeks of chemo 1-2 beers  . Drug use: Yes    Types: Marijuana    Comment:  last week  . Sexual activity: Yes    Birth control/protection: Condom  Other Topics Concern  . Not on file  Social History Narrative  . Not on file   Social Determinants of Health   Financial Resource Strain:   . Difficulty of Paying Living Expenses:   Food Insecurity:   . Worried About Charity fundraiser in the Last Year:   . Arboriculturist in the Last Year:   Transportation Needs:   . Film/video editor (Medical):   Marland Kitchen Lack of Transportation (Non-Medical):   Physical Activity:   . Days of Exercise per Week:   . Minutes of Exercise per Session:   Stress:   . Feeling of Stress :   Social Connections:   . Frequency of Communication with Friends and Family:   . Frequency of Social Gatherings with Friends and Family:   . Attends Religious Services:   . Active Member of Clubs or Organizations:   . Attends Archivist Meetings:   Marland Kitchen Marital Status:   Intimate Partner Violence:   . Fear of Current or Ex-Partner:   . Emotionally Abused:   Marland Kitchen Physically Abused:   . Sexually Abused:     Family History  Problem Relation Age of Onset  . Diabetes Mother 55       Type 1  . Breast cancer Mother  74  . Healthy Father   . Colon polyps Father   . Ulcerative colitis Paternal Aunt   . Multiple sclerosis Paternal Aunt   . Dementia Paternal Uncle   . Breast cancer Maternal Grandmother 40  . Breast cancer Paternal Grandmother 70  . Rectal cancer Maternal Great-grandmother   . Colon cancer Cousin      Current Outpatient Medications:  .  alum hydroxide-mag trisilicate (GAVISCON) 27-06 MG CHEW chewable tablet, Chew 2 tablets by mouth as needed., Disp: , Rfl:  .  dronabinol (MARINOL) 5 MG capsule, Take 1 capsule (5 mg total) by mouth 2 (two) times daily before a meal. (Patient not taking: Reported on 08/18/2019), Disp: 28 capsule, Rfl: 1  Physical exam:  Vitals:   09/08/19 1644  BP: 132/81  Pulse: 70  Resp: 16  Temp: (!) 96.7 F (35.9 C)  SpO2: 100%  Weight: 165 lb  (74.8 kg)   Physical Exam Constitutional:      General: He is not in acute distress. Pulmonary:     Effort: Pulmonary effort is normal.  Skin:    General: Skin is warm and dry.  Neurological:     Mental Status: He is alert and oriented to person, place, and time.      CMP Latest Ref Rng & Units 08/18/2019  Glucose 70 - 99 mg/dL 147(H)  BUN 6 - 20 mg/dL 9  Creatinine 0.61 - 1.24 mg/dL 0.94  Sodium 135 - 145 mmol/L 138  Potassium 3.5 - 5.1 mmol/L 3.7  Chloride 98 - 111 mmol/L 104  CO2 22 - 32 mmol/L 25  Calcium 8.9 - 10.3 mg/dL 8.8(L)  Total Protein 6.5 - 8.1 g/dL 7.4  Total Bilirubin 0.3 - 1.2 mg/dL 0.6  Alkaline Phos 38 - 126 U/L 87  AST 15 - 41 U/L 21  ALT 0 - 44 U/L 15   CBC Latest Ref Rng & Units 08/18/2019  WBC 4.0 - 10.5 K/uL 8.5  Hemoglobin 13.0 - 17.0 g/dL 13.8  Hematocrit 39 - 52 % 41.0  Platelets 150 - 400 K/uL 102(L)    No images are attached to the encounter.  CT Chest W Contrast  Result Date: 09/01/2019 CLINICAL DATA:  Restaging colon cancer EXAM: CT CHEST, ABDOMEN, AND PELVIS WITH CONTRAST TECHNIQUE: Multidetector CT imaging of the chest, abdomen and pelvis was performed following the standard protocol during bolus administration of intravenous contrast. CONTRAST:  140m OMNIPAQUE IOHEXOL 300 MG/ML SOLN, additional oral enteric contrast COMPARISON:  02/23/2019 FINDINGS: CT CHEST FINDINGS Cardiovascular: Right chest port catheter. Normal heart size. No pericardial effusion. Mediastinum/Nodes: No enlarged mediastinal, hilar, or axillary lymph nodes. Thyroid gland, trachea, and esophagus demonstrate no significant findings. Lungs/Pleura: Numerous very tiny centrilobular pulmonary nodules, most conspicuous in the bilateral lung apices. No pleural effusion or pneumothorax. Musculoskeletal: No chest wall mass or suspicious bone lesions identified. CT ABDOMEN PELVIS FINDINGS Hepatobiliary: No solid liver abnormality is seen. Hepatic steatosis. No gallstones, gallbladder wall  thickening, or biliary dilatation. Pancreas: Unremarkable. No pancreatic ductal dilatation or surrounding inflammatory changes. Spleen: Normal in size without significant abnormality. Adrenals/Urinary Tract: Adrenal glands are unremarkable. Kidneys are normal, without renal calculi, solid lesion, or hydronephrosis. Bladder is unremarkable. Stomach/Bowel: Stomach is within normal limits. Redemonstrated postoperative findings of right colectomy. No evidence of bowel wall thickening, distention, or inflammatory changes. Vascular/Lymphatic: No significant vascular findings are present. No enlarged abdominal or pelvic lymph nodes. Reproductive: No mass or other abnormality. Other: No abdominal wall hernia or abnormality. No abdominopelvic ascites. There may  be a small residual fluid collection in the right lower quadrant adjacent to the bladder dome measuring 1.5 cm (series 2, image 118). Musculoskeletal: No acute or significant osseous findings. IMPRESSION: 1. Redemonstrated postoperative findings of right colectomy. 2. There may be a small residual fluid collection in the right lower quadrant adjacent to the bladder dome measuring 1.5 cm. This is likely a small postoperative seroma or lymphocele; postoperative fluid and inflammation seen on prior examination are otherwise resolved. Attention on follow-up. 3. No evidence of lymphadenopathy or definite findings of metastatic disease. 4. Numerous tiny centrilobular pulmonary nodules, likely smoking-related respiratory bronchiolitis. 5. Hepatic steatosis. Electronically Signed   By: Eddie Candle M.D.   On: 09/01/2019 15:42   CT Abdomen Pelvis W Contrast  Result Date: 09/01/2019 CLINICAL DATA:  Restaging colon cancer EXAM: CT CHEST, ABDOMEN, AND PELVIS WITH CONTRAST TECHNIQUE: Multidetector CT imaging of the chest, abdomen and pelvis was performed following the standard protocol during bolus administration of intravenous contrast. CONTRAST:  136m OMNIPAQUE IOHEXOL 300  MG/ML SOLN, additional oral enteric contrast COMPARISON:  02/23/2019 FINDINGS: CT CHEST FINDINGS Cardiovascular: Right chest port catheter. Normal heart size. No pericardial effusion. Mediastinum/Nodes: No enlarged mediastinal, hilar, or axillary lymph nodes. Thyroid gland, trachea, and esophagus demonstrate no significant findings. Lungs/Pleura: Numerous very tiny centrilobular pulmonary nodules, most conspicuous in the bilateral lung apices. No pleural effusion or pneumothorax. Musculoskeletal: No chest wall mass or suspicious bone lesions identified. CT ABDOMEN PELVIS FINDINGS Hepatobiliary: No solid liver abnormality is seen. Hepatic steatosis. No gallstones, gallbladder wall thickening, or biliary dilatation. Pancreas: Unremarkable. No pancreatic ductal dilatation or surrounding inflammatory changes. Spleen: Normal in size without significant abnormality. Adrenals/Urinary Tract: Adrenal glands are unremarkable. Kidneys are normal, without renal calculi, solid lesion, or hydronephrosis. Bladder is unremarkable. Stomach/Bowel: Stomach is within normal limits. Redemonstrated postoperative findings of right colectomy. No evidence of bowel wall thickening, distention, or inflammatory changes. Vascular/Lymphatic: No significant vascular findings are present. No enlarged abdominal or pelvic lymph nodes. Reproductive: No mass or other abnormality. Other: No abdominal wall hernia or abnormality. No abdominopelvic ascites. There may be a small residual fluid collection in the right lower quadrant adjacent to the bladder dome measuring 1.5 cm (series 2, image 118). Musculoskeletal: No acute or significant osseous findings. IMPRESSION: 1. Redemonstrated postoperative findings of right colectomy. 2. There may be a small residual fluid collection in the right lower quadrant adjacent to the bladder dome measuring 1.5 cm. This is likely a small postoperative seroma or lymphocele; postoperative fluid and inflammation seen on  prior examination are otherwise resolved. Attention on follow-up. 3. No evidence of lymphadenopathy or definite findings of metastatic disease. 4. Numerous tiny centrilobular pulmonary nodules, likely smoking-related respiratory bronchiolitis. 5. Hepatic steatosis. Electronically Signed   By: AEddie CandleM.D.   On: 09/01/2019 15:42     Assessment and plan- Patient is a 39y.o. 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   Is s/p 11 cycles of FOLFOX chemotherapy adjuvantly and currently undergoing adjuvant radiation treatment.  He is here to discuss CT scan results and further management  Colon cancer:I have reviewed CT chest abdomen and pelvis images independently and discussed findings with the patient.  Scans presently showed no concerning findings of recurrence or metastatic disease.  He has small fluid collection near the bladder dome likely a postoperative seroma which will be monitored with a subsequent CT scan in 6 months.  Noted  to have hepatic steatosis which was not seen on prior CT scans.  Continue to monitor.  Patient will be completing adjuvant radiation treatment on 10/12/2019.  I will see him back in 3 months with CBC with differential CMP and CEA.  Patient did not have a completion colonoscopy back in November 2020 and will need to be scheduled for one upon completion of radiation treatment  Chemo-induced peripheral neuropathy: Appears to be improving.  He is not on any medications at this time.  Patient does report shooting pain that travels from his fingers to neck when he stretches his arms.  If symptoms continue he may need MRI cervical spine.  This does not appear typical for chemo-induced neuropathy     Visit Diagnosis 1. Encounter for follow-up surveillance of colon cancer   2. Chemotherapy-induced peripheral neuropathy ([place])   3. Chemotherapy-induced  thrombocytopenia      Dr. Randa Evens, MD, MPH [facility] [phone] 09/08/2019 3:49 PM

## 2019-09-09 ENCOUNTER — Ambulatory Visit
Admission: RE | Admit: 2019-09-09 | Discharge: 2019-09-09 | Disposition: A | Discharge status: Home / Self Care | Payer: PRIVATE HEALTH INSURANCE | Source: Ambulatory Visit | Attending: Radiation Oncology | Admitting: Radiation Oncology

## 2019-09-09 DIAGNOSIS — Z510 Encounter for antineoplastic radiation therapy: Secondary | ICD-10-CM | POA: Diagnosis not present

## 2019-09-10 ENCOUNTER — Ambulatory Visit
Admission: RE | Admit: 2019-09-10 | Discharge: 2019-09-10 | Disposition: A | Discharge status: Home / Self Care | Payer: PRIVATE HEALTH INSURANCE | Source: Ambulatory Visit | Attending: Radiation Oncology | Admitting: Radiation Oncology

## 2019-09-10 DIAGNOSIS — Z510 Encounter for antineoplastic radiation therapy: Secondary | ICD-10-CM | POA: Insufficient documentation

## 2019-09-10 DIAGNOSIS — C19 Malignant neoplasm of rectosigmoid junction: Secondary | ICD-10-CM | POA: Diagnosis present

## 2019-09-10 DIAGNOSIS — C182 Malignant neoplasm of ascending colon: Secondary | ICD-10-CM | POA: Insufficient documentation

## 2019-09-10 DIAGNOSIS — C7B01 Secondary carcinoid tumors of distant lymph nodes: Secondary | ICD-10-CM | POA: Diagnosis not present

## 2019-09-10 DIAGNOSIS — C7A012 Malignant carcinoid tumor of the ileum: Secondary | ICD-10-CM | POA: Diagnosis not present

## 2019-09-11 ENCOUNTER — Ambulatory Visit
Admission: RE | Admit: 2019-09-11 | Discharge: 2019-09-11 | Disposition: A | Discharge status: Home / Self Care | Payer: PRIVATE HEALTH INSURANCE | Source: Ambulatory Visit | Attending: Radiation Oncology | Admitting: Radiation Oncology

## 2019-09-11 DIAGNOSIS — C7B01 Secondary carcinoid tumors of distant lymph nodes: Secondary | ICD-10-CM | POA: Diagnosis not present

## 2019-09-15 ENCOUNTER — Inpatient Hospital Stay: Payer: PRIVATE HEALTH INSURANCE

## 2019-09-15 ENCOUNTER — Ambulatory Visit
Admission: RE | Admit: 2019-09-15 | Discharge: 2019-09-15 | Disposition: A | Discharge status: Home / Self Care | Payer: PRIVATE HEALTH INSURANCE | Source: Ambulatory Visit | Attending: Radiation Oncology | Admitting: Radiation Oncology

## 2019-09-15 DIAGNOSIS — C7B01 Secondary carcinoid tumors of distant lymph nodes: Secondary | ICD-10-CM | POA: Diagnosis not present

## 2019-09-16 ENCOUNTER — Ambulatory Visit
Admission: RE | Admit: 2019-09-16 | Discharge: 2019-09-16 | Disposition: A | Discharge status: Home / Self Care | Payer: PRIVATE HEALTH INSURANCE | Source: Ambulatory Visit | Attending: Radiation Oncology | Admitting: Radiation Oncology

## 2019-09-16 DIAGNOSIS — C7B01 Secondary carcinoid tumors of distant lymph nodes: Secondary | ICD-10-CM | POA: Diagnosis not present

## 2019-09-17 ENCOUNTER — Ambulatory Visit
Admission: RE | Admit: 2019-09-17 | Discharge: 2019-09-17 | Disposition: A | Discharge status: Home / Self Care | Payer: PRIVATE HEALTH INSURANCE | Source: Ambulatory Visit | Attending: Radiation Oncology | Admitting: Radiation Oncology

## 2019-09-17 DIAGNOSIS — C7B01 Secondary carcinoid tumors of distant lymph nodes: Secondary | ICD-10-CM | POA: Diagnosis not present

## 2019-09-18 ENCOUNTER — Ambulatory Visit
Admission: RE | Admit: 2019-09-18 | Discharge: 2019-09-18 | Disposition: A | Discharge status: Home / Self Care | Payer: PRIVATE HEALTH INSURANCE | Source: Ambulatory Visit | Attending: Radiation Oncology | Admitting: Radiation Oncology

## 2019-09-18 DIAGNOSIS — C7B01 Secondary carcinoid tumors of distant lymph nodes: Secondary | ICD-10-CM | POA: Diagnosis not present

## 2019-09-21 ENCOUNTER — Ambulatory Visit
Admission: RE | Admit: 2019-09-21 | Discharge: 2019-09-21 | Disposition: A | Discharge status: Home / Self Care | Payer: PRIVATE HEALTH INSURANCE | Source: Ambulatory Visit | Attending: Radiation Oncology | Admitting: Radiation Oncology

## 2019-09-21 DIAGNOSIS — C7B01 Secondary carcinoid tumors of distant lymph nodes: Secondary | ICD-10-CM | POA: Diagnosis not present

## 2019-09-22 ENCOUNTER — Inpatient Hospital Stay: Payer: PRIVATE HEALTH INSURANCE | Attending: Oncology

## 2019-09-22 ENCOUNTER — Ambulatory Visit
Admission: RE | Admit: 2019-09-22 | Discharge: 2019-09-22 | Disposition: A | Discharge status: Home / Self Care | Payer: PRIVATE HEALTH INSURANCE | Source: Ambulatory Visit | Attending: Radiation Oncology | Admitting: Radiation Oncology

## 2019-09-22 DIAGNOSIS — C7B01 Secondary carcinoid tumors of distant lymph nodes: Secondary | ICD-10-CM | POA: Diagnosis not present

## 2019-09-22 DIAGNOSIS — C7A012 Malignant carcinoid tumor of the ileum: Secondary | ICD-10-CM | POA: Insufficient documentation

## 2019-09-22 DIAGNOSIS — C19 Malignant neoplasm of rectosigmoid junction: Secondary | ICD-10-CM | POA: Insufficient documentation

## 2019-09-23 ENCOUNTER — Ambulatory Visit
Admission: RE | Admit: 2019-09-23 | Discharge: 2019-09-23 | Disposition: A | Discharge status: Home / Self Care | Payer: PRIVATE HEALTH INSURANCE | Source: Ambulatory Visit | Attending: Radiation Oncology | Admitting: Radiation Oncology

## 2019-09-23 DIAGNOSIS — C7B01 Secondary carcinoid tumors of distant lymph nodes: Secondary | ICD-10-CM | POA: Diagnosis not present

## 2019-09-24 ENCOUNTER — Ambulatory Visit
Admission: RE | Admit: 2019-09-24 | Discharge: 2019-09-24 | Disposition: A | Discharge status: Home / Self Care | Payer: PRIVATE HEALTH INSURANCE | Source: Ambulatory Visit | Attending: Radiation Oncology | Admitting: Radiation Oncology

## 2019-09-24 DIAGNOSIS — C7B01 Secondary carcinoid tumors of distant lymph nodes: Secondary | ICD-10-CM | POA: Diagnosis not present

## 2019-09-25 ENCOUNTER — Ambulatory Visit
Admission: RE | Admit: 2019-09-25 | Discharge: 2019-09-25 | Disposition: A | Discharge status: Home / Self Care | Payer: PRIVATE HEALTH INSURANCE | Source: Ambulatory Visit | Attending: Radiation Oncology | Admitting: Radiation Oncology

## 2019-09-25 DIAGNOSIS — C7B01 Secondary carcinoid tumors of distant lymph nodes: Secondary | ICD-10-CM | POA: Diagnosis not present

## 2019-09-28 ENCOUNTER — Ambulatory Visit
Admission: RE | Admit: 2019-09-28 | Discharge: 2019-09-28 | Disposition: A | Discharge status: Home / Self Care | Payer: PRIVATE HEALTH INSURANCE | Source: Ambulatory Visit | Attending: Radiation Oncology | Admitting: Radiation Oncology

## 2019-09-28 DIAGNOSIS — C7B01 Secondary carcinoid tumors of distant lymph nodes: Secondary | ICD-10-CM | POA: Diagnosis not present

## 2019-09-29 ENCOUNTER — Other Ambulatory Visit: Payer: Self-pay

## 2019-09-29 ENCOUNTER — Ambulatory Visit
Admission: RE | Admit: 2019-09-29 | Discharge: 2019-09-29 | Disposition: A | Discharge status: Home / Self Care | Payer: PRIVATE HEALTH INSURANCE | Source: Ambulatory Visit | Attending: Radiation Oncology | Admitting: Radiation Oncology

## 2019-09-29 ENCOUNTER — Inpatient Hospital Stay: Payer: PRIVATE HEALTH INSURANCE

## 2019-09-29 DIAGNOSIS — C7B01 Secondary carcinoid tumors of distant lymph nodes: Secondary | ICD-10-CM | POA: Diagnosis not present

## 2019-09-29 DIAGNOSIS — C188 Malignant neoplasm of overlapping sites of colon: Secondary | ICD-10-CM

## 2019-09-29 LAB — COMPREHENSIVE METABOLIC PANEL
ALT: 14 U/L (ref 0–44)
AST: 21 U/L (ref 15–41)
Albumin: 3.8 g/dL (ref 3.5–5.0)
Alkaline Phosphatase: 76 U/L (ref 38–126)
Anion gap: 6 (ref 5–15)
BUN: 8 mg/dL (ref 6–20)
CO2: 29 mmol/L (ref 22–32)
Calcium: 9.1 mg/dL (ref 8.9–10.3)
Chloride: 106 mmol/L (ref 98–111)
Creatinine, Ser: 0.87 mg/dL (ref 0.61–1.24)
GFR calc Af Amer: >60 mL/min (ref >60)
GFR calc non Af Amer: >60 mL/min (ref >60)
Glucose, Bld: 78 mg/dL (ref 70–99)
Potassium: 5 mmol/L (ref 3.5–5.1)
Sodium: 141 mmol/L (ref 135–145)
Total Bilirubin: 0.4 mg/dL (ref 0.3–1.2)
Total Protein: 6.8 g/dL (ref 6.5–8.1)

## 2019-09-29 LAB — CBC WITH DIFFERENTIAL/PLATELET
Abs Immature Granulocytes: 0.02 10*3/uL (ref 0.00–0.07)
Basophils Absolute: 0 10*3/uL (ref 0.0–0.1)
Basophils Relative: 1 %
Eosinophils Absolute: 0.2 10*3/uL (ref 0.0–0.5)
Eosinophils Relative: 4 %
HCT: 39.6 % (ref 39.0–52.0)
Hemoglobin: 13.5 g/dL (ref 13.0–17.0)
Immature Granulocytes: 0 %
Lymphocytes Relative: 16 %
Lymphs Abs: 0.9 10*3/uL (ref 0.7–4.0)
MCH: 31.6 pg (ref 26.0–34.0)
MCHC: 34.1 g/dL (ref 30.0–36.0)
MCV: 92.7 fL (ref 80.0–100.0)
Monocytes Absolute: 0.6 10*3/uL (ref 0.1–1.0)
Monocytes Relative: 10 %
Neutro Abs: 4 10*3/uL (ref 1.7–7.7)
Neutrophils Relative %: 69 %
Platelets: 138 10*3/uL — ABNORMAL LOW (ref 150–400)
RBC: 4.27 MIL/uL (ref 4.22–5.81)
RDW: 13 % (ref 11.5–15.5)
WBC: 5.8 10*3/uL (ref 4.0–10.5)
nRBC: 0 % (ref 0.0–0.2)

## 2019-09-30 ENCOUNTER — Ambulatory Visit
Admission: RE | Admit: 2019-09-30 | Discharge: 2019-09-30 | Disposition: A | Discharge status: Home / Self Care | Payer: PRIVATE HEALTH INSURANCE | Source: Ambulatory Visit | Attending: Radiation Oncology | Admitting: Radiation Oncology

## 2019-09-30 DIAGNOSIS — C7B01 Secondary carcinoid tumors of distant lymph nodes: Secondary | ICD-10-CM | POA: Diagnosis not present

## 2019-10-01 ENCOUNTER — Ambulatory Visit
Admission: RE | Admit: 2019-10-01 | Discharge: 2019-10-01 | Disposition: A | Discharge status: Home / Self Care | Payer: PRIVATE HEALTH INSURANCE | Source: Ambulatory Visit | Attending: Radiation Oncology | Admitting: Radiation Oncology

## 2019-10-01 DIAGNOSIS — C7B01 Secondary carcinoid tumors of distant lymph nodes: Secondary | ICD-10-CM | POA: Diagnosis not present

## 2019-10-02 ENCOUNTER — Ambulatory Visit
Admission: RE | Admit: 2019-10-02 | Discharge: 2019-10-02 | Disposition: A | Discharge status: Home / Self Care | Payer: PRIVATE HEALTH INSURANCE | Source: Ambulatory Visit | Attending: Radiation Oncology | Admitting: Radiation Oncology

## 2019-10-02 DIAGNOSIS — C7B01 Secondary carcinoid tumors of distant lymph nodes: Secondary | ICD-10-CM | POA: Diagnosis not present

## 2019-10-05 ENCOUNTER — Ambulatory Visit
Admission: RE | Admit: 2019-10-05 | Discharge: 2019-10-05 | Disposition: A | Discharge status: Home / Self Care | Payer: PRIVATE HEALTH INSURANCE | Source: Ambulatory Visit | Attending: Radiation Oncology | Admitting: Radiation Oncology

## 2019-10-05 DIAGNOSIS — C7B01 Secondary carcinoid tumors of distant lymph nodes: Secondary | ICD-10-CM | POA: Diagnosis not present

## 2019-10-06 ENCOUNTER — Inpatient Hospital Stay: Payer: PRIVATE HEALTH INSURANCE

## 2019-10-06 ENCOUNTER — Ambulatory Visit
Admission: RE | Admit: 2019-10-06 | Discharge: 2019-10-06 | Disposition: A | Discharge status: Home / Self Care | Payer: PRIVATE HEALTH INSURANCE | Source: Ambulatory Visit | Attending: Radiation Oncology | Admitting: Radiation Oncology

## 2019-10-06 DIAGNOSIS — C7B01 Secondary carcinoid tumors of distant lymph nodes: Secondary | ICD-10-CM | POA: Diagnosis not present

## 2019-10-07 ENCOUNTER — Ambulatory Visit
Admission: RE | Admit: 2019-10-07 | Discharge: 2019-10-07 | Disposition: A | Discharge status: Home / Self Care | Payer: PRIVATE HEALTH INSURANCE | Source: Ambulatory Visit | Attending: Radiation Oncology | Admitting: Radiation Oncology

## 2019-10-07 DIAGNOSIS — C7B01 Secondary carcinoid tumors of distant lymph nodes: Secondary | ICD-10-CM | POA: Diagnosis not present

## 2019-10-08 ENCOUNTER — Ambulatory Visit
Admission: RE | Admit: 2019-10-08 | Discharge: 2019-10-08 | Disposition: A | Discharge status: Home / Self Care | Payer: PRIVATE HEALTH INSURANCE | Source: Ambulatory Visit | Attending: Radiation Oncology | Admitting: Radiation Oncology

## 2019-10-08 DIAGNOSIS — C7B01 Secondary carcinoid tumors of distant lymph nodes: Secondary | ICD-10-CM | POA: Diagnosis not present

## 2019-10-09 ENCOUNTER — Ambulatory Visit
Admission: RE | Admit: 2019-10-09 | Discharge: 2019-10-09 | Disposition: A | Discharge status: Home / Self Care | Payer: PRIVATE HEALTH INSURANCE | Source: Ambulatory Visit | Attending: Radiation Oncology | Admitting: Radiation Oncology

## 2019-10-09 DIAGNOSIS — C7B01 Secondary carcinoid tumors of distant lymph nodes: Secondary | ICD-10-CM | POA: Diagnosis not present

## 2019-10-12 ENCOUNTER — Ambulatory Visit
Admission: RE | Admit: 2019-10-12 | Discharge: 2019-10-12 | Disposition: A | Discharge status: Home / Self Care | Payer: PRIVATE HEALTH INSURANCE | Source: Ambulatory Visit | Attending: Radiation Oncology | Admitting: Radiation Oncology

## 2019-10-12 DIAGNOSIS — Z510 Encounter for antineoplastic radiation therapy: Secondary | ICD-10-CM | POA: Insufficient documentation

## 2019-10-12 DIAGNOSIS — C182 Malignant neoplasm of ascending colon: Secondary | ICD-10-CM | POA: Insufficient documentation

## 2019-11-05 ENCOUNTER — Encounter: Payer: Self-pay | Admitting: Radiation Oncology

## 2019-11-05 ENCOUNTER — Ambulatory Visit
Admission: RE | Admit: 2019-11-05 | Discharge: 2019-11-05 | Disposition: A | Discharge status: Home / Self Care | Payer: Self-pay | Source: Ambulatory Visit | Attending: Radiation Oncology | Admitting: Radiation Oncology

## 2019-11-05 ENCOUNTER — Other Ambulatory Visit: Payer: Self-pay

## 2019-11-05 VITALS — BP 113/71 | HR 85 | Temp 97.2°F | Wt 159.0 lb

## 2019-11-05 DIAGNOSIS — C182 Malignant neoplasm of ascending colon: Secondary | ICD-10-CM | POA: Insufficient documentation

## 2019-11-05 DIAGNOSIS — Z9049 Acquired absence of other specified parts of digestive tract: Secondary | ICD-10-CM | POA: Insufficient documentation

## 2019-11-05 DIAGNOSIS — Z923 Personal history of irradiation: Secondary | ICD-10-CM | POA: Insufficient documentation

## 2019-11-05 DIAGNOSIS — R197 Diarrhea, unspecified: Secondary | ICD-10-CM | POA: Insufficient documentation

## 2019-11-05 DIAGNOSIS — C188 Malignant neoplasm of overlapping sites of colon: Secondary | ICD-10-CM

## 2019-11-05 NOTE — Progress Notes (Signed)
Radiation Oncology Follow up Note  Name: Tristan [name]   Date:   11/05/2019 MRN:  [id number] DOB: [birth year]    This 39 y.o. male presents to the clinic today for 1 month follow-up status post adjuvant radiation therapy to his right colon bed status post right colectomy and adjuvant FOLFOX chemotherapy for stage IIIb ([id number]) adenocarcinoma of the right colon.  REFERRING PROVIDER: No ref. provider found  HPI: Patient is a 39 year old male now at 1 month having completed adjuvant external beam radiation therapy to his right colon status post right colectomy for stage IIIb adenocarcinoma.  He is seen today in routine follow-up is doing well continues to have loose stools which preceded his radiation or treatment.  He states he is working well having no physical limitations.  He is having no abdominal pain..  COMPLICATIONS OF TREATMENT: none  FOLLOW UP COMPLIANCE: keeps appointments   PHYSICAL EXAM:  BP 113/71 (BP Location: Left Arm, Patient Position: Sitting, Cuff Size: Normal)   Pulse 85   Temp (!) 97.2 F (36.2 C) (Tympanic)   Wt 159 lb (72.1 kg)   BMI 20.98 kg/m  Well-developed well-nourished patient in NAD. HEENT reveals PERLA, EOMI, discs not visualized.  Oral cavity is clear. No oral mucosal lesions are identified. Neck is clear without evidence of cervical or supraclavicular adenopathy. Lungs are clear to A&P. Cardiac examination is essentially unremarkable with regular rate and rhythm without murmur rub or thrill. Abdomen is benign with no organomegaly or masses noted. Motor sensory and DTR levels are equal and symmetric in the upper and lower extremities. Cranial nerves II through XII are grossly intact. Proprioception is intact. No peripheral adenopathy or edema is identified. No motor or sensory levels are noted. Crude visual fields are within normal range.  RADIOLOGY RESULTS: No current films to review  PLAN: Present time patient is recovered nicely from his adjuvant  treatment.  I have suggested several things including occasional Imodium, low residue diet and activity a yogurt to help with some of his loose stools.  Does not seem to bother the patient that much although he will try some of my recommendations.  I have asked to see him back in 4 to 5 months for follow-up.  He continues close follow-up care with medical oncology.  I would like to take this opportunity to thank you for allowing me to participate in the care of your patient.Noreene Filbert, MD

## 2019-11-09 ENCOUNTER — Other Ambulatory Visit: Payer: Self-pay

## 2019-11-09 ENCOUNTER — Inpatient Hospital Stay: Payer: Self-pay | Attending: Oncology

## 2019-11-09 ENCOUNTER — Inpatient Hospital Stay (HOSPITAL_BASED_OUTPATIENT_CLINIC_OR_DEPARTMENT_OTHER): Payer: Self-pay | Admitting: Oncology

## 2019-11-09 VITALS — BP 112/66 | HR 62 | Temp 97.0°F | Wt 158.0 lb

## 2019-11-09 DIAGNOSIS — C7A012 Malignant carcinoid tumor of the ileum: Secondary | ICD-10-CM | POA: Insufficient documentation

## 2019-11-09 DIAGNOSIS — Z85038 Personal history of other malignant neoplasm of large intestine: Secondary | ICD-10-CM

## 2019-11-09 DIAGNOSIS — Z833 Family history of diabetes mellitus: Secondary | ICD-10-CM | POA: Insufficient documentation

## 2019-11-09 DIAGNOSIS — G620 Drug-induced polyneuropathy: Secondary | ICD-10-CM

## 2019-11-09 DIAGNOSIS — D6959 Other secondary thrombocytopenia: Secondary | ICD-10-CM

## 2019-11-09 DIAGNOSIS — J45909 Unspecified asthma, uncomplicated: Secondary | ICD-10-CM | POA: Insufficient documentation

## 2019-11-09 DIAGNOSIS — Z08 Encounter for follow-up examination after completed treatment for malignant neoplasm: Secondary | ICD-10-CM

## 2019-11-09 DIAGNOSIS — T451X5A Adverse effect of antineoplastic and immunosuppressive drugs, initial encounter: Secondary | ICD-10-CM | POA: Insufficient documentation

## 2019-11-09 DIAGNOSIS — C189 Malignant neoplasm of colon, unspecified: Secondary | ICD-10-CM | POA: Insufficient documentation

## 2019-11-09 DIAGNOSIS — Z800 Family history of malignant neoplasm of digestive organs: Secondary | ICD-10-CM | POA: Insufficient documentation

## 2019-11-09 DIAGNOSIS — F17210 Nicotine dependence, cigarettes, uncomplicated: Secondary | ICD-10-CM | POA: Insufficient documentation

## 2019-11-09 DIAGNOSIS — Z803 Family history of malignant neoplasm of breast: Secondary | ICD-10-CM | POA: Insufficient documentation

## 2019-11-09 DIAGNOSIS — C7951 Secondary malignant neoplasm of bone: Secondary | ICD-10-CM | POA: Insufficient documentation

## 2019-11-09 DIAGNOSIS — Z9221 Personal history of antineoplastic chemotherapy: Secondary | ICD-10-CM | POA: Insufficient documentation

## 2019-11-09 DIAGNOSIS — Z9049 Acquired absence of other specified parts of digestive tract: Secondary | ICD-10-CM | POA: Insufficient documentation

## 2019-11-09 LAB — CBC WITH DIFFERENTIAL/PLATELET
Abs Immature Granulocytes: 0.02 10*3/uL (ref 0.00–0.07)
Basophils Absolute: 0 10*3/uL (ref 0.0–0.1)
Basophils Relative: 1 %
Eosinophils Absolute: 0.1 10*3/uL (ref 0.0–0.5)
Eosinophils Relative: 2 %
HCT: 36.6 % — ABNORMAL LOW (ref 39.0–52.0)
Hemoglobin: 12.4 g/dL — ABNORMAL LOW (ref 13.0–17.0)
Immature Granulocytes: 0 %
Lymphocytes Relative: 24 %
Lymphs Abs: 1.2 10*3/uL (ref 0.7–4.0)
MCH: 30.8 pg (ref 26.0–34.0)
MCHC: 33.9 g/dL (ref 30.0–36.0)
MCV: 91 fL (ref 80.0–100.0)
Monocytes Absolute: 0.6 10*3/uL (ref 0.1–1.0)
Monocytes Relative: 11 %
Neutro Abs: 3.1 10*3/uL (ref 1.7–7.7)
Neutrophils Relative %: 62 %
Platelets: 146 10*3/uL — ABNORMAL LOW (ref 150–400)
RBC: 4.02 MIL/uL — ABNORMAL LOW (ref 4.22–5.81)
RDW: 13.1 % (ref 11.5–15.5)
WBC: 5.1 10*3/uL (ref 4.0–10.5)
nRBC: 0 % (ref 0.0–0.2)

## 2019-11-09 LAB — COMPREHENSIVE METABOLIC PANEL
ALT: 18 U/L (ref 0–44)
AST: 25 U/L (ref 15–41)
Albumin: 4.1 g/dL (ref 3.5–5.0)
Alkaline Phosphatase: 59 U/L (ref 38–126)
Anion gap: 8 (ref 5–15)
BUN: 11 mg/dL (ref 6–20)
CO2: 27 mmol/L (ref 22–32)
Calcium: 8.6 mg/dL — ABNORMAL LOW (ref 8.9–10.3)
Chloride: 103 mmol/L (ref 98–111)
Creatinine, Ser: 0.78 mg/dL (ref 0.61–1.24)
GFR calc Af Amer: >60 mL/min (ref >60)
GFR calc non Af Amer: >60 mL/min (ref >60)
Glucose, Bld: 103 mg/dL — ABNORMAL HIGH (ref 70–99)
Potassium: 3.9 mmol/L (ref 3.5–5.1)
Sodium: 138 mmol/L (ref 135–145)
Total Bilirubin: 0.7 mg/dL (ref 0.3–1.2)
Total Protein: 7 g/dL (ref 6.5–8.1)

## 2019-11-09 MED ORDER — HEPARIN SOD (PORK) LOCK FLUSH 100 UNIT/ML IV SOLN
INTRAVENOUS | Status: AC
  Filled 2019-11-09: qty 5

## 2019-11-09 MED ORDER — HEPARIN SOD (PORK) LOCK FLUSH 100 UNIT/ML IV SOLN
500.0000 [IU] | Freq: Once | INTRAVENOUS | Status: AC
  Administered 2019-11-09: 500 [IU] via INTRAVENOUS
  Filled 2019-11-09: qty 5

## 2019-11-09 MED ORDER — SODIUM CHLORIDE 0.9% FLUSH
10.0000 mL | INTRAVENOUS | Status: DC | PRN
  Administered 2019-11-09: 10 mL via INTRAVENOUS
  Filled 2019-11-09: qty 10

## 2019-11-09 NOTE — Progress Notes (Signed)
Hematology/Oncology Consult note [facility]  [phone] [phone]  Patient Care Team: Patient, No Pcp Per as PCP - General (General Practice) Clent Jacks, RN as Oncology Nurse Navigator Noreene Filbert, MD as Radiation Oncologist (Radiation Oncology)   Name of the patient: Tristan [name]  [id number]  June 30, 1980   Date of visit: 11/09/19  Diagnosis-  1. 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 Vicente Males back in July 2018 for abdominal pain. At that time CT showed inflammation of the sigmoid colon extending into the rectum. He was given a course of antibiotics and was asked to follow-up with GI. A colonoscopy was attempted at that time but was very hard to get past the sigmoid colon as it was tight and tortuous. Rectal biopsies at that time were normal. CT colonography and barium enema was recommended but patient was lost to follow-up. He then presented to the emergency room in August 2020 with symptoms of right lower quadrant abdominal pain. CT showed masslike thickening involving the cecum and the ascending colon the terminal ileum with surrounding inflammatory changes differentials included infectious/inflammatory enterocolitis as well as colonic neoplasm. Since it was difficult to do a colonoscopy a barium enema was performed on 01/09/2019 which showed a large irregular apple core lesion involving the lower portion of the right colon. Since it was difficult for the patient to undergo colonoscopy he was referred to Dr. Adora Fridge for definitive surgical management.  Patient underwent right hemicolectomy on 01/30/2019. Final pathology showed 2 distinct etiologies the first 1 was an adenocarcinoma 7.5 cm moderately  differentiated. With invasion of visceral peritoneum with findings of peritonitis adhesions and abscesses. Metastatic carcinoma in 2 out of 245 regional lymph nodes. Margins were negative. PT4PN1. Appendix encased in adhesions with adjacent abscess. MSI testing showed low probability of MSI high. 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 Ki-67 less than 3%  Adjuvant FOLFOX chemotherapy completed on 08/04/2019.  Patient is undergoing adjuvant radiation treatment as well given T4 lesion. Neuroendocrine tumor is being monitored   Interval history- reports neuropathy is getting better. Now mainly on finger tips and toes but does not affect quality of life significantly. Bowel movements are regular.   ECOG PS- 0 Pain scale- 0   Review of systems- Review of Systems  Constitutional: Negative for chills, fever, malaise/fatigue and weight loss.  HENT: Negative for congestion, ear discharge and nosebleeds.   Eyes: Negative for blurred vision.  Respiratory: Negative for cough, hemoptysis, sputum production, shortness of breath and wheezing.   Cardiovascular: Negative for chest pain, palpitations, orthopnea and claudication.  Gastrointestinal: Negative for abdominal pain, blood in stool, constipation, diarrhea, heartburn, melena, nausea and vomiting.  Genitourinary: Negative for dysuria, flank pain, frequency, hematuria and urgency.  Musculoskeletal: Negative for back pain, joint pain and myalgias.  Skin: Negative for rash.  Neurological: Positive for sensory change (peripheral neuropathy). Negative for dizziness, tingling, focal weakness, seizures, weakness and headaches.  Endo/Heme/Allergies: Does not bruise/bleed easily.  Psychiatric/Behavioral: Negative for depression and suicidal ideas. The patient does not have insomnia.        No Known Allergies   Past Medical History:  Diagnosis Date   Abdominal pain    Anemia    Asthma    Colon  cancer ([place])    Family history  of breast cancer    Family history of colon cancer    GERD (gastroesophageal reflux disease)    Nausea    Poor appetite    Weight loss      Past Surgical History:  Procedure Laterality Date   COLONOSCOPY N/A 01/30/2019   Procedure: COLONOSCOPY;  Surgeon: Jules Husbands, MD;  Location: [facility] ORS;  Service: General;  Laterality: N/A;   COLONOSCOPY WITH PROPOFOL N/A 08/16/2016   Procedure: COLONOSCOPY WITH PROPOFOL;  Surgeon: [name], MD;  Location: [facility] ENDOSCOPY;  Service: Endoscopy;  Laterality: N/A;   COLONOSCOPY WITH PROPOFOL N/A 01/30/2019   Procedure: COLONOSCOPY WITH PROPOFOL;  Surgeon: [name], MD;  Location: [facility] ENDOSCOPY;  Service: Gastroenterology;  Laterality: N/A;  TRAVEL CASE TO O.R. - PROCEDURE TO START AT 7:30 AM IN O.R.   LAPAROSCOPIC RIGHT COLECTOMY N/A 01/30/2019   Procedure: LAPAROSCOPIC HAND ASSISTED RIGHT COLECTOMY CONVERTED TO OPEN PROCEDURE;  Surgeon: Jules Husbands, MD;  Location: [facility] ORS;  Service: General;  Laterality: N/A;   NO PAST SURGERIES     PORTACATH PLACEMENT Right 03/04/2019   Procedure: INSERTION PORT-A-CATH;  Surgeon: Jules Husbands, MD;  Location: [facility] ORS;  Service: General;  Laterality: Right;    Social History   Socioeconomic History   Marital status: Single    Spouse name: Not on file   Number of children: Not on file   Years of education: Not on file   Highest education level: Not on file  Occupational History   Not on file  Tobacco Use   Smoking status: Current Every Day Smoker    Packs/day: 0.50    Types: Cigarettes   Smokeless tobacco: Never Used  Vaping Use   Vaping Use: Never used  Substance and Sexual Activity   Alcohol use: Not Currently    Comment: off weeks of chemo 1-2 beers   Drug use: Yes    Types: Marijuana    Comment: last week   Sexual activity: Yes    Birth control/protection: Condom  Other Topics Concern   Not on file  Social History Narrative     Not on file   Social Determinants of Health   Financial Resource Strain:    Difficulty of Paying Living Expenses: Not on file  Food Insecurity:    Worried About [place] in the Last Year: Not on file   [employer] of Food in the Last Year: Not on file  Transportation Needs:    Lack of Transportation (Medical): Not on file   Lack of Transportation (Non-Medical): Not on file  Physical Activity:    Days of Exercise per Week: Not on file   Minutes of Exercise per Session: Not on file  Stress:    Feeling of Stress : Not on file  Social Connections:    Frequency of Communication with Friends and Family: Not on file   Frequency of Social Gatherings with Friends and Family: Not on file   Attends Religious Services: Not on file   Active Member of Clubs or Organizations: Not on file   Attends Archivist Meetings: Not on file   Marital Status: Not on file  Intimate Partner Violence:    Fear of Current or Ex-Partner: Not on file   Emotionally Abused: Not on file   Physically Abused: Not on file   Sexually Abused: Not on file    Family History  Problem Relation Age of Onset   Diabetes Mother 46  Type 1   Breast cancer Mother 59   Healthy Father    Colon polyps Father    Ulcerative colitis Paternal Aunt    Multiple sclerosis Paternal Aunt    Dementia Paternal Uncle    Breast cancer Maternal Grandmother 15   Breast cancer Paternal Grandmother 71   Rectal cancer Maternal Great-grandmother    Colon cancer Cousin      Current Outpatient Medications:    alum hydroxide-mag trisilicate (GAVISCON) 96-29 MG CHEW chewable tablet, Chew 2 tablets by mouth as needed. (Patient not taking: Reported on 11/05/2019), Disp: , Rfl:    dronabinol (MARINOL) 5 MG capsule, Take 1 capsule (5 mg total) by mouth 2 (two) times daily before a meal. (Patient not taking: Reported on 08/18/2019), Disp: 28 capsule, Rfl: 1  Physical exam:  Vitals:    11/09/19 1542  BP: 112/66  Pulse: 62  Temp: (!) 97 F (36.1 C)  SpO2: 100%  Weight: 158 lb (71.7 kg)   Physical Exam HENT:     Head: Normocephalic and atraumatic.  Eyes:     Pupils: Pupils are equal, round, and reactive to light.  Cardiovascular:     Rate and Rhythm: Normal rate and regular rhythm.     Heart sounds: Normal heart sounds.  Pulmonary:     Effort: Pulmonary effort is normal.     Breath sounds: Normal breath sounds.  Abdominal:     General: Bowel sounds are normal.     Palpations: Abdomen is soft.  Musculoskeletal:     Cervical back: Normal range of motion.  Skin:    General: Skin is warm and dry.  Neurological:     Mental Status: He is alert and oriented to person, place, and time.      CMP Latest Ref Rng & Units 09/29/2019  Glucose 70 - 99 mg/dL 78  BUN 6 - 20 mg/dL 8  Creatinine 0.61 - 1.24 mg/dL 0.87  Sodium 135 - 145 mmol/L 141  Potassium 3.5 - 5.1 mmol/L 5.0  Chloride 98 - 111 mmol/L 106  CO2 22 - 32 mmol/L 29  Calcium 8.9 - 10.3 mg/dL 9.1  Total Protein 6.5 - 8.1 g/dL 6.8  Total Bilirubin 0.3 - 1.2 mg/dL 0.4  Alkaline Phos 38 - 126 U/L 76  AST 15 - 41 U/L 21  ALT 0 - 44 U/L 14   CBC Latest Ref Rng & Units 09/29/2019  WBC 4.0 - 10.5 K/uL 5.8  Hemoglobin 13.0 - 17.0 g/dL 13.5  Hematocrit 39 - 52 % 39.6  Platelets 150 - 400 K/uL 138(L)      Assessment and plan- Patient is a 39 y.o. 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  Is s/p 11 cycles of FOLFOX chemotherapy adjuvantly and currently undergoing adjuvant radiation treatment.  This is a routine follow-up visit for colon cancer  Clinically patient is doing well with no concerning signs and symptoms of recurrence based on today's exam.CBC CMP and CEA are normal.  I will see him back in December 2021 with same labs and CT chest abdomen pelvis with contrast prior.  Patient had his last  colonoscopy in November 2020 and will need another one this year in November.  I will get in touch with Dr. Vicente Males about this  Chemo-induced peripheral neuropathy: Mild grade 1 continue to monitor Chemo-induced thrombocytopenia mild and likely to resolve    Visit Diagnosis 1. Encounter for  follow-up surveillance of colon cancer   2. Chemotherapy-induced peripheral neuropathy ([place])   3. Chemotherapy-induced thrombocytopenia      Dr. Randa Evens, MD, MPH [facility] [phone] 11/09/2019 3:09 PM

## 2019-11-10 ENCOUNTER — Encounter: Payer: Self-pay | Admitting: Oncology

## 2019-11-10 LAB — CEA: CEA: 3.6 ng/mL (ref 0.0–4.7)

## 2019-11-11 ENCOUNTER — Other Ambulatory Visit: Payer: Self-pay | Admitting: *Deleted

## 2019-11-11 ENCOUNTER — Encounter: Payer: Self-pay | Admitting: Oncology

## 2019-11-11 ENCOUNTER — Encounter: Payer: Self-pay | Admitting: *Deleted

## 2019-11-11 DIAGNOSIS — Z08 Encounter for follow-up examination after completed treatment for malignant neoplasm: Secondary | ICD-10-CM

## 2019-11-11 DIAGNOSIS — Z95828 Presence of other vascular implants and grafts: Secondary | ICD-10-CM

## 2019-12-02 ENCOUNTER — Other Ambulatory Visit: Payer: Self-pay

## 2019-12-02 ENCOUNTER — Ambulatory Visit (INDEPENDENT_AMBULATORY_CARE_PROVIDER_SITE_OTHER): Payer: Self-pay | Admitting: Surgery

## 2019-12-02 ENCOUNTER — Encounter: Payer: Self-pay | Admitting: Surgery

## 2019-12-02 VITALS — BP 121/70 | HR 73 | Temp 98.6°F | Resp 14 | Ht 73.0 in | Wt 160.0 lb

## 2019-12-02 DIAGNOSIS — K6389 Other specified diseases of intestine: Secondary | ICD-10-CM

## 2019-12-02 NOTE — Patient Instructions (Addendum)
PLease see your follow up appointment. Please try to have your colonoscopy prior to this appointment.  Referral has been sent to [facility] to schedule Colonoscopy.  Implanted Port Removal Implanted port removal is a procedure to remove the port and catheter (port-a-cath) that is implanted under your skin. The port is a small disc under your skin that can be punctured with a needle. It is connected to a vein in your chest or neck by a small flexible tube (catheter). The port-a-cath is used for treatment through an IV tube and for taking blood samples. Your health care provider will remove the port-a-cath if:  You no longer need it for treatment.  It is not working properly.  The area around it gets infected.  Tell a health care provider about:  Any allergies you have.  All medicines you are taking, including vitamins, herbs, eye drops, creams, and over-the-counter medicines.  Any problems you or family members have had with anesthetic medicines.  Any blood disorders you have.  Any surgeries you have had.  Any medical conditions you have.  Whether you are pregnant or may be pregnant. What are the risks? Generally, this is a safe procedure. However, problems may occur, including:  Infection.  Bleeding.  Allergic reactions to anesthetic medicines.  Damage to nerves or blood vessels.  What happens before the procedure?  You will have: ? A physical exam. ? Blood tests. ? Imaging tests, including a chest X-ray.  Follow instructions from your health care provider about eating or drinking restrictions.  Ask your health care provider about: ? Changing or stopping your regular medicines. This is especially important if you are taking diabetes medicines or blood thinners. ? Taking medicines such as aspirin and ibuprofen. These medicines can thin your blood. Do not take these medicines before your procedure if your surgeon instructs you not to.  Ask your health care provider how  your surgical site will be marked or identified.  You may be given antibiotic medicine to help prevent infection.  Plan to have someone take you home after the procedure.  If you will be going home right after the procedure, plan to have someone stay with you for 24 hours. What happens during the procedure?  To reduce your risk of infection: ? Your health care team will wash or sanitize their hands. ? Your skin will be washed with soap.  You may be given one or more of the following: ? A medicine to help you relax (sedative). ? A medicine to numb the area (local anesthetic).  A small cut (incision) will be made at the site of your port-a-cath.  The port-a-cath and the catheter that has been inside your vein will gently be removed.  The incision will be closed with stitches (sutures), adhesive strips, or skin glue.  A bandage (dressing) will be placed over the incision. The procedure may vary among health care providers and hospitals. What happens after the procedure?  Your blood pressure, heart rate, breathing rate, and blood oxygen level will be monitored often until the medicines you were given have worn off.  Do not drive for 24 hours if you received a sedative. This information is not intended to replace advice given to you by your health care provider. Make sure you discuss any questions you have with your health care provider. Document Released: 02/07/2015 Document Revised: 08/04/2015 Document Reviewed: 12/01/2014 Elsevier Interactive Patient Education  [employer].

## 2019-12-02 NOTE — Progress Notes (Signed)
Procedure note Removal of port  Anesthesia: lidocaine 1% w epi 10 cc  EBL: minimal  Complications: none   After informed consent was obtained patient was prepped and draped in the usual sterile fashion.  Local anesthetic was injected into the subcutaneous  tissue and incision was created w 15 blade knif.  Using Metzenbaum scissors were able to identify the cause of and free of the port from adjacent structures.  We also the 2 Prolene sutures.  We asked the patient to do a Valsalva maneuver and remove the catheter.  Hemostasis was obtained with pressure and the wound was closed in a 2 layer fashion with 3-0 Vicryl and 4 Monocryl for the skin in a subcuticular fashion.  There was applied

## 2019-12-11 ENCOUNTER — Encounter: Payer: Self-pay | Admitting: Gastroenterology

## 2019-12-31 ENCOUNTER — Inpatient Hospital Stay: Payer: Self-pay | Attending: Oncology

## 2020-02-23 ENCOUNTER — Other Ambulatory Visit: Payer: Self-pay

## 2020-02-23 ENCOUNTER — Inpatient Hospital Stay: Payer: Self-pay | Attending: Radiation Oncology

## 2020-02-23 ENCOUNTER — Ambulatory Visit
Admission: RE | Admit: 2020-02-23 | Discharge: 2020-02-23 | Disposition: A | Discharge status: Home / Self Care | Payer: Self-pay | Source: Ambulatory Visit | Attending: Oncology | Admitting: Oncology

## 2020-02-23 DIAGNOSIS — G620 Drug-induced polyneuropathy: Secondary | ICD-10-CM | POA: Insufficient documentation

## 2020-02-23 DIAGNOSIS — Z85038 Personal history of other malignant neoplasm of large intestine: Secondary | ICD-10-CM | POA: Insufficient documentation

## 2020-02-23 DIAGNOSIS — T451X5A Adverse effect of antineoplastic and immunosuppressive drugs, initial encounter: Secondary | ICD-10-CM | POA: Insufficient documentation

## 2020-02-23 DIAGNOSIS — Z08 Encounter for follow-up examination after completed treatment for malignant neoplasm: Secondary | ICD-10-CM | POA: Insufficient documentation

## 2020-02-23 MED ORDER — IOHEXOL 300 MG/ML  SOLN
100.0000 mL | Freq: Once | INTRAMUSCULAR | Status: AC | PRN
  Administered 2020-02-23: 16:00:00 85 mL via INTRAVENOUS

## 2020-02-23 NOTE — Progress Notes (Signed)
..  The following Assist/Replace Program for [place] from [place] has been terminated due to MAP expired 02/10/2020.  Last DOS:02/25/2019

## 2020-02-25 ENCOUNTER — Inpatient Hospital Stay: Payer: Self-pay | Admitting: Oncology

## 2020-03-02 ENCOUNTER — Encounter: Payer: Self-pay | Admitting: Surgery

## 2020-03-02 ENCOUNTER — Ambulatory Visit (INDEPENDENT_AMBULATORY_CARE_PROVIDER_SITE_OTHER): Payer: Self-pay | Admitting: Surgery

## 2020-03-02 ENCOUNTER — Other Ambulatory Visit: Payer: Self-pay

## 2020-03-02 VITALS — BP 126/75 | HR 70 | Temp 97.8°F | Ht 73.0 in | Wt 160.8 lb

## 2020-03-02 DIAGNOSIS — K6389 Other specified diseases of intestine: Secondary | ICD-10-CM

## 2020-03-02 NOTE — Patient Instructions (Addendum)
We have put in a referral to [facility] to get you scheduled for a colonoscopy. They will call you with appointment. For diarrhea, get Imodium OTC to help with your symptoms. You need to have a primary care provider. Try calling Dr. Freddi Starr in [place] to set up appointment at [phone].  We will see you back in 6 months for a follow up. If you have not heard from anyone regarding your follow up appointment by June 1,2022, please give our office a call.

## 2020-03-04 NOTE — Progress Notes (Signed)
Outpatient Surgical Follow Up  03/04/2020  Tristan [name] is an 39 y.o. male.   Chief Complaint  Patient presents with  . Follow-up    Colon mass    HPI: 39 year old male well-known to me with a prior history of stage III colon cancer status post right colectomy.  Did receive chemotherapy and has completed treatment.  He is doing well.  He does have some increase in bowel movements.  No fevers no chills no abdominal pain.  I have personally reviewed the CT of the chest and abdomen and pelvis last week showing no evidence of metastatic disease no evidence of complications related to surgery. Has not completed a full colonoscopy as of the time of surgery they were only able to evaluate the rectum and they did not transverse the sigmoid colon due to the mass-effect from the right colon cancer..  Past Medical History:  Diagnosis Date  . Abdominal pain   . Anemia   . Asthma   . Colon cancer ([place])   . Family history of breast cancer   . Family history of colon cancer   . GERD (gastroesophageal reflux disease)   . Nausea   . Poor appetite   . Weight loss     Past Surgical History:  Procedure Laterality Date  . COLONOSCOPY N/A 01/30/2019   Procedure: COLONOSCOPY;  Surgeon: Jules Husbands, MD;  Location: [facility] ORS;  Service: General;  Laterality: N/A;  . COLONOSCOPY WITH PROPOFOL N/A 08/16/2016   Procedure: COLONOSCOPY WITH PROPOFOL;  Surgeon: [name], MD;  Location: [facility] ENDOSCOPY;  Service: Endoscopy;  Laterality: N/A;  . COLONOSCOPY WITH PROPOFOL N/A 01/30/2019   Procedure: COLONOSCOPY WITH PROPOFOL;  Surgeon: [name], MD;  Location: [facility] ENDOSCOPY;  Service: Gastroenterology;  Laterality: N/A;  TRAVEL CASE TO O.R. - PROCEDURE TO START AT 7:30 AM IN O.R.  . LAPAROSCOPIC RIGHT COLECTOMY N/A 01/30/2019   Procedure: LAPAROSCOPIC HAND ASSISTED RIGHT COLECTOMY CONVERTED TO OPEN PROCEDURE;  Surgeon: Jules Husbands, MD;  Location: [facility] ORS;  Service: General;  Laterality: N/A;  . NO  PAST SURGERIES    . PORTACATH PLACEMENT Right 03/04/2019   Procedure: INSERTION PORT-A-CATH;  Surgeon: Jules Husbands, MD;  Location: [facility] ORS;  Service: General;  Laterality: Right;    Family History  Problem Relation Age of Onset  . Diabetes Mother 33       Type 1  . Breast cancer Mother 56  . Healthy Father   . Colon polyps Father   . Ulcerative colitis Paternal Aunt   . Multiple sclerosis Paternal Aunt   . Dementia Paternal Uncle   . Breast cancer Maternal Grandmother 36  . Breast cancer Paternal Grandmother 37  . Rectal cancer Maternal Great-grandmother   . Colon cancer Cousin     Social History:  reports that he has been smoking cigarettes. He has been smoking about 0.50 packs per day. He has never used smokeless tobacco. He reports previous alcohol use. He reports current drug use. Drug: Marijuana.  Allergies: No Known Allergies  Medications reviewed.    ROS Full ROS performed and is otherwise negative other than what is stated in HPI   BP 126/75   Pulse 70   Temp 97.8 F (36.6 C) (Oral)   Ht 6\' 1"  (1.854 m)   Wt 160 lb 12.8 oz (72.9 kg)   SpO2 97%   BMI 21.22 kg/m   Physical Exam Vitals and nursing note reviewed. Exam conducted with a chaperone present.  Constitutional:  Appearance: Normal appearance. He is normal weight.  Cardiovascular:     Rate and Rhythm: Normal rate and regular rhythm.     Pulses: Normal pulses.  Pulmonary:     Effort: Pulmonary effort is normal. No respiratory distress.     Breath sounds: Normal breath sounds. No stridor. No wheezing or rhonchi.  Abdominal:     General: Abdomen is flat. There is no distension.     Palpations: Abdomen is soft. There is no mass.     Tenderness: There is no abdominal tenderness. There is no guarding or rebound.     Hernia: No hernia is present.  Musculoskeletal:        General: No swelling or tenderness. Normal range of motion.     Cervical back: Normal range of motion and neck supple.   Skin:    General: Skin is warm and dry.     Capillary Refill: Capillary refill takes less than 2 seconds.  Neurological:     General: No focal deficit present.     Mental Status: He is alert and oriented to person, place, and time.  Psychiatric:        Mood and Affect: Mood normal.        Behavior: Behavior normal.        Thought Content: Thought content normal.        Judgment: Judgment normal.        Assessment/Plan: 39 year old male with a past medical history significant for obstructing colon cancer status post right colectomy and adjuvant chemotherapy.  He is doing well there is no evidence of recurrence.  He has failed to show multiple times for a completion colonoscopy as he has never completed endoscopic evaluation of the descending colon and transverse colon.  I again discussed with him in detail about the importance of having the completion colonoscopy to make sure that we detect early polyps as he is at high risk for synchronous lesion.  Extensive counseling provided.  We will once again arrange for him to have colonoscopy  Greater than 50% of the 40 minutes  visit was spent in counseling/coordination of care   Caroleen Hamman, MD [place] Surgeon

## 2020-03-14 ENCOUNTER — Other Ambulatory Visit: Payer: Self-pay

## 2020-03-14 ENCOUNTER — Encounter: Payer: Self-pay | Admitting: Physician Assistant

## 2020-03-14 ENCOUNTER — Ambulatory Visit: Payer: BC Managed Care – PPO | Admitting: Physician Assistant

## 2020-03-14 DIAGNOSIS — B977 Papillomavirus as the cause of diseases classified elsewhere: Secondary | ICD-10-CM

## 2020-03-14 DIAGNOSIS — Z113 Encounter for screening for infections with a predominantly sexual mode of transmission: Secondary | ICD-10-CM

## 2020-03-14 NOTE — Progress Notes (Signed)
Tristan [name] [id number] y.o. comes into clinic today for cryotx.  Denies any problems with previous treatments.  Reports that the HPV have returned improved since the last treatment.  Desires to proceed with cryotx today. 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  No edema, erythema, or ulcerative lesions present today. Cryo treatment in 3 freeze/thaw cycles today.  Patient tolerated procedure well. Reviewed with patient after-care instructions and when to call clinic. No sex until area has completely healed. Rec condoms with all sex RTC in 10-14 days for next treatment if needed.

## 2020-03-15 ENCOUNTER — Other Ambulatory Visit: Payer: Self-pay | Admitting: *Deleted

## 2020-03-15 ENCOUNTER — Inpatient Hospital Stay: Payer: BC Managed Care – PPO | Attending: Oncology | Admitting: Oncology

## 2020-03-15 ENCOUNTER — Encounter: Payer: Self-pay | Admitting: Oncology

## 2020-03-15 VITALS — BP 128/67 | HR 87 | Temp 98.1°F | Resp 20 | Wt 160.2 lb

## 2020-03-15 DIAGNOSIS — Z08 Encounter for follow-up examination after completed treatment for malignant neoplasm: Secondary | ICD-10-CM

## 2020-03-15 DIAGNOSIS — Z9221 Personal history of antineoplastic chemotherapy: Secondary | ICD-10-CM | POA: Insufficient documentation

## 2020-03-15 DIAGNOSIS — F17210 Nicotine dependence, cigarettes, uncomplicated: Secondary | ICD-10-CM | POA: Insufficient documentation

## 2020-03-15 DIAGNOSIS — Z9049 Acquired absence of other specified parts of digestive tract: Secondary | ICD-10-CM | POA: Insufficient documentation

## 2020-03-15 DIAGNOSIS — Z79899 Other long term (current) drug therapy: Secondary | ICD-10-CM | POA: Diagnosis not present

## 2020-03-15 DIAGNOSIS — T451X5A Adverse effect of antineoplastic and immunosuppressive drugs, initial encounter: Secondary | ICD-10-CM | POA: Diagnosis not present

## 2020-03-15 DIAGNOSIS — Z85038 Personal history of other malignant neoplasm of large intestine: Secondary | ICD-10-CM | POA: Diagnosis not present

## 2020-03-15 DIAGNOSIS — Z803 Family history of malignant neoplasm of breast: Secondary | ICD-10-CM | POA: Insufficient documentation

## 2020-03-15 DIAGNOSIS — K219 Gastro-esophageal reflux disease without esophagitis: Secondary | ICD-10-CM | POA: Insufficient documentation

## 2020-03-15 DIAGNOSIS — G620 Drug-induced polyneuropathy: Secondary | ICD-10-CM | POA: Insufficient documentation

## 2020-03-15 NOTE — Progress Notes (Signed)
Hematology/Oncology Consult note [facility]  Telephone:([phone] [phone]  Patient Care Team: Patient, No Pcp Per as PCP - General (General Practice) Clent Jacks, RN as Oncology Nurse Navigator Noreene Filbert, MD as Radiation Oncologist (Radiation Oncology)   Name of the patient: Tristan [name]  [id number]  Jan 31, 1981   Date of visit: 03/15/20  Diagnosis-  1. 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 Vicente Males back in July 2018 for abdominal pain. At that time CT showed inflammation of the sigmoid colon extending into the rectum. He was given a course of antibiotics and was asked to follow-up with GI. A colonoscopy was attempted at that time but was very hard to get past the sigmoid colon as it was tight and tortuous. Rectal biopsies at that time were normal. CT colonography and barium enema was recommended but patient was lost to follow-up. He then presented to the emergency room in August 2020 with symptoms of right lower quadrant abdominal pain. CT showed masslike thickening involving the cecum and the ascending colon the terminal ileum with surrounding inflammatory changes differentials included infectious/inflammatory enterocolitis as well as colonic neoplasm. Since it was difficult to do a colonoscopy a barium enema was performed on 01/09/2019 which showed a large irregular apple core lesion involving the lower portion of the right colon. Since it was difficult for the patient to undergo colonoscopy he was referred to Dr. Adora Fridge for definitive surgical management.  Patient underwent right hemicolectomy on 01/30/2019. Final pathology showed 2 distinct etiologies the first 1 was an adenocarcinoma 7.5 cm moderately  differentiated. With invasion of visceral peritoneum with findings of peritonitis adhesions and abscesses. Metastatic carcinoma in 2 out of 245 regional lymph nodes. Margins were negative. PT4PN1. Appendix encased in adhesions with adjacent abscess. MSI testing showed low probability of MSI high. 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 Ki-67 less than 3%  Adjuvant FOLFOX chemotherapycompleted on 08/04/2019. Patient is undergoing adjuvant radiation treatment as well given T4 lesion. Neuroendocrine tumor is being monitored   Interval history-peripheral neuropathy has almost resolved.  He is active and works at [employer].  Denies any abdominal symptoms at this time appetite and weight have remained stable  ECOG PS- 0 Pain scale- 0  Review of systems- Review of Systems  Constitutional: Negative for chills, fever, malaise/fatigue and weight loss.  HENT: Negative for congestion, ear discharge and nosebleeds.   Eyes: Negative for blurred vision.  Respiratory: Negative for cough, hemoptysis, sputum production, shortness of breath and wheezing.   Cardiovascular: Negative for chest pain, palpitations, orthopnea and claudication.  Gastrointestinal: Negative for abdominal pain, blood in stool, constipation, diarrhea, heartburn, melena, nausea and vomiting.  Genitourinary: Negative for dysuria, flank pain, frequency, hematuria and urgency.  Musculoskeletal: Negative for back pain, joint pain and myalgias.  Skin: Negative for rash.  Neurological: Negative for dizziness, tingling, focal weakness, seizures, weakness and headaches.  Endo/Heme/Allergies: Does not bruise/bleed easily.  Psychiatric/Behavioral: Negative for depression and suicidal ideas. The patient does not have insomnia.       No Known Allergies   Past Medical History:  Diagnosis Date  . Abdominal pain   . Anemia   . Asthma   . Colon cancer ([place])   . Family history of breast cancer    . Family history of colon  cancer   . GERD (gastroesophageal reflux disease)   . Nausea   . Poor appetite   . Weight loss      Past Surgical History:  Procedure Laterality Date  . COLONOSCOPY N/A 01/30/2019   Procedure: COLONOSCOPY;  Surgeon: Jules Husbands, MD;  Location: [facility] ORS;  Service: General;  Laterality: N/A;  . COLONOSCOPY WITH PROPOFOL N/A 08/16/2016   Procedure: COLONOSCOPY WITH PROPOFOL;  Surgeon: [name], MD;  Location: [facility] ENDOSCOPY;  Service: Endoscopy;  Laterality: N/A;  . COLONOSCOPY WITH PROPOFOL N/A 01/30/2019   Procedure: COLONOSCOPY WITH PROPOFOL;  Surgeon: [name], MD;  Location: [facility] ENDOSCOPY;  Service: Gastroenterology;  Laterality: N/A;  TRAVEL CASE TO O.R. - PROCEDURE TO START AT 7:30 AM IN O.R.  . LAPAROSCOPIC RIGHT COLECTOMY N/A 01/30/2019   Procedure: LAPAROSCOPIC HAND ASSISTED RIGHT COLECTOMY CONVERTED TO OPEN PROCEDURE;  Surgeon: Jules Husbands, MD;  Location: [facility] ORS;  Service: General;  Laterality: N/A;  . NO PAST SURGERIES    . PORTACATH PLACEMENT Right 03/04/2019   Procedure: INSERTION PORT-A-CATH;  Surgeon: Jules Husbands, MD;  Location: [facility] ORS;  Service: General;  Laterality: Right;    Social History   Socioeconomic History  . Marital status: Single    Spouse name: Not on file  . Number of children: Not on file  . Years of education: Not on file  . Highest education level: Not on file  Occupational History  . Not on file  Tobacco Use  . Smoking status: Current Every Day Smoker    Packs/day: 0.50    Types: Cigarettes  . Smokeless tobacco: Never Used  Vaping Use  . Vaping Use: Never used  Substance and Sexual Activity  . Alcohol use: Not Currently    Comment: off weeks of chemo 1-2 beers  . Drug use: Yes    Types: Marijuana    Comment: last week  . Sexual activity: Yes    Birth control/protection: Condom  Other Topics Concern  . Not on file  Social History Narrative  . Not on file   Social Determinants of Health    Financial Resource Strain: Not on file  Food Insecurity: Not on file  Transportation Needs: Not on file  Physical Activity: Not on file  Stress: Not on file  Social Connections: Not on file  Intimate Partner Violence: Not on file    Family History  Problem Relation Age of Onset  . Diabetes Mother 22       Type 1  . Breast cancer Mother 25  . Healthy Father   . Colon polyps Father   . Ulcerative colitis Paternal Aunt   . Multiple sclerosis Paternal Aunt   . Dementia Paternal Uncle   . Breast cancer Maternal Grandmother 61  . Breast cancer Paternal Grandmother 71  . Rectal cancer Maternal Great-grandmother   . Colon cancer Cousin     No current outpatient medications on file. No current facility-administered medications for this visit.  Facility-Administered Medications Ordered in Other Visits:  .  sodium chloride flush (NS) 0.9 % injection 10 mL, 10 mL, Intravenous, PRN, Sindy Guadeloupe, MD, 10 mL at 11/09/19 1537  Physical exam:  Vitals:   03/15/20 1317  BP: 128/67  Pulse: 87  Resp: 20  Temp: 98.1 F (36.7 C)  TempSrc: Tympanic  SpO2: 97%  Weight: 160 lb 3.2 oz (72.7 kg)   Physical Exam HENT:     Head: Normocephalic and atraumatic.  Eyes:     Extraocular Movements:  EOM normal.     Pupils: Pupils are equal, round, and reactive to light.  Cardiovascular:     Rate and Rhythm: Normal rate and regular rhythm.     Heart sounds: Normal heart sounds.  Pulmonary:     Effort: Pulmonary effort is normal.     Breath sounds: Normal breath sounds.  Abdominal:     General: Bowel sounds are normal.     Palpations: Abdomen is soft.  Musculoskeletal:     Cervical back: Normal range of motion.  Skin:    General: Skin is warm and dry.  Neurological:     Mental Status: He is alert and oriented to person, place, and time.      CMP Latest Ref Rng & Units 11/09/2019  Glucose 70 - 99 mg/dL 103(H)  BUN 6 - 20 mg/dL 11  Creatinine 0.61 - 1.24 mg/dL 0.78  Sodium 135 - 145  mmol/L 138  Potassium 3.5 - 5.1 mmol/L 3.9  Chloride 98 - 111 mmol/L 103  CO2 22 - 32 mmol/L 27  Calcium 8.9 - 10.3 mg/dL 8.6(L)  Total Protein 6.5 - 8.1 g/dL 7.0  Total Bilirubin 0.3 - 1.2 mg/dL 0.7  Alkaline Phos 38 - 126 U/L 59  AST 15 - 41 U/L 25  ALT 0 - 44 U/L 18   CBC Latest Ref Rng & Units 11/09/2019  WBC 4.0 - 10.5 K/uL 5.1  Hemoglobin 13.0 - 17.0 g/dL 12.4(L)  Hematocrit 39.0 - 52.0 % 36.6(L)  Platelets 150 - 400 K/uL 146(L)    No images are attached to the encounter.  CT CHEST ABDOMEN PELVIS W CONTRAST  Result Date: 02/23/2020 CLINICAL DATA:  40 year old male with history of colon cancer presenting for follow-up. EXAM: CT CHEST, ABDOMEN, AND PELVIS WITH CONTRAST TECHNIQUE: Multidetector CT imaging of the chest, abdomen and pelvis was performed following the standard protocol during bolus administration of intravenous contrast. CONTRAST:  49m OMNIPAQUE IOHEXOL 300 MG/ML  SOLN COMPARISON:  CT of the chest abdomen pelvis dated 09/01/2019. FINDINGS: CT CHEST FINDINGS Cardiovascular: There is no cardiomegaly or pericardial effusion. The thoracic aorta is unremarkable. The origins of the great vessels of the aortic arch appear patent as visualized. The central pulmonary arteries are unremarkable. Mediastinum/Nodes: No hilar or mediastinal adenopathy. The esophagus and the thyroid gland are grossly unremarkable. No mediastinal fluid collection. Lungs/Pleura: The lungs are clear. There is no pleural effusion pneumothorax. The central airways are patent. Musculoskeletal: Tiny scattered osseous lucencies, nonspecific and similar to prior CT. No acute osseous pathology. CT ABDOMEN PELVIS FINDINGS No intra-abdominal free air or free fluid. Hepatobiliary: No focal liver abnormality is seen. No gallstones, gallbladder wall thickening, or biliary dilatation. Pancreas: Unremarkable. No pancreatic ductal dilatation or surrounding inflammatory changes. Spleen: Normal in size without focal  abnormality. Adrenals/Urinary Tract: The adrenal glands unremarkable. The kidneys, visualized ureters, and urinary bladder appear unremarkable. Stomach/Bowel: There is no bowel obstruction or active inflammation. Postsurgical changes of right hemicolectomy with ileotransverse anastomosis. Vascular/Lymphatic: The abdominal aorta and IVC unremarkable. No portal venous gas. There is no adenopathy. Reproductive: The prostate and seminal vesicles are grossly unremarkable. No pelvic mass. Other: None Musculoskeletal: Age advanced degenerative changes of the spine. Bilateral L5 pars defects. Tiny scattered bone lucency similar to prior CT, nonspecific. No acute osseous pathology. IMPRESSION: 1. No acute intrathoracic, abdominal, or pelvic pathology. No evidence of recurrent or metastatic disease. 2. Postsurgical changes of right hemicolectomy. No bowel obstruction. Electronically Signed   By: AAnner CreteM.D.   On: 02/23/2020  22:25     Assessment and plan- Patient is a 40 y.o. 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 He is status post 11 cycles of adjuvant FOLFOX and adjuvant radiation treatment.  This is a routine surveillance visit for colon cancer  Patient surgery was in November 2020.  He will need surveillance colonoscopy at this time and he has a virtual visit coming up with Dr. Vicente Males next month.  Clinically patient is doing well with no concerning signs and symptoms of recurrence based on today's exam.He had a recent CT chest abdomen pelvis with contrast in December 2021 which did not show any evidence of recurrent or metastatic disease.  I will see him back in 3 months with CBC with differential CMP and CEA  Chemo-induced peripheral neuropathy: Resolved.   Visit Diagnosis 1. Encounter for follow-up surveillance of colon cancer   2. Chemotherapy-induced peripheral neuropathy (HCC)       Dr. Randa Evens, MD, MPH [facility] [phone] 03/15/2020 4:59 PM

## 2020-03-15 NOTE — Progress Notes (Signed)
Error

## 2020-03-24 ENCOUNTER — Encounter: Payer: Self-pay | Admitting: Physician Assistant

## 2020-03-24 ENCOUNTER — Ambulatory Visit: Payer: BC Managed Care – PPO | Admitting: Physician Assistant

## 2020-03-24 ENCOUNTER — Other Ambulatory Visit: Payer: Self-pay

## 2020-03-24 DIAGNOSIS — Z113 Encounter for screening for infections with a predominantly sexual mode of transmission: Secondary | ICD-10-CM

## 2020-03-24 DIAGNOSIS — A630 Anogenital (venereal) warts: Secondary | ICD-10-CM

## 2020-03-24 NOTE — Progress Notes (Signed)
S: Patient comes into clinic today for repeat cryotx.  Denies any problems with previous treatments.  Reports that the HPV lesions have significantly improved since the last treatment on 03/14/20.  Marked areas with persistent small warts with a black marker. Desires to proceed with cryotx today. O: WDWN male in NAD, A&O x 3; skin is warm and dry with 4 ~1-72mm flesh colored NT lesions at the base of the penis on right side.  No edema, erythema, or ulcerative lesions present today. A/P: genital HPV lesions, with Cryo treatment in 3 freeze/thaw cycles today.  Patient tolerated procedure well. Reviewed with patient after-care instructions and when to call clinic. No sex until area has completely healed. Rec condoms with all sex RTC in 10-14 days for next treatment if needed. Offered flu vaccine - pt declined. Enc to get COVID booster when eligible.

## 2020-04-06 ENCOUNTER — Ambulatory Visit: Payer: BC Managed Care – PPO | Admitting: Family Medicine

## 2020-04-06 ENCOUNTER — Encounter: Payer: Self-pay | Admitting: Family Medicine

## 2020-04-06 ENCOUNTER — Ambulatory Visit: Payer: Self-pay

## 2020-04-06 ENCOUNTER — Ambulatory Visit: Payer: BC Managed Care – PPO

## 2020-04-06 ENCOUNTER — Other Ambulatory Visit: Payer: Self-pay

## 2020-04-06 DIAGNOSIS — B977 Papillomavirus as the cause of diseases classified elsewhere: Secondary | ICD-10-CM

## 2020-04-06 DIAGNOSIS — Z113 Encounter for screening for infections with a predominantly sexual mode of transmission: Secondary | ICD-10-CM

## 2020-04-06 DIAGNOSIS — A630 Anogenital (venereal) warts: Secondary | ICD-10-CM

## 2020-04-06 NOTE — Progress Notes (Signed)
S: Patient in clinic for repeat Cryo treatment.  Denies any problems with previous treatments.    O: Patient has 2 ~46mm flesh colored lesions at the base of the penis, marked with black ink.  No edema or redness or ulcer at marked site.   A: Patient to be treated today with Cyro therapy.   P: Cryo treatment in 3 freeze/thaw cycles today, performed by C. [place], [place] and observed by this provider.  Patient tolerated procedure well. -Reviewed with patient after-care instructions and when to call clinic. -No sex until area has completely healed. -Rec condoms with all sex. -RTC in 10-14 days for next treatment if needed.  Patient tolerated procedure well.

## 2020-04-06 NOTE — Progress Notes (Signed)
Pt seen in [facility] and he explains he is here for cryo treatment  today. RN scheduled pt  for provider in [facility] clinic and RN escorted pt to [facility]. Josie Saunders, RN

## 2020-04-11 ENCOUNTER — Ambulatory Visit: Payer: BC Managed Care – PPO | Attending: Radiation Oncology | Admitting: Radiation Oncology

## 2020-04-18 ENCOUNTER — Ambulatory Visit (INDEPENDENT_AMBULATORY_CARE_PROVIDER_SITE_OTHER): Payer: Self-pay | Admitting: Gastroenterology

## 2020-04-18 ENCOUNTER — Other Ambulatory Visit: Payer: Self-pay

## 2020-04-18 ENCOUNTER — Ambulatory Visit: Payer: BC Managed Care – PPO | Admitting: Physician Assistant

## 2020-04-18 ENCOUNTER — Encounter: Payer: Self-pay | Admitting: Gastroenterology

## 2020-04-18 VITALS — BP 116/71 | HR 69 | Ht 73.0 in | Wt 158.2 lb

## 2020-04-18 DIAGNOSIS — Z85038 Personal history of other malignant neoplasm of large intestine: Secondary | ICD-10-CM | POA: Insufficient documentation

## 2020-04-18 DIAGNOSIS — B977 Papillomavirus as the cause of diseases classified elsewhere: Secondary | ICD-10-CM

## 2020-04-18 NOTE — Progress Notes (Signed)
Tristan [name] [id number] y.o. comes into clinic today for cryotx.  Denies any problems with previous treatments.  Reports that the HPV have improved since the last treatment.  Desires to proceed with cryotx today. WDWN male in NAD, A&O x 3; skin is warm and dry with  2 ~2 mm HPV at base of penis on left side.  No edema, erythema, or ulcerative lesions present today. Cryo treatment in 3 freeze/thaw cycles today.  Patient tolerated procedure well. Reviewed with patient after-care instructions and when to call clinic. No sex until area has completely healed. Rec condoms with all sex RTC in 10-14 days for next treatment if needed.

## 2020-04-18 NOTE — Progress Notes (Signed)
Tristan Bellows MD, MRCP(U.K) [address]  [place]  [place], [place] [address]  Main: [phone]  Fax: [phone]   Primary Care Physician: Patient, No Pcp Per  Primary Gastroenterologist:  Dr. Jonathon [name]   History of Present Illness: Here to schedule colonoscopy after surgery  HPI: Tristan [name] is a 40 y.o. male   Summary of history :  Initially evaluated in 2018 for abdominal pain. He had a CT scan of the abdomen which showed inflammation of the sigmoid colon extending into the rectum.  A colonoscopy was attempted but it was very tight and tortuous sigmoid colon which I could not go past hence had to abort the procedure. I requested for a CT colonography but was denied by the insurance. . I subsequently requested a barium enema to evaluate the sigmoid colon but he did not obtain the barium enema .  Subsequently lost to follow-up due to issues with insurance  Presented to the emergency room on 11/01/2018 with right lower quadrant pain, CT scan of the abdomen which showed a masslike wall thickening involving the cecum and ascending colon, terminal ileum with surrounding inflammatory changes. 01/09/2019 barium enema: A very large irregular apple core lesion involving the lower portion of the right colon and cecum was noted these findings are most consistent with colon cancer correlate with CT findings   Interval history11/04/2018-04/18/2020  01/30/2019: I attempted a colonoscopy in the OR and found severe stenosis in the sigmoid colon which could not be traversed and underwent surgery at the same time in the OR  Underwent right hemicolectomy on 01/29/2021 and the pathology showed a moderately differentiated adenocarcinoma with invasion of the visceral peritoneum and 2 out of 3 lymph nodes were positive.  Distal ileum segment showed neuroendocrine tumor.  Subsequently followed with oncology.  He has been referred back to see me for surveillance colonoscopy  He has  been doing well since his surgery.  Denies any difficulty having a bowel movement.  Walking at [place] denies any abdominal pain. No current outpatient medications on file.   No current facility-administered medications for this visit.   Facility-Administered Medications Ordered in Other Visits  Medication Dose Route Frequency Provider Last Rate Last Admin  . sodium chloride flush (NS) 0.9 % injection 10 mL  10 mL Intravenous PRN Sindy Guadeloupe, MD   10 mL at 11/09/19 1537    Allergies as of 04/18/2020  . (No Known Allergies)    ROS:  General: Negative for anorexia, weight loss, fever, chills, fatigue, weakness. ENT: Negative for hoarseness, difficulty swallowing , nasal congestion. CV: Negative for chest pain, angina, palpitations, dyspnea on exertion, peripheral edema.  Respiratory: Negative for dyspnea at rest, dyspnea on exertion, cough, sputum, wheezing.  GI: See history of present illness. GU:  Negative for dysuria, hematuria, urinary incontinence, urinary frequency, nocturnal urination.  Endo: Negative for unusual weight change.    Physical Examination:   BP 116/71 (BP Location: Right Arm, Patient Position: Sitting, Cuff Size: Normal)   Pulse 69   Ht 6\' 1"  (1.854 m)   Wt 158 lb 3.2 oz (71.8 kg)   BMI 20.87 kg/m   General: Well-nourished, well-developed in no acute distress.  Eyes: No icterus. Conjunctivae pink. Mouth: Oropharyngeal mucosa moist and pink , no lesions erythema or exudate. Lungs: Clear to auscultation bilaterally. Non-labored. Heart: Regular rate and rhythm, no murmurs rubs or gallops.  Abdomen: Bowel sounds are normal, nontender, nondistended, no hepatosplenomegaly or masses, no abdominal bruits or hernia , no  rebound or guarding.   Extremities: No lower extremity edema. No clubbing or deformities. Neuro: Alert and oriented x 3.  Grossly intact. Skin: Warm and dry, no jaundice.   Psych: Alert and cooperative, normal mood and affect.   Imaging  Studies: No results found.  Assessment and Plan:   Tristan [name] is a 40 y.o. y/o male diagnosed with stage III colon cancer s/p surgery and chemotherapy.  Neuroendocrine tumor of the distal ileum status post resection.  He has been referred back to see me to discuss about a colonoscopy to evaluate the colon as he has not had a complete one previously to rule out any other polyps elsewhere.  We will go ahead and schedule the procedure   I have discussed alternative options, risks & benefits,  which include, but are not limited to, bleeding, infection, perforation,respiratory complication & drug reaction.  The patient agrees with this plan & written consent will be obtained.     Dr Tristan Bellows  MD,MRCP [facility]) Follow up in as needed

## 2020-04-29 ENCOUNTER — Other Ambulatory Visit
Admission: RE | Admit: 2020-04-29 | Discharge: 2020-04-29 | Disposition: A | Discharge status: Home / Self Care | Payer: BC Managed Care – PPO | Source: Ambulatory Visit | Attending: Gastroenterology | Admitting: Gastroenterology

## 2020-04-29 ENCOUNTER — Other Ambulatory Visit: Payer: Self-pay

## 2020-04-29 DIAGNOSIS — Z20822 Contact with and (suspected) exposure to covid-19: Secondary | ICD-10-CM | POA: Diagnosis not present

## 2020-04-29 DIAGNOSIS — Z01812 Encounter for preprocedural laboratory examination: Secondary | ICD-10-CM | POA: Diagnosis not present

## 2020-04-29 LAB — SARS CORONAVIRUS 2 (TAT 6-24 HRS): SARS Coronavirus 2: NEGATIVE

## 2020-05-03 ENCOUNTER — Ambulatory Visit: Payer: BC Managed Care – PPO | Admitting: Certified Registered Nurse Anesthetist

## 2020-05-03 ENCOUNTER — Encounter
Admission: RE | Disposition: A | Discharge status: Home / Self Care | Payer: Self-pay | Source: Home / Self Care | Attending: Gastroenterology

## 2020-05-03 ENCOUNTER — Encounter: Payer: Self-pay | Admitting: Gastroenterology

## 2020-05-03 ENCOUNTER — Ambulatory Visit
Admission: RE | Admit: 2020-05-03 | Discharge: 2020-05-03 | Disposition: A | Discharge status: Home / Self Care | Payer: BC Managed Care – PPO | Attending: Gastroenterology | Admitting: Gastroenterology

## 2020-05-03 DIAGNOSIS — Z08 Encounter for follow-up examination after completed treatment for malignant neoplasm: Secondary | ICD-10-CM | POA: Diagnosis present

## 2020-05-03 DIAGNOSIS — F17210 Nicotine dependence, cigarettes, uncomplicated: Secondary | ICD-10-CM | POA: Diagnosis not present

## 2020-05-03 DIAGNOSIS — Z800 Family history of malignant neoplasm of digestive organs: Secondary | ICD-10-CM | POA: Diagnosis not present

## 2020-05-03 DIAGNOSIS — Z980 Intestinal bypass and anastomosis status: Secondary | ICD-10-CM | POA: Diagnosis not present

## 2020-05-03 DIAGNOSIS — Z85038 Personal history of other malignant neoplasm of large intestine: Secondary | ICD-10-CM | POA: Diagnosis not present

## 2020-05-03 HISTORY — PX: COLONOSCOPY WITH PROPOFOL: SHX5780

## 2020-05-03 SURGERY — COLONOSCOPY WITH PROPOFOL
Anesthesia: General

## 2020-05-03 MED ORDER — LIDOCAINE HCL (PF) 2 % IJ SOLN
INTRAMUSCULAR | Status: AC
  Filled 2020-05-03: qty 5

## 2020-05-03 MED ORDER — LIDOCAINE HCL (CARDIAC) PF 100 MG/5ML IV SOSY
PREFILLED_SYRINGE | INTRAVENOUS | Status: DC | PRN
  Administered 2020-05-03: 50 mg via INTRAVENOUS

## 2020-05-03 MED ORDER — PROPOFOL 500 MG/50ML IV EMUL
INTRAVENOUS | Status: AC
  Filled 2020-05-03: qty 50

## 2020-05-03 MED ORDER — MIDAZOLAM HCL 2 MG/2ML IJ SOLN
INTRAMUSCULAR | Status: DC | PRN
  Administered 2020-05-03: 2 mg via INTRAVENOUS

## 2020-05-03 MED ORDER — PROPOFOL 500 MG/50ML IV EMUL
INTRAVENOUS | Status: DC | PRN
  Administered 2020-05-03: 150 ug/kg/min via INTRAVENOUS

## 2020-05-03 MED ORDER — PROPOFOL 10 MG/ML IV BOLUS
INTRAVENOUS | Status: DC | PRN
  Administered 2020-05-03: 80 mg via INTRAVENOUS
  Administered 2020-05-03: 30 mg via INTRAVENOUS

## 2020-05-03 MED ORDER — MIDAZOLAM HCL 2 MG/2ML IJ SOLN
INTRAMUSCULAR | Status: AC
  Filled 2020-05-03: qty 2

## 2020-05-03 MED ORDER — SODIUM CHLORIDE 0.9 % IV SOLN: INTRAVENOUS | Status: DC

## 2020-05-03 NOTE — H&P (Signed)
Tristan Bellows, MD [address], [place], [place], [place], [address] [address], [place], [place], [place], [address] Phone: [phone]  Fax: [phone]  Primary Care Physician:  Patient, No Pcp Per   Pre-Procedure History & Physical: HPI:  Tristan [name] is a 40 y.o. male is here for an colonoscopy.   Past Medical History:  Diagnosis Date  . Abdominal pain   . Anemia   . Asthma   . Colon cancer ([place])   . Family history of breast cancer   . Family history of colon cancer   . GERD (gastroesophageal reflux disease)   . Nausea   . Poor appetite   . Weight loss     Past Surgical History:  Procedure Laterality Date  . COLONOSCOPY N/A 01/30/2019   Procedure: COLONOSCOPY;  Surgeon: Jules Husbands, MD;  Location: [facility] ORS;  Service: General;  Laterality: N/A;  . COLONOSCOPY WITH PROPOFOL N/A 08/16/2016   Procedure: COLONOSCOPY WITH PROPOFOL;  Surgeon: Tristan Bellows, MD;  Location: [facility] ENDOSCOPY;  Service: Endoscopy;  Laterality: N/A;  . COLONOSCOPY WITH PROPOFOL N/A 01/30/2019   Procedure: COLONOSCOPY WITH PROPOFOL;  Surgeon: Tristan Bellows, MD;  Location: [facility] ENDOSCOPY;  Service: Gastroenterology;  Laterality: N/A;  TRAVEL CASE TO O.R. - PROCEDURE TO START AT 7:30 AM IN O.R.  . LAPAROSCOPIC RIGHT COLECTOMY N/A 01/30/2019   Procedure: LAPAROSCOPIC HAND ASSISTED RIGHT COLECTOMY CONVERTED TO OPEN PROCEDURE;  Surgeon: Jules Husbands, MD;  Location: [facility] ORS;  Service: General;  Laterality: N/A;  . NO PAST SURGERIES    . PORTACATH PLACEMENT Right 03/04/2019   Procedure: INSERTION PORT-A-CATH;  Surgeon: Jules Husbands, MD;  Location: [facility] ORS;  Service: General;  Laterality: Right;    Prior to Admission medications   Not on File    Allergies as of 04/19/2020  . (No Known Allergies)    Family History  Problem Relation Age of Onset  . Diabetes Mother 70       Type 1  . Breast cancer Mother 49  . Healthy Father   . Colon polyps Father   . Ulcerative colitis Paternal  Aunt   . Multiple sclerosis Paternal Aunt   . Dementia Paternal Uncle   . Breast cancer Maternal Grandmother 62  . Breast cancer Paternal Grandmother 68  . Rectal cancer Maternal Great-grandmother   . Colon cancer Cousin     Social History   Socioeconomic History  . Marital status: Single    Spouse name: Not on file  . Number of children: Not on file  . Years of education: Not on file  . Highest education level: Not on file  Occupational History  . Not on file  Tobacco Use  . Smoking status: Current Every Day Smoker    Packs/day: 0.50    Types: Cigarettes  . Smokeless tobacco: Never Used  Vaping Use  . Vaping Use: Never used  Substance and Sexual Activity  . Alcohol use: Not Currently    Comment: off weeks of chemo 1-2 beers  . Drug use: Yes    Types: Marijuana  . Sexual activity: Yes    Birth control/protection: Condom  Other Topics Concern  . Not on file  Social History Narrative  . Not on file   Social Determinants of Health   Financial Resource Strain: Not on file  Food Insecurity: Not on file  Transportation Needs: Not on file  Physical Activity: Not on file  Stress: Not on file  Social Connections: Not  on file  Intimate Partner Violence: Not on file    Review of Systems: See HPI, otherwise negative ROS  Physical Exam: BP 126/71   Pulse 68   Temp 97.6 F (36.4 C) (Temporal)   Resp 17   Ht 6\' 1"  (1.854 m)   Wt 70.3 kg   SpO2 99%   BMI 20.45 kg/m  General:   Alert,  pleasant and cooperative in NAD Head:  Normocephalic and atraumatic. Neck:  Supple; no masses or thyromegaly. Lungs:  Clear throughout to auscultation, normal respiratory effort.    Heart:  +S1, +S2, Regular rate and rhythm, No edema. Abdomen:  Soft, nontender and nondistended. Normal bowel sounds, without guarding, and without rebound.   Neurologic:  Alert and  oriented x4;  grossly normal neurologically.  Impression/Plan: Tristan [name] is here for an colonoscopy to be  performed for [employer] due to prior history of colon cancer.  Risks, benefits, limitations, and alternatives regarding  colonoscopy have been reviewed with the patient.  Questions have been answered.  All parties agreeable.   Tristan Bellows, MD  05/03/2020, 9:40 AM

## 2020-05-03 NOTE — Anesthesia Preprocedure Evaluation (Signed)
Anesthesia Evaluation  Patient identified by MRN, date of birth, ID band Patient awake    Reviewed: Allergy & Precautions, NPO status , Patient's Chart, lab work & pertinent test results  History of Anesthesia Complications Negative for: history of anesthetic complications  Airway Mallampati: II  TM Distance: >3 FB Neck ROM: Full    Dental  (+) Poor Dentition   Pulmonary neg sleep apnea, neg COPD, Current Smoker and Patient abstained from smoking.,    Pulmonary exam normal        Cardiovascular Exercise Tolerance: Good (-) hypertension(-) Past MI and (-) CHF negative cardio ROS Normal cardiovascular exam(-) dysrhythmias (-) Valvular Problems/Murmurs     Neuro/Psych neg Seizures    GI/Hepatic Bowel prep,GERD (OTC meds)  ,(+)     substance abuse  marijuana use,   Endo/Other  neg diabetes  Renal/GU negative Renal ROS     Musculoskeletal   Abdominal   Peds  Hematology  (+) anemia ,   Anesthesia Other Findings  Abdominal pain  . Anemia  . Asthma  . Colon cancer ([place])   . GERD  . Nausea  . Poor appetite  . Weight loss     Reproductive/Obstetrics                             Anesthesia Physical  Anesthesia Plan  ASA: III  Anesthesia Plan: General   Post-op Pain Management:    Induction: Intravenous  PONV Risk Score and Plan: 1 and Propofol infusion and TIVA  Airway Management Planned: Natural Airway and Nasal Cannula  Additional Equipment:   Intra-op Plan:   Post-operative Plan:   Informed Consent: I have reviewed the patients History and Physical, chart, labs and discussed the procedure including the risks, benefits and alternatives for the proposed anesthesia with the patient or authorized representative who has indicated his/her understanding and acceptance.       Plan Discussed with: CRNA and Anesthesiologist  Anesthesia Plan Comments:          Anesthesia Quick Evaluation

## 2020-05-03 NOTE — Anesthesia Procedure Notes (Signed)
Date/Time: 05/03/2020 9:47 AM Performed by: Johnna Acosta, CRNA Pre-anesthesia Checklist: Patient identified, Emergency Drugs available, Suction available, Patient being monitored and Timeout performed Patient Re-evaluated:Patient Re-evaluated prior to induction Oxygen Delivery Method: Nasal cannula Preoxygenation: Pre-oxygenation with 100% oxygen Induction Type: IV induction

## 2020-05-03 NOTE — Progress Notes (Signed)
Patient refused to have more than two sets of vital signs taken, stating "my eyes are open and I feel fine, I am leaving".  Vital signs were stable and at the patient's baseline.  IV removed and well as monitoring devices.  Discharge teaching provided, patient verbalized understanding and signed discharge papers. Our volunteer, Mr. Linna Hoff was transporting the patient via a wheelchair for discharge when the patient had him to stop and the patient got out of the wheelchair, prior to reaching the elevators on the 2nd floor, and stated, "I would rather walk to the door".  The patient refused the wheelchair and continued to walk on.

## 2020-05-03 NOTE — Anesthesia Postprocedure Evaluation (Signed)
Anesthesia Post Note  Patient: Tristan [name]  Procedure(s) Performed: COLONOSCOPY WITH PROPOFOL (N/A )  Patient location during evaluation: Phase II Anesthesia Type: General Level of consciousness: awake and alert, awake and oriented Pain management: pain level controlled Vital Signs Assessment: post-procedure vital signs reviewed and stable Respiratory status: spontaneous breathing, nonlabored ventilation and respiratory function stable Cardiovascular status: blood pressure returned to baseline and stable Postop Assessment: no apparent nausea or vomiting Anesthetic complications: no   No complications documented.   Last Vitals:  Vitals:   05/03/20 1018 05/03/20 1027  BP: 105/60 111/70  Pulse: 69 70  Resp: (!) 21 18  Temp:    SpO2: 100% 99%    Last Pain:  Vitals:   05/03/20 1027  TempSrc:   PainSc: 0-No pain                 Phill Mutter

## 2020-05-03 NOTE — OR Nursing (Signed)
Surgical Anastomosis reached at 1004.

## 2020-05-03 NOTE — Transfer of Care (Signed)
Immediate Anesthesia Transfer of Care Note  Patient: Tristan [name]  Procedure(s) Performed: COLONOSCOPY WITH PROPOFOL (N/A )  Patient Location: PACU  Anesthesia Type:General  Level of Consciousness: awake, alert  and oriented  Airway & Oxygen Therapy: Patient Spontanous Breathing  Post-op Assessment: Report given to RN and Post -op Vital signs reviewed and stable  Post vital signs: Reviewed and stable  Last Vitals:  Vitals Value Taken Time  BP 105/60 05/03/20 1018  Temp 36.4 C 05/03/20 1017  Pulse 69 05/03/20 1018  Resp 21 05/03/20 1018  SpO2 100 % 05/03/20 1018    Last Pain:  Vitals:   05/03/20 1017  TempSrc: Temporal  PainSc: 0-No pain         Complications: No complications documented.

## 2020-05-03 NOTE — Op Note (Signed)
[facility] Patient Name: Budd Freiermuth Procedure Date: 05/03/2020 9:38 AM MRN: [id number] Account #: [id number] Date of Birth: 27-Apr-1980 Admit Type: Outpatient Age: 40 Room: [facility] ENDO ROOM 2 Gender: Male Note Status: Finalized Procedure:             Colonoscopy Indications:           High risk colon cancer surveillance: Personal history                         of colon cancer, Last colonoscopy: November 2020 Providers:             [name] MD, MD Referring MD:          No Local Md, MD (Referring MD) Medicines:             Monitored Anesthesia Care Complications:         No immediate complications. Procedure:             Pre-Anesthesia Assessment:                        - Prior to the procedure, a History and Physical was                         performed, and patient medications, allergies and                         sensitivities were reviewed. The patient's tolerance                         of previous anesthesia was reviewed.                        - The risks and benefits of the procedure and the                         sedation options and risks were discussed with the                         patient. All questions were answered and informed                         consent was obtained.                        - ASA Grade Assessment: II - A patient with mild                         systemic disease.                        After obtaining informed consent, the colonoscope was                         passed under direct vision. Throughout the procedure,                         the patient's blood pressure, pulse, and oxygen                         saturations were  monitored continuously. The                         Colonoscope was introduced through the anus and                         advanced to the the ileocolonic anastomosis. The                         colonoscopy was technically difficult and complex due                         to  significant looping. Successful completion of the                         procedure was aided by withdrawing the scope and                         replacing with the adult endoscope. The patient                         tolerated the procedure well. The quality of the bowel                         preparation was excellent. Findings:      The perianal and digital rectal examinations were normal.      There was evidence of a prior end-to-side ileo-colonic anastomosis in       the ascending colon and in the cecum. This was patent and was       characterized by healthy appearing mucosa, ulceration ( noted at staple       line)and an intact staple line. [Traversed]. Biopsies were taken with a       cold forceps for histology.      The exam was otherwise without abnormality on direct and retroflexion       views. Impression:            - Patent end-to-side ileo-colonic anastomosis,                         characterized by healthy appearing mucosa, ulceration                         and an intact staple line. Biopsied.                        - The examination was otherwise normal on direct and                         retroflexion views. Recommendation:        - Discharge patient to home (with escort).                        - Resume previous diet.                        - Continue present medications.                        - Repeat colonoscopy in 3 years for surveillance. Procedure Code(s):     ---  Professional ---                        [phone], Colonoscopy, flexible; with biopsy, single or                         multiple Diagnosis Code(s):     --- Professional ---                        Z85.038, Personal history of other malignant neoplasm                         of large intestine                        Z98.0, Intestinal bypass and anastomosis status CPT copyright 2019 American Medical Association. All rights reserved. The codes documented in this report are preliminary and upon coder  review may  be revised to meet current compliance requirements. [name], MD [name] MD, MD 05/03/2020 10:16:44 AM This report has been signed electronically. Number of Addenda: 0 Note Initiated On: 05/03/2020 9:38 AM Scope Withdrawal Time: 0 hours 6 minutes 52 seconds  Total Procedure Duration: 0 hours 22 minutes 14 seconds  Estimated Blood Loss:  Estimated blood loss: none.      [facility]

## 2020-05-04 ENCOUNTER — Encounter: Payer: Self-pay | Admitting: Gastroenterology

## 2020-05-04 LAB — SURGICAL PATHOLOGY

## 2020-05-05 ENCOUNTER — Telehealth: Payer: Self-pay

## 2020-05-05 NOTE — Telephone Encounter (Signed)
-----   Message from [name], MD sent at 05/04/2020 11:53 AM EST ----- Inform the patient that the biopsies taken during his colonoscopy confirmed that the inflammation is from an ulcer which is likely due to the [facility] powders that he has been taking which I told him to stop taking yesterday.  Would suggest to stop all NSAID usage otherwise it can cause further ulceration of the colon and may Bleed or cause perforation

## 2020-06-07 ENCOUNTER — Ambulatory Visit: Payer: Self-pay | Admitting: Nurse Practitioner

## 2020-06-14 ENCOUNTER — Encounter: Payer: Self-pay | Admitting: Oncology

## 2020-06-14 ENCOUNTER — Other Ambulatory Visit: Payer: Self-pay

## 2020-06-14 ENCOUNTER — Inpatient Hospital Stay (HOSPITAL_BASED_OUTPATIENT_CLINIC_OR_DEPARTMENT_OTHER): Payer: BC Managed Care – PPO | Admitting: Oncology

## 2020-06-14 ENCOUNTER — Inpatient Hospital Stay: Payer: BC Managed Care – PPO | Attending: Oncology

## 2020-06-14 DIAGNOSIS — Z79899 Other long term (current) drug therapy: Secondary | ICD-10-CM | POA: Diagnosis not present

## 2020-06-14 DIAGNOSIS — Z9049 Acquired absence of other specified parts of digestive tract: Secondary | ICD-10-CM | POA: Diagnosis not present

## 2020-06-14 DIAGNOSIS — Z803 Family history of malignant neoplasm of breast: Secondary | ICD-10-CM | POA: Diagnosis not present

## 2020-06-14 DIAGNOSIS — L299 Pruritus, unspecified: Secondary | ICD-10-CM | POA: Insufficient documentation

## 2020-06-14 DIAGNOSIS — T451X5A Adverse effect of antineoplastic and immunosuppressive drugs, initial encounter: Secondary | ICD-10-CM

## 2020-06-14 DIAGNOSIS — Z9221 Personal history of antineoplastic chemotherapy: Secondary | ICD-10-CM | POA: Insufficient documentation

## 2020-06-14 DIAGNOSIS — Z85038 Personal history of other malignant neoplasm of large intestine: Secondary | ICD-10-CM | POA: Diagnosis present

## 2020-06-14 DIAGNOSIS — Z08 Encounter for follow-up examination after completed treatment for malignant neoplasm: Secondary | ICD-10-CM

## 2020-06-14 DIAGNOSIS — C189 Malignant neoplasm of colon, unspecified: Secondary | ICD-10-CM

## 2020-06-14 DIAGNOSIS — Z923 Personal history of irradiation: Secondary | ICD-10-CM | POA: Insufficient documentation

## 2020-06-14 DIAGNOSIS — F419 Anxiety disorder, unspecified: Secondary | ICD-10-CM | POA: Diagnosis not present

## 2020-06-14 DIAGNOSIS — R21 Rash and other nonspecific skin eruption: Secondary | ICD-10-CM | POA: Insufficient documentation

## 2020-06-14 DIAGNOSIS — K219 Gastro-esophageal reflux disease without esophagitis: Secondary | ICD-10-CM | POA: Insufficient documentation

## 2020-06-14 DIAGNOSIS — F17210 Nicotine dependence, cigarettes, uncomplicated: Secondary | ICD-10-CM | POA: Diagnosis not present

## 2020-06-14 LAB — COMPREHENSIVE METABOLIC PANEL
ALT: 17 U/L (ref 0–44)
AST: 24 U/L (ref 15–41)
Albumin: 4.1 g/dL (ref 3.5–5.0)
Alkaline Phosphatase: 73 U/L (ref 38–126)
Anion gap: 9 (ref 5–15)
BUN: 11 mg/dL (ref 6–20)
CO2: 29 mmol/L (ref 22–32)
Calcium: 9.2 mg/dL (ref 8.9–10.3)
Chloride: 100 mmol/L (ref 98–111)
Creatinine, Ser: 1.04 mg/dL (ref 0.61–1.24)
GFR, Estimated: >60 mL/min (ref >60)
Glucose, Bld: 107 mg/dL — ABNORMAL HIGH (ref 70–99)
Potassium: 4.1 mmol/L (ref 3.5–5.1)
Sodium: 138 mmol/L (ref 135–145)
Total Bilirubin: 0.6 mg/dL (ref 0.3–1.2)
Total Protein: 6.9 g/dL (ref 6.5–8.1)

## 2020-06-14 LAB — CBC WITH DIFFERENTIAL/PLATELET
Abs Immature Granulocytes: 0.02 10*3/uL (ref 0.00–0.07)
Basophils Absolute: 0 10*3/uL (ref 0.0–0.1)
Basophils Relative: 1 %
Eosinophils Absolute: 0.1 10*3/uL (ref 0.0–0.5)
Eosinophils Relative: 2 %
HCT: 39.2 % (ref 39.0–52.0)
Hemoglobin: 12.9 g/dL — ABNORMAL LOW (ref 13.0–17.0)
Immature Granulocytes: 0 %
Lymphocytes Relative: 13 %
Lymphs Abs: 0.7 10*3/uL (ref 0.7–4.0)
MCH: 29.2 pg (ref 26.0–34.0)
MCHC: 32.9 g/dL (ref 30.0–36.0)
MCV: 88.7 fL (ref 80.0–100.0)
Monocytes Absolute: 0.6 10*3/uL (ref 0.1–1.0)
Monocytes Relative: 10 %
Neutro Abs: 4.1 10*3/uL (ref 1.7–7.7)
Neutrophils Relative %: 74 %
Platelets: 173 10*3/uL (ref 150–400)
RBC: 4.42 MIL/uL (ref 4.22–5.81)
RDW: 14.6 % (ref 11.5–15.5)
WBC: 5.5 10*3/uL (ref 4.0–10.5)
nRBC: 0 % (ref 0.0–0.2)

## 2020-06-14 MED ORDER — SERTRALINE HCL 25 MG PO TABS: 25.0000 mg | ORAL_TABLET | Freq: Every day | ORAL | 0 refills | Status: DC

## 2020-06-14 NOTE — Progress Notes (Signed)
Pt is ok from colon cancer but has 2 issues. He has sores to come up on him and then it itches and he scratches and he has them on leg, arms and other parts. He feels it is anxiety. He can remember being in hospital for his cancer and he did not have any anxiety. At work he is so busy that he does not have anxiety but all the other times he does. He has 1 son with ADD, another son that is ok. 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 He works long hours every weekend so he can be with kids for school and games. Constantly on the go. He also has times that he feels like his heart beat is irregular. Sometimes when he is resting he can feel it. He has gone to dermatologist and it was not a dermatological issue. He would like something to calm him down and then he feels like the sores/spots would not come up and then he would not scratch them. He is suppose to call to some office in April to see if he can get PCP.

## 2020-06-15 LAB — CEA: CEA: 3.2 ng/mL (ref 0.0–4.7)

## 2020-06-19 NOTE — Progress Notes (Signed)
Hematology/Oncology Consult note [facility]  Telephone:([phone] [phone]  Patient Care Team: Patient, No Pcp Per (Inactive) as PCP - General (General Practice) Clent Jacks, RN as Oncology Nurse Navigator Sindy Guadeloupe, MD as Consulting Physician (Oncology) Noreene Filbert, MD as Radiation Oncologist (Radiation Oncology) Jules Husbands, MD as Consulting Physician (General Surgery)   Name of the patient: Tristan [name]  [id number]  [birth year]   Date of visit: 06/19/20  Diagnosis- 1. 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 Vicente Males back in July 2018 for abdominal pain. At that time CT showed inflammation of the sigmoid colon extending into the rectum. He was given a course of antibiotics and was asked to follow-up with GI. A colonoscopy was attempted at that time but was very hard to get past the sigmoid colon as it was tight and tortuous. Rectal biopsies at that time were normal. CT colonography and barium enema was recommended but patient was lost to follow-up. He then presented to the emergency room in August 2020 with symptoms of right lower quadrant abdominal pain. CT showed masslike thickening involving the cecum and the ascending colon the terminal ileum with surrounding inflammatory changes differentials included infectious/inflammatory enterocolitis as well as colonic neoplasm. Since it was difficult to do a colonoscopy a barium enema was performed on 01/09/2019 which showed a large irregular apple core lesion involving the lower portion of the right colon. Since it was difficult for the patient to undergo colonoscopy he was referred to Dr. Adora Fridge for definitive surgical management.  Patient underwent right hemicolectomy  on 01/30/2019. 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 With invasion of visceral peritoneum with findings of peritonitis adhesions and abscesses. Metastatic carcinoma in 2 out of 245 regional lymph nodes. Margins were negative. PT4PN1. Appendix encased in adhesions with adjacent abscess. MSI testing showed low probability of MSI high. 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 Ki-67 less than 3%  Adjuvant FOLFOX chemotherapycompleted on 08/04/2019. Patient is undergoing adjuvant radiation treatment as well given T4 lesion. Neuroendocrine tumor is being monitored   Interval history-she reports feeling anxious from personal stressors.  Also reports generalized itching and constant need to scratch all over.  ECOG PS-1 Pain scale- 0   Review of systems- Review of Systems  Constitutional: Negative for chills, fever, malaise/fatigue and weight loss.  HENT: Negative for congestion, ear discharge and nosebleeds.   Eyes: Negative for blurred vision.  Respiratory: Negative for cough, hemoptysis, sputum production, shortness of breath and wheezing.   Cardiovascular: Negative for chest pain, palpitations, orthopnea and claudication.  Gastrointestinal: Negative for abdominal pain, blood in stool, constipation, diarrhea, heartburn, melena, nausea and vomiting.  Genitourinary: Negative for dysuria, flank pain, frequency, hematuria and urgency.  Musculoskeletal: Negative for back pain, joint pain and myalgias.  Skin: Positive for itching. Negative for rash.  Neurological: Negative for dizziness, tingling, focal weakness, seizures, weakness and headaches.  Endo/Heme/Allergies: Does not bruise/bleed easily.  Psychiatric/Behavioral: Negative for depression and suicidal ideas. The patient is nervous/anxious. The patient does not have insomnia.       No Known Allergies   Past Medical  History:  Diagnosis Date  . Abdominal pain   . Anemia   . Asthma   . Colon  cancer ([facility])   . Family history of breast cancer   . Family history of colon cancer   . GERD (gastroesophageal reflux disease)   . Nausea   . Poor appetite   . Weight loss      Past Surgical History:  Procedure Laterality Date  . COLONOSCOPY N/A 01/30/2019   Procedure: COLONOSCOPY;  Surgeon: Leafy Ro, MD;  Location: [facility] ORS;  Service: General;  Laterality: N/A;  . COLONOSCOPY WITH PROPOFOL N/A 08/16/2016   Procedure: COLONOSCOPY WITH PROPOFOL;  Surgeon: Wyline Mood, MD;  Location: [facility] ENDOSCOPY;  Service: Endoscopy;  Laterality: N/A;  . COLONOSCOPY WITH PROPOFOL N/A 01/30/2019   Procedure: COLONOSCOPY WITH PROPOFOL;  Surgeon: Wyline Mood, MD;  Location: [facility] ENDOSCOPY;  Service: Gastroenterology;  Laterality: N/A;  TRAVEL CASE TO O.R. - PROCEDURE TO START AT 7:30 AM IN O.R.  . COLONOSCOPY WITH PROPOFOL N/A 05/03/2020   Procedure: COLONOSCOPY WITH PROPOFOL;  Surgeon: Wyline Mood, MD;  Location: [facility] ENDOSCOPY;  Service: Gastroenterology;  Laterality: N/A;  . LAPAROSCOPIC RIGHT COLECTOMY N/A 01/30/2019   Procedure: LAPAROSCOPIC HAND ASSISTED RIGHT COLECTOMY CONVERTED TO OPEN PROCEDURE;  Surgeon: Leafy Ro, MD;  Location: [facility] ORS;  Service: General;  Laterality: N/A;  . NO PAST SURGERIES    . PORTACATH PLACEMENT Right 03/04/2019   Procedure: INSERTION PORT-A-CATH;  Surgeon: Leafy Ro, MD;  Location: [facility] ORS;  Service: General;  Laterality: Right;    Social History   Socioeconomic History  . Marital status: Single    Spouse name: Not on file  . Number of children: Not on file  . Years of education: Not on file  . Highest education level: Not on file  Occupational History  . Not on file  Tobacco Use  . Smoking status: Current Every Day Smoker    Packs/day: 1.00    Types: Cigarettes  . Smokeless tobacco: Never Used  Vaping Use  . Vaping Use: Never used  Substance and Sexual Activity   . Alcohol use: Yes    Comment: maybe 2-3 beers a month  . Drug use: Yes    Types: Marijuana  . Sexual activity: Yes    Birth control/protection: Condom  Other Topics Concern  . Not on file  Social History Narrative  . Not on file   Social Determinants of Health   Financial Resource Strain: Not on file  Food Insecurity: Not on file  Transportation Needs: Not on file  Physical Activity: Not on file  Stress: Not on file  Social Connections: Not on file  Intimate Partner Violence: Not on file    Family History  Problem Relation Age of Onset  . Diabetes Mother 72       Type 1  . Breast cancer Mother 69  . Healthy Father   . Colon polyps Father   . Ulcerative colitis Paternal Aunt   . Multiple sclerosis Paternal Aunt   . Dementia Paternal Uncle   . Breast cancer Maternal Grandmother 90  . Breast cancer Paternal Grandmother 3  . Rectal cancer Maternal Great-grandmother   . Colon cancer Cousin      Current Outpatient Medications:  .  sertraline (ZOLOFT) 25 MG tablet, Take 1 tablet (25 mg total) by mouth daily., Disp: 30 tablet, Rfl: 0 No current facility-administered medications for this visit.  Facility-Administered Medications Ordered in Other Visits:  .  sodium chloride flush (NS) 0.9 % injection 10 mL, 10 mL, Intravenous, PRN, Creig Hines, MD, 10 mL at 11/09/19 1537  Physical  exam: There were no vitals filed for this visit. Physical Exam Constitutional:      General: He is not in acute distress. Cardiovascular:     Rate and Rhythm: Normal rate and regular rhythm.     Heart sounds: Normal heart sounds.  Pulmonary:     Effort: Pulmonary effort is normal.     Breath sounds: Normal breath sounds.  Abdominal:     General: Bowel sounds are normal.     Palpations: Abdomen is soft.  Skin:    General: Skin is warm and dry.  Neurological:     Mental Status: He is alert and oriented to person, place, and time.      CMP Latest Ref Rng & Units 06/14/2020  Glucose  70 - 99 mg/dL 107(H)  BUN 6 - 20 mg/dL 11  Creatinine 0.61 - 1.24 mg/dL 1.04  Sodium 135 - 145 mmol/L 138  Potassium 3.5 - 5.1 mmol/L 4.1  Chloride 98 - 111 mmol/L 100  CO2 22 - 32 mmol/L 29  Calcium 8.9 - 10.3 mg/dL 9.2  Total Protein 6.5 - 8.1 g/dL 6.9  Total Bilirubin 0.3 - 1.2 mg/dL 0.6  Alkaline Phos 38 - 126 U/L 73  AST 15 - 41 U/L 24  ALT 0 - 44 U/L 17   CBC Latest Ref Rng & Units 06/14/2020  WBC 4.0 - 10.5 K/uL 5.5  Hemoglobin 13.0 - 17.0 g/dL 12.9(L)  Hematocrit 39.0 - 52.0 % 39.2  Platelets 150 - 400 K/uL 173     Assessment and plan- Patient is a 40 y.o. male with history of stage III colon cancer here s/p surgery and adjuvant chemotherapy for routine follow-up   From a colon cancer standpoint patient is doing well with no concerning signs and symptoms of recurrence based on today's exam.  He will be due for a repeat CT scan in July 2022 which I will schedule.  Anxiety: He will need to discuss further with his PCP.  I am hesitant to start him on any benzodiazepines.I will start him on 25 mg of Zoloft but I would like him to follow-up with his PCP for this  Pruritus: Patient has a generalized pruritic rash all over his body especially in his legs and chest wall.  Etiology unclear.  He will need to follow-up with dermatology for further management.  I will see him back in July 2022 after CT scans Visit Diagnosis 1. Encounter for follow-up surveillance of colon cancer      Dr. Randa Evens, MD, MPH [facility] at [facility] [phone] 06/19/2020 6:23 PM

## 2020-06-22 ENCOUNTER — Other Ambulatory Visit: Payer: Self-pay | Admitting: *Deleted

## 2020-06-22 DIAGNOSIS — C188 Malignant neoplasm of overlapping sites of colon: Secondary | ICD-10-CM

## 2020-07-27 ENCOUNTER — Telehealth (INDEPENDENT_AMBULATORY_CARE_PROVIDER_SITE_OTHER): Payer: Self-pay

## 2020-07-27 NOTE — Telephone Encounter (Signed)
I attempted to contact the patient to schedule him for a port removal, initially he was through with treatment after 07/30/20. I called and stated who I was and where I was calling from and the patient hung up.

## 2020-07-29 ENCOUNTER — Encounter: Payer: Self-pay | Admitting: Oncology

## 2020-07-29 ENCOUNTER — Other Ambulatory Visit: Payer: Self-pay | Admitting: *Deleted

## 2020-07-29 MED ORDER — SERTRALINE HCL 25 MG PO TABS: 25.0000 mg | ORAL_TABLET | Freq: Every day | ORAL | 0 refills | Status: DC

## 2020-07-29 NOTE — Telephone Encounter (Signed)
Ok to refill. But can we try to get him a pcp?

## 2020-08-04 ENCOUNTER — Telehealth: Payer: Self-pay

## 2020-08-04 NOTE — Telephone Encounter (Signed)
Called pt to let him know that I got him set up with a pcp for a new pt appt on 7/13@ 9:45 am.SJC

## 2020-08-05 ENCOUNTER — Encounter: Payer: Self-pay | Admitting: Oncology

## 2020-08-08 ENCOUNTER — Encounter: Payer: Self-pay | Admitting: Oncology

## 2020-08-09 ENCOUNTER — Encounter: Payer: Self-pay | Admitting: Physician Assistant

## 2020-08-09 ENCOUNTER — Other Ambulatory Visit: Payer: Self-pay

## 2020-08-09 ENCOUNTER — Ambulatory Visit: Payer: Self-pay | Admitting: Physician Assistant

## 2020-08-09 DIAGNOSIS — Z113 Encounter for screening for infections with a predominantly sexual mode of transmission: Secondary | ICD-10-CM

## 2020-08-09 DIAGNOSIS — B977 Papillomavirus as the cause of diseases classified elsewhere: Secondary | ICD-10-CM

## 2020-08-09 NOTE — Progress Notes (Signed)
Tristan [name] [id number] y.o. comes into clinic today for cryotx.  Denies any problems with previous treatments.  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  Desires to proceed with cryotx today. 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  No edema, erythema, or ulcerative lesions present today. A/P:  1.  Patient into clinic for treatment of HPV 2.  Cryo treatment in 3 freeze/thaw cycles today.  Patient tolerated procedure well. 3.  Reviewed with patient after-care instructions and when to call clinic. 4.  No sex until area has completely healed. 5.  Rec condoms with all sex 6.  RTC in 10-14 days for next treatment if needed.

## 2020-08-23 ENCOUNTER — Other Ambulatory Visit: Payer: Self-pay | Admitting: Oncology

## 2020-09-05 ENCOUNTER — Other Ambulatory Visit: Payer: Self-pay

## 2020-09-05 ENCOUNTER — Ambulatory Visit (INDEPENDENT_AMBULATORY_CARE_PROVIDER_SITE_OTHER): Payer: BC Managed Care – PPO | Admitting: Surgery

## 2020-09-05 ENCOUNTER — Encounter: Payer: Self-pay | Admitting: Surgery

## 2020-09-05 VITALS — BP 124/77 | HR 79 | Temp 98.3°F | Ht 73.0 in | Wt 156.6 lb

## 2020-09-05 DIAGNOSIS — K6389 Other specified diseases of intestine: Secondary | ICD-10-CM | POA: Diagnosis not present

## 2020-09-05 NOTE — Patient Instructions (Addendum)
You may try using Fiber supplements to help to bulk up your stools.  Follow up with your PCP Dr Raliegh Ip. On 09/21/20.   Follow-up with our office as needed.  Please call and ask to speak with a nurse if you develop questions or concerns.

## 2020-09-05 NOTE — Progress Notes (Signed)
Outpatient Surgical Follow Up  09/05/2020  Tristan [name] is an 40 y.o. male.   Chief Complaint  Patient presents with   Follow-up    HPI: Tristan [name] is a 40 year old male well-known to me with a history of adenocarcinoma of the right colon status post right colectomy and subsequent chemotherapy.  He is doing very well.  He had completion colonoscopy showing no evidence of recurrence or new synchronous lesions.  He is doing well having bowel movements and has no abdominal pain or complaints this morning  Past Medical History:  Diagnosis Date   Abdominal pain    Anemia    Asthma    Colon cancer ([place])    Family history of breast cancer    Family history of colon cancer    GERD (gastroesophageal reflux disease)    Nausea    Poor appetite    Weight loss     Past Surgical History:  Procedure Laterality Date   COLONOSCOPY N/A 01/30/2019   Procedure: COLONOSCOPY;  Surgeon: Jules Husbands, MD;  Location: [facility] ORS;  Service: General;  Laterality: N/A;   COLONOSCOPY WITH PROPOFOL N/A 08/16/2016   Procedure: COLONOSCOPY WITH PROPOFOL;  Surgeon: [name], MD;  Location: [facility] ENDOSCOPY;  Service: Endoscopy;  Laterality: N/A;   COLONOSCOPY WITH PROPOFOL N/A 01/30/2019   Procedure: COLONOSCOPY WITH PROPOFOL;  Surgeon: [name], MD;  Location: [facility] ENDOSCOPY;  Service: Gastroenterology;  Laterality: N/A;  TRAVEL CASE TO O.R. - PROCEDURE TO START AT 7:30 AM IN O.R.   COLONOSCOPY WITH PROPOFOL N/A 05/03/2020   Procedure: COLONOSCOPY WITH PROPOFOL;  Surgeon: [name], MD;  Location: [facility] ENDOSCOPY;  Service: Gastroenterology;  Laterality: N/A;   LAPAROSCOPIC RIGHT COLECTOMY N/A 01/30/2019   Procedure: LAPAROSCOPIC HAND ASSISTED RIGHT COLECTOMY CONVERTED TO OPEN PROCEDURE;  Surgeon: Jules Husbands, MD;  Location: [facility] ORS;  Service: General;  Laterality: N/A;   NO PAST SURGERIES     PORTACATH PLACEMENT Right 03/04/2019   Procedure: INSERTION PORT-A-CATH;  Surgeon: Jules Husbands, MD;   Location: [facility] ORS;  Service: General;  Laterality: Right;    Family History  Problem Relation Age of Onset   Diabetes Mother 9       Type 1   Breast cancer Mother 18   Healthy Father    Colon polyps Father    Ulcerative colitis Paternal Aunt    Multiple sclerosis Paternal Aunt    Dementia Paternal Uncle    Breast cancer Maternal Grandmother 9   Breast cancer Paternal Grandmother 69   Rectal cancer Maternal Great-grandmother    Colon cancer Cousin     Social History:  reports that he has been smoking cigarettes. He has been smoking an average of 1.00 packs per day. He has never used smokeless tobacco. He reports current alcohol use. He reports current drug use. Drug: Marijuana.  Allergies: No Known Allergies  Medications reviewed.    ROS Full ROS performed and is otherwise negative other than what is stated in HPI   BP 124/77   Pulse 79   Temp 98.3 F (36.8 C)   Ht 6\' 1"  (1.854 m)   Wt 156 lb 9.6 oz (71 kg)   SpO2 97%   BMI 20.66 kg/m   Physical Exam Vitals and nursing note reviewed. Exam conducted with a chaperone present.  Constitutional:      General: He is not in acute distress.    Appearance: Normal appearance. He is normal weight.  Pulmonary:     Effort: No  respiratory distress.     Breath sounds: No stridor.  Abdominal:     General: Abdomen is flat. There is no distension.     Palpations: Abdomen is soft. There is no mass.     Tenderness: There is no abdominal tenderness. There is no guarding or rebound.  Musculoskeletal:        General: Normal range of motion.     Cervical back: Normal range of motion and neck supple. No rigidity or tenderness.  Lymphadenopathy:     Cervical: No cervical adenopathy.  Skin:    General: Skin is warm and dry.     Capillary Refill: Capillary refill takes less than 2 seconds.  Neurological:     General: No focal deficit present.     Mental Status: He is alert.  Psychiatric:        Mood and Affect: Mood normal.         Behavior: Behavior normal.        Thought Content: Thought content normal.        Judgment: Judgment normal.       Assessment/Plan: 40 year old male with a history of right colon cancer status postchemotherapy and right colectomy.  He is doing very well no evidence of complications or recurrence.  Colonoscopy did not showing any new masses.  No surgical issues at this time we will see him in apparent basis   Greater than 50% of the 20 minutes  visit was spent in counseling/coordination of care   Caroleen Hamman, MD [place] [facility]

## 2020-09-14 ENCOUNTER — Other Ambulatory Visit: Payer: Self-pay

## 2020-09-14 ENCOUNTER — Ambulatory Visit
Admission: EM | Admit: 2020-09-14 | Discharge: 2020-09-14 | Disposition: A | Discharge status: Home / Self Care | Payer: BC Managed Care – PPO | Attending: Emergency Medicine | Admitting: Emergency Medicine

## 2020-09-14 DIAGNOSIS — J069 Acute upper respiratory infection, unspecified: Secondary | ICD-10-CM

## 2020-09-14 DIAGNOSIS — J209 Acute bronchitis, unspecified: Secondary | ICD-10-CM

## 2020-09-14 MED ORDER — BENZONATATE 100 MG PO CAPS: 200.0000 mg | ORAL_CAPSULE | Freq: Three times a day (TID) | ORAL | 0 refills | Status: DC

## 2020-09-14 MED ORDER — PROMETHAZINE-DM 6.25-15 MG/5ML PO SYRP: 5.0000 mL | ORAL_SOLUTION | Freq: Four times a day (QID) | ORAL | 0 refills | Status: DC | PRN

## 2020-09-14 MED ORDER — DOXYCYCLINE HYCLATE 100 MG PO CAPS: 100.0000 mg | ORAL_CAPSULE | Freq: Two times a day (BID) | ORAL | 0 refills | Status: DC

## 2020-09-14 NOTE — Discharge Instructions (Addendum)
Take the Doxycycline twice daily with food for 10 days for your bronchitis.   Use the Tessalon Perles every 8 hours during the day.  Take them with a small sip of water.  They may give you some numbness to the base of your tongue or a metallic taste in your mouth, this is normal.  Use the Promethazine DM cough syrup at bedtime for cough and congestion.  It will make you drowsy so do not take it during the day.  Return for reevaluation or see your primary care provider for any new or worsening symptoms.

## 2020-09-14 NOTE — ED Provider Notes (Signed)
[facility]    CSN: [id number] Arrival date & time: 09/14/20  1541      History   Chief Complaint Chief Complaint  Patient presents with   Nasal Congestion    HPI Tristan [name] is a 40 y.o. male.   HPI  40 year old male here for evaluation of sinus symptoms.  .  Patient reports that he developed nasal congestion, sinus pain and pressure, and a nonproductive cough for the last 10 days.  He states that he gets green discharge out of his nose and his ears pop when he does blow his nose.  He denies fever, shortness of breath or wheezing.  Patient is a smoker  Past Medical History:  Diagnosis Date   Abdominal pain    Anemia    Asthma    Colon cancer ([place])    Family history of breast cancer    Family history of colon cancer    GERD (gastroesophageal reflux disease)    Nausea    Poor appetite    Weight loss     Patient Active Problem List   Diagnosis Date Noted   Personal history of colon cancer 04/18/2020   Family history of breast cancer    Family history of colon cancer    Goals of care, counseling/discussion 02/10/2019   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[place]) 02/10/2019   Iron deficiency anemia 02/10/2019   Protein-calorie malnutrition, severe 02/01/2019   Mass of colon 01/30/2019   Genital warts 07/15/2015    Past Surgical History:  Procedure Laterality Date   COLONOSCOPY N/A 01/30/2019   Procedure: COLONOSCOPY;  Surgeon: Jules Husbands, MD;  Location: [facility] ORS;  Service: General;  Laterality: N/A;   COLONOSCOPY WITH PROPOFOL N/A 08/16/2016   Procedure: COLONOSCOPY WITH PROPOFOL;  Surgeon: [name], MD;  Location: [facility] ENDOSCOPY;  Service: Endoscopy;  Laterality: N/A;   COLONOSCOPY WITH PROPOFOL N/A 01/30/2019   Procedure: COLONOSCOPY WITH PROPOFOL;  Surgeon: [name], MD;  Location: [facility] ENDOSCOPY;  Service: Gastroenterology;  Laterality: N/A;  TRAVEL CASE TO O.R. - PROCEDURE TO START AT 7:30 AM IN O.R.   COLONOSCOPY WITH PROPOFOL N/A  05/03/2020   Procedure: COLONOSCOPY WITH PROPOFOL;  Surgeon: [name], MD;  Location: [facility] END[facility];  Service: Gastroenterology;  Laterality: N/A;   LAPAROSCOPIC RIGHT COLECTOMY N/A 01/30/2019   Procedure: LAPAROSCOPIC HAND ASSISTED RIGHT COLECTOMY CONVERTED TO OPEN PROCEDURE;  Surgeon: Jules Husbands, MD;  Location: [facility] ORS;  Service: General;  Laterality: N/A;   NO PAST SURGERIES     PORTACATH PLACEMENT Right 03/04/2019   Procedure: INSERTION PORT-A-CATH;  Surgeon: Jules Husbands, MD;  Location: [facility] ORS;  Service: General;  Laterality: Right;       Home Medications    Prior to Admission medications   Medication Sig Start Date End Date Taking? Authorizing Provider  benzonatate (TESSALON) 100 MG capsule Take 2 capsules (200 mg total) by mouth every 8 (eight) hours. 09/14/20  Yes Margarette Canada, NP  doxycycline (VIBRAMYCIN) 100 MG capsule Take 1 capsule (100 mg total) by mouth 2 (two) times daily. 09/14/20  Yes Margarette Canada, NP  promethazine-dextromethorphan (PROMETHAZINE-DM) 6.25-15 MG/5ML syrup Take 5 mLs by mouth 4 (four) times daily as needed. 09/14/20  Yes Margarette Canada, NP  sertraline (ZOLOFT) 25 MG tablet TAKE 1 TABLET (25 MG TOTAL) BY MOUTH DAILY. 08/23/20  Yes Sindy Guadeloupe, MD    Family History Family History  Problem Relation Age of Onset   Diabetes Mother 67  Type 1   Breast cancer Mother 88   Healthy Father    Colon polyps Father    Ulcerative colitis Paternal Aunt    Multiple sclerosis Paternal Aunt    Dementia Paternal Uncle    Breast cancer Maternal Grandmother 97   Breast cancer Paternal Grandmother 73   Rectal cancer Maternal Great-grandmother    Colon cancer Cousin     Social History Social History   Tobacco Use   Smoking status: Every Day    Packs/day: 1.00    Pack years: 0.00    Types: Cigarettes   Smokeless tobacco: Never  Vaping Use   Vaping Use: Never used  Substance Use Topics   Alcohol use: Yes    Comment: maybe 2-3 beers a month   Drug  use: Yes    Types: Marijuana     Allergies   Patient has no known allergies.   Review of Systems Review of Systems  Constitutional:  Negative for activity change, appetite change and fever.  HENT:  Positive for congestion, rhinorrhea, sinus pressure and sinus pain. Negative for ear pain.   Respiratory:  Positive for cough. Negative for shortness of breath and wheezing.   Hematological: Negative.   Psychiatric/Behavioral: Negative.      Physical Exam Triage Vital Signs ED Triage Vitals  Enc Vitals Group     BP 09/14/20 1640 111/74     Pulse Rate 09/14/20 1640 76     Resp 09/14/20 1640 17     Temp 09/14/20 1640 98.5 F (36.9 C)     Temp Source 09/14/20 1640 Oral     SpO2 09/14/20 1640 98 %     Weight 09/14/20 1639 155 lb (70.3 kg)     Height 09/14/20 1639 6\' 1"  (1.854 m)     Head Circumference --      Peak Flow --      Pain Score 09/14/20 1639 0     Pain Loc --      Pain Edu? --      Excl. in [place]? --    No data found.  Updated Vital Signs BP 111/74 (BP Location: Right Arm)   Pulse 76   Temp 98.5 F (36.9 C) (Oral)   Resp 17   Ht 6\' 1"  (1.854 m)   Wt 155 lb (70.3 kg)   SpO2 98%   BMI 20.45 kg/m   Visual Acuity Right Eye Distance:   Left Eye Distance:   Bilateral Distance:    Right Eye Near:   Left Eye Near:    Bilateral Near:     Physical Exam Vitals and nursing note reviewed.  Constitutional:      General: He is not in acute distress.    Appearance: Normal appearance. He is normal weight. He is not ill-appearing.  HENT:     Head: Normocephalic and atraumatic.     Right Ear: Tympanic membrane, ear canal and external ear normal. There is no impacted cerumen.     Left Ear: Tympanic membrane, ear canal and external ear normal. There is no impacted cerumen.     Nose: Congestion and rhinorrhea present.     Mouth/Throat:     Mouth: Mucous membranes are moist.     Pharynx: No posterior oropharyngeal erythema.  Cardiovascular:     Rate and Rhythm: Normal  rate and regular rhythm.     Pulses: Normal pulses.     Heart sounds: Normal heart sounds. No murmur heard.   No gallop.  Pulmonary:  Effort: Pulmonary effort is normal.     Breath sounds: Rhonchi present. No wheezing or rales.  Musculoskeletal:     Cervical back: Normal range of motion and neck supple.  Lymphadenopathy:     Cervical: No cervical adenopathy.  Skin:    General: Skin is warm and dry.     Capillary Refill: Capillary refill takes less than 2 seconds.     Findings: No erythema or rash.  Neurological:     General: No focal deficit present.     Mental Status: He is alert and oriented to person, place, and time.  Psychiatric:        Mood and Affect: Mood normal.        Behavior: Behavior normal.        Thought Content: Thought content normal.        Judgment: Judgment normal.     UC Treatments / Results  Labs (all labs ordered are listed, but only abnormal results are displayed) Labs Reviewed - No data to display  EKG   Radiology No results found.  Procedures Procedures (including critical care time)  Medications Ordered in UC Medications - No data to display  Initial Impression / Assessment and Plan / UC Course  I have reviewed the triage vital signs and the nursing notes.  Pertinent labs & imaging results that were available during my care of the patient were reviewed by me and considered in my medical decision making (see chart for details).  Patient is a nontoxic-appearing 40 year old male here for evaluation of respiratory complaints as outlined in HPI above.  Patient's physical exam reveals pearly gray tympanic membranes bilaterally with a normal light reflex and clear external auditory canals.  Nasal mucosa is mildly erythematous and edematous with yellow discharge in both naris.  Oropharyngeal exam is benign.  No cervical lymphadenopathy appreciable exam.  Cardiopulmonary exam reveals rhonchi in the upper middle lobes bilaterally.  No appreciable  wheezes.  Patient exam is consistent with bronchitis and a URI.  Will treat patient with twice daily for 10 days for treatment of his URI and will give Tessalon Perles and Promethazine DM cough syrup to help with cough and congestion.   Final Clinical Impressions(s) / UC Diagnoses   Final diagnoses:  Upper respiratory tract infection, unspecified type  Acute bronchitis, unspecified organism     Discharge Instructions      Take the Doxycycline twice daily with food for 10 days for your bronchitis.   Use the Tessalon Perles every 8 hours during the day.  Take them with a small sip of water.  They may give you some numbness to the base of your tongue or a metallic taste in your mouth, this is normal.  Use the Promethazine DM cough syrup at bedtime for cough and congestion.  It will make you drowsy so do not take it during the day.  Return for reevaluation or see your primary care provider for any new or worsening symptoms.      ED Prescriptions     Medication Sig Dispense Auth. Provider   doxycycline (VIBRAMYCIN) 100 MG capsule Take 1 capsule (100 mg total) by mouth 2 (two) times daily. 20 capsule Margarette Canada, NP   benzonatate (TESSALON) 100 MG capsule Take 2 capsules (200 mg total) by mouth every 8 (eight) hours. 21 capsule Margarette Canada, NP   promethazine-dextromethorphan (PROMETHAZINE-DM) 6.25-15 MG/5ML syrup Take 5 mLs by mouth 4 (four) times daily as needed. 118 mL Margarette Canada, NP  PDMP not reviewed this encounter.   Margarette Canada, NP 09/14/20 1654

## 2020-09-14 NOTE — ED Triage Notes (Signed)
Patient complains of sinus pain and pressure with nasal congestion, chest congestion, cough x 10 days. States that he is concerned that he has bronchitis.

## 2020-09-15 ENCOUNTER — Encounter: Payer: Self-pay | Admitting: Oncology

## 2020-09-19 ENCOUNTER — Ambulatory Visit: Payer: BC Managed Care – PPO

## 2020-09-21 ENCOUNTER — Ambulatory Visit: Payer: Self-pay | Admitting: Family Medicine

## 2020-09-23 ENCOUNTER — Ambulatory Visit: Admission: RE | Admit: 2020-09-23 | Payer: BC Managed Care – PPO | Source: Ambulatory Visit

## 2020-09-26 ENCOUNTER — Inpatient Hospital Stay: Payer: BC Managed Care – PPO | Attending: Oncology

## 2020-09-26 ENCOUNTER — Inpatient Hospital Stay: Payer: BC Managed Care – PPO | Admitting: Oncology

## 2020-11-27 ENCOUNTER — Encounter: Payer: Self-pay | Admitting: Oncology

## 2020-11-28 ENCOUNTER — Ambulatory Visit (INDEPENDENT_AMBULATORY_CARE_PROVIDER_SITE_OTHER): Payer: BC Managed Care – PPO | Admitting: Family Medicine

## 2020-11-28 ENCOUNTER — Encounter: Payer: Self-pay | Admitting: Family Medicine

## 2020-11-28 ENCOUNTER — Other Ambulatory Visit: Payer: Self-pay

## 2020-11-28 VITALS — BP 118/66 | HR 75 | Ht 73.0 in | Wt 156.2 lb

## 2020-11-28 DIAGNOSIS — F411 Generalized anxiety disorder: Secondary | ICD-10-CM

## 2020-11-28 DIAGNOSIS — R21 Rash and other nonspecific skin eruption: Secondary | ICD-10-CM

## 2020-11-28 DIAGNOSIS — Z85038 Personal history of other malignant neoplasm of large intestine: Secondary | ICD-10-CM

## 2020-11-28 DIAGNOSIS — C188 Malignant neoplasm of overlapping sites of colon: Secondary | ICD-10-CM | POA: Diagnosis not present

## 2020-11-28 MED ORDER — BUSPIRONE HCL 10 MG PO TABS: 10.0000 mg | ORAL_TABLET | Freq: Two times a day (BID) | ORAL | 2 refills | Status: DC | PRN

## 2020-11-28 MED ORDER — TRIAMCINOLONE ACETONIDE 0.5 % EX CREA: 1.0000 "application " | TOPICAL_CREAM | Freq: Two times a day (BID) | CUTANEOUS | 3 refills | Status: DC

## 2020-11-28 MED ORDER — HYDROXYZINE HCL 10 MG PO TABS: 10.0000 mg | ORAL_TABLET | Freq: Three times a day (TID) | ORAL | 2 refills | Status: DC | PRN

## 2020-11-28 NOTE — Progress Notes (Signed)
Subjective:    Patient ID: Tristan [name], male    DOB: 04/28/1980, 40 y.o.   MRN: [id number]  Tristan [name] is a 40 y.o. male presenting on 11/28/2020 for Establish Care  No prior PCP  HPI  Anxiety Previously treated by Oncology with SSRI Sertraline 25mg  daily with short term course, he is unaware how long he took the med for. They did not increase his dosage. He came off med eventually since it did not work.  He says he has issue with episodic urge to have anxiety and some panic attack symptoms, deep breathing.  He smokes weed to help manage his anxiety. For years.   He admits some cystic bumps that can occur on arms, clear liquid.  History of Colon Cancer Dx and treated in 2020, has had colonoscopy, removal, and in 2021 he completed chemotherapy. He has completed repeat screening colonoscopy, CT  He has 2 small children  Tobacco Abuse Active smoking.  Genital Warts HPV He has tried cryotherapy at [facility]. Some partial success in past, still has residual warts may need refer to Derm in future.   Depression screen [facility] 2/9 11/28/2020  Decreased Interest 0  Down, Depressed, Hopeless 0  PHQ - 2 Score 0    GAD 7 : Generalized Anxiety Score 11/28/2020  Nervous, Anxious, on Edge 2  Control/stop worrying 1  Worry too much - different things 1  Trouble relaxing 1  Restless 1  Easily annoyed or irritable 1  Afraid - awful might happen 0  Total GAD 7 Score 7  Anxiety Difficulty Somewhat difficult      Past Medical History:  Diagnosis Date   Abdominal pain    Anemia    Asthma    Colon cancer ([place])    Family history of breast cancer    Family history of colon cancer    GERD (gastroesophageal reflux disease)    Nausea    Poor appetite    Weight loss    Past Surgical History:  Procedure Laterality Date   COLONOSCOPY N/A 01/30/2019   Procedure: COLONOSCOPY;  Surgeon: Jules Husbands, MD;  Location: [facility] ORS;  Service: General;  Laterality: N/A;    COLONOSCOPY WITH PROPOFOL N/A 08/16/2016   Procedure: COLONOSCOPY WITH PROPOFOL;  Surgeon: [name], MD;  Location: [facility] ENDOSCOPY;  Service: Endoscopy;  Laterality: N/A;   COLONOSCOPY WITH PROPOFOL N/A 01/30/2019   Procedure: COLONOSCOPY WITH PROPOFOL;  Surgeon: [name], MD;  Location: [facility] ENDOSCOPY;  Service: Gastroenterology;  Laterality: N/A;  TRAVEL CASE TO O.R. - PROCEDURE TO START AT 7:30 AM IN O.R.   COLONOSCOPY WITH PROPOFOL N/A 05/03/2020   Procedure: COLONOSCOPY WITH PROPOFOL;  Surgeon: [name], MD;  Location: [facility] ENDOSCOPY;  Service: Gastroenterology;  Laterality: N/A;   LAPAROSCOPIC RIGHT COLECTOMY N/A 01/30/2019   Procedure: LAPAROSCOPIC HAND ASSISTED RIGHT COLECTOMY CONVERTED TO OPEN PROCEDURE;  Surgeon: Jules Husbands, MD;  Location: [facility] ORS;  Service: General;  Laterality: N/A;   NO PAST SURGERIES     PORTACATH PLACEMENT Right 03/04/2019   Procedure: INSERTION PORT-A-CATH;  Surgeon: Jules Husbands, MD;  Location: [facility] ORS;  Service: General;  Laterality: Right;   Social History   Socioeconomic History   Marital status: Single    Spouse name: Not on file   Number of children: Not on file   Years of education: Not on file   Highest education level: Not on file  Occupational History   Not on file  Tobacco Use  Smoking status: Every Day    Packs/day: 0.50    Years: 20.00    Pack years: 10.00    Types: Cigarettes   Smokeless tobacco: Never  Vaping Use   Vaping Use: Never used  Substance and Sexual Activity   Alcohol use: Yes    Alcohol/week: 3.0 standard drinks    Types: 3 Standard drinks or equivalent per week    Comment: maybe 2-3 beers a month   Drug use: Yes    Types: Marijuana   Sexual activity: Yes    Birth control/protection: Condom  Other Topics Concern   Not on file  Social History Narrative   Not on file   Social Determinants of Health   Financial Resource Strain: Not on file  Food Insecurity: Not on file  Transportation Needs: Not on  file  Physical Activity: Not on file  Stress: Not on file  Social Connections: Not on file  Intimate Partner Violence: Not on file   Family History  Problem Relation Age of Onset   Diabetes Mother 16       Type 1   Breast cancer Mother 60   Healthy Father    Colon polyps Father    Ulcerative colitis Paternal Aunt    Multiple sclerosis Paternal Aunt    Dementia Paternal Uncle    Breast cancer Maternal Grandmother 85   Breast cancer Paternal Grandmother 85   Rectal cancer Maternal Great-grandmother    Colon cancer Cousin    No current outpatient medications on file prior to visit.   Current Facility-Administered Medications on File Prior to Visit  Medication   sodium chloride flush (NS) 0.9 % injection 10 mL    Review of Systems Per HPI unless specifically indicated above      Objective:    BP 118/66   Pulse 75   Ht 6\' 1"  (1.854 m)   Wt 156 lb 3.2 oz (70.9 kg)   SpO2 99%   BMI 20.61 kg/m   Wt Readings from Last 3 Encounters:  11/28/20 156 lb 3.2 oz (70.9 kg)  09/14/20 155 lb (70.3 kg)  09/05/20 156 lb 9.6 oz (71 kg)    Physical Exam Vitals and nursing note reviewed.  Constitutional:      General: He is not in acute distress.    Appearance: Normal appearance. He is well-developed. He is not diaphoretic.     Comments: Well-appearing, comfortable, cooperative  HENT:     Head: Normocephalic and atraumatic.  Eyes:     General:        Right eye: No discharge.        Left eye: No discharge.     Conjunctiva/sclera: Conjunctivae normal.  Cardiovascular:     Rate and Rhythm: Normal rate.  Pulmonary:     Effort: Pulmonary effort is normal.  Skin:    General: Skin is warm and dry.     Findings: Rash present. No erythema.  Neurological:     Mental Status: He is alert and oriented to person, place, and time.  Psychiatric:        Mood and Affect: Mood normal.        Behavior: Behavior normal.        Thought Content: Thought content normal.     Comments: Well  groomed, good eye contact, normal speech and thoughts    Results for orders placed or performed in visit on 06/14/20  CEA  Result Value Ref Range   CEA 3.2 0.0 - 4.7 ng/mL  Comprehensive metabolic panel  Result Value Ref Range   Sodium 138 135 - 145 mmol/L   Potassium 4.1 3.5 - 5.1 mmol/L   Chloride 100 98 - 111 mmol/L   CO2 29 22 - 32 mmol/L   Glucose, Bld 107 (H) 70 - 99 mg/dL   BUN 11 6 - 20 mg/dL   Creatinine, Ser 1.04 0.61 - 1.24 mg/dL   Calcium 9.2 8.9 - 10.3 mg/dL   Total Protein 6.9 6.5 - 8.1 g/dL   Albumin 4.1 3.5 - 5.0 g/dL   AST 24 15 - 41 U/L   ALT 17 0 - 44 U/L   Alkaline Phosphatase 73 38 - 126 U/L   Total Bilirubin 0.6 0.3 - 1.2 mg/dL   GFR, Estimated >60 >60 mL/min   Anion gap 9 5 - 15  CBC with Differential  Result Value Ref Range   WBC 5.5 4.0 - 10.5 K/uL   RBC 4.42 4.22 - 5.81 MIL/uL   Hemoglobin 12.9 (L) 13.0 - 17.0 g/dL   HCT 39.2 39.0 - 52.0 %   MCV 88.7 80.0 - 100.0 fL   MCH 29.2 26.0 - 34.0 pg   MCHC 32.9 30.0 - 36.0 g/dL   RDW 14.6 11.5 - 15.5 %   Platelets 173 150 - 400 K/uL   nRBC 0.0 0.0 - 0.2 %   Neutrophils Relative % 74 %   Neutro Abs 4.1 1.7 - 7.7 K/uL   Lymphocytes Relative 13 %   Lymphs Abs 0.7 0.7 - 4.0 K/uL   Monocytes Relative 10 %   Monocytes Absolute 0.6 0.1 - 1.0 K/uL   Eosinophils Relative 2 %   Eosinophils Absolute 0.1 0.0 - 0.5 K/uL   Basophils Relative 1 %   Basophils Absolute 0.0 0.0 - 0.1 K/uL   Immature Granulocytes 0 %   Abs Immature Granulocytes 0.02 0.00 - 0.07 K/uL      Assessment & Plan:   Problem List Items Addressed This Visit     Personal history of colon cancer   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[place])   Other Visit Diagnoses     GAD (generalized anxiety disorder)    -  Primary   Relevant Medications   busPIRone (BUSPAR) 10 MG tablet   hydrOXYzine (ATARAX/VISTARIL) 10 MG tablet   History of colon cancer       Skin rash       Relevant Medications   hydrOXYzine (ATARAX/VISTARIL) 10 MG tablet    triamcinolone cream (KENALOG) 0.5 %       Followed by Oncology / GI for colon cancer history.  Generalized Anxiety Disorder GAD, w/ panic history  Previous failed SSRI low dose Sertraline 25mg , off med now  Will trial Buspar 10mg  1-2 times daily regularly for now and can phase in and out if need more regularly or PRN  Add Hydroxyzine for PRN use for anxiety panic and for itching / rash.  Topical triamcinolone for rash as well  PRN reduce itch urge  Meds ordered this encounter  Medications   busPIRone (BUSPAR) 10 MG tablet    Sig: Take 1 tablet (10 mg total) by mouth 2 (two) times daily as needed (anxiety).    Dispense:  60 tablet    Refill:  2   hydrOXYzine (ATARAX/VISTARIL) 10 MG tablet    Sig: Take 1-2 tablets (10-20 mg total) by mouth 3 (three) times daily as needed.    Dispense:  60 tablet    Refill:  2   triamcinolone  cream (KENALOG) 0.5 %    Sig: Apply 1 application topically 2 (two) times daily. To affected areas, for up to 2 weeks. As needed    Dispense:  30 g    Refill:  3       Follow up plan: Return in about 6 weeks (around 01/09/2021) for 6 week anxiety, rash/itching follow-up.  Nobie Putnam, [facility] 11/28/2020, 10:30 AM

## 2020-11-28 NOTE — Patient Instructions (Addendum)
Thank you for coming to the office today.  Start medications as prescribed.  Use the topical triamcinolone as needed for spot treatment for itching / rash inflammation  Use the Buspar anxiety pill once a day for now then up to twice a day gradually, can use it regularly, or phase in and out of it.  Use the Hydroxyzine for calming effect for anxiety if need in a pinch or can use more regularly for itching rash. Can take up to 2 at a time.  Please schedule a Follow-up Appointment to: Return in about 6 weeks (around 01/09/2021) for 6 week anxiety, rash/itching follow-up.  If you have any other questions or concerns, please feel free to call the office or send a message through [place]. You may also schedule an earlier appointment if necessary.  Additionally, you may be receiving a survey about your experience at our office within a few days to 1 week by e-mail or mail. We value your feedback.  Nobie Putnam, DO [place]

## 2020-11-30 ENCOUNTER — Encounter: Payer: Self-pay | Admitting: Family Medicine

## 2020-11-30 ENCOUNTER — Ambulatory Visit: Payer: Self-pay | Admitting: Family Medicine

## 2020-11-30 ENCOUNTER — Other Ambulatory Visit: Payer: Self-pay

## 2020-11-30 DIAGNOSIS — B977 Papillomavirus as the cause of diseases classified elsewhere: Secondary | ICD-10-CM

## 2020-11-30 NOTE — Progress Notes (Signed)
Tristan [name] [id number] y.o. comes into clinic today for cryotx.  Denies any problems with previous treatments.  Reports that the HPV  has improved and returned   since the last treatment.   Desires to proceed with cryotx today.  O:  + HPV   A: 1. 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  No edema, erythema, or ulcerative lesions present today.  P: Cryo treatment in 3 freeze/thaw cycles today.  Patient tolerated procedure well. Reviewed with patient after-care instructions and when to call clinic. No sex until area has completely healed. Rec condoms with all sex RTC in 10-14 days for next treatment if needed.    Junious Dresser, FNP

## 2020-12-07 ENCOUNTER — Ambulatory Visit (LOCAL_COMMUNITY_HEALTH_CENTER): Payer: BC Managed Care – PPO

## 2020-12-07 ENCOUNTER — Other Ambulatory Visit: Payer: Self-pay

## 2020-12-07 DIAGNOSIS — Z23 Encounter for immunization: Secondary | ICD-10-CM

## 2020-12-07 NOTE — Progress Notes (Signed)
In Nurse Clinic, requests HPV vaccine. No prior hx of HPV vaccine. HPV given today and tolerated well. Updated [facility] copy given and recommended schedule explained. Josie Saunders, RN

## 2020-12-20 ENCOUNTER — Other Ambulatory Visit: Payer: Self-pay | Admitting: Family Medicine

## 2020-12-20 DIAGNOSIS — F411 Generalized anxiety disorder: Secondary | ICD-10-CM

## 2020-12-20 NOTE — Telephone Encounter (Signed)
Requested Prescriptions  Pending Prescriptions Disp Refills  . busPIRone (BUSPAR) 10 MG tablet [Pharmacy Med Name: BUSPIRONE HCL 10 MG TABLET] 180 tablet 1    Sig: TAKE 1 TABLET (10 MG TOTAL) BY MOUTH 2 (TWO) TIMES DAILY AS NEEDED (ANXIETY).     Psychiatry: Anxiolytics/Hypnotics - Non-controlled Passed - 12/20/2020 12:32 PM      Passed - Valid encounter within last 6 months    Recent Outpatient Visits          3 weeks ago GAD (generalized anxiety disorder)   [facility] Parks [place], Devonne Doughty, DO      Future Appointments            In 3 weeks [place], Devonne Doughty, DO [facility], [facility]

## 2020-12-27 ENCOUNTER — Encounter: Payer: Self-pay | Admitting: Oncology

## 2021-01-10 ENCOUNTER — Ambulatory Visit (INDEPENDENT_AMBULATORY_CARE_PROVIDER_SITE_OTHER): Payer: BC Managed Care – PPO | Admitting: Family Medicine

## 2021-01-10 ENCOUNTER — Encounter: Payer: Self-pay | Admitting: Family Medicine

## 2021-01-10 ENCOUNTER — Other Ambulatory Visit: Payer: Self-pay

## 2021-01-10 DIAGNOSIS — F411 Generalized anxiety disorder: Secondary | ICD-10-CM | POA: Diagnosis not present

## 2021-01-10 NOTE — Progress Notes (Signed)
Subjective:    Patient ID: Tristan [name], male    DOB: 12/05/1980, 40 y.o.   MRN: [id number]  Tristan [name] is a 40 y.o. male presenting on 01/10/2021 for Anxiety   HPI  Anxiety Previously treated by Oncology with SSRI Sertraline 25mg  daily with short term course, he is unaware how long he took the med for. They did not increase his dosage. He came off med eventually since it did not work.   He says he has issue with episodic urge to have anxiety and some panic attack symptoms, deep breathing.  He smokes weed to help manage his anxiety. For years.  Last visit he was given rx Buspar 10mg  PRN, he tried taking this and it helps and works but only lasts 4 hours approx. Asks about other doses.   Onset with anxiety he started with rash. He admits some cystic bumps that can occur on arms, clear liquid. He was followed by Dermatologist, thought allergic reaction, negative testing. They tried rx management, no relief. He has tried multiple creams. Still has but less often.   History of Colon Cancer Dx and treated in 2020, has had colonoscopy, removal, and in 2021 he completed chemotherapy. He has completed repeat screening colonoscopy, CT   Depression screen [facility] 2/9 11/28/2020  Decreased Interest 0  Down, Depressed, Hopeless 0  PHQ - 2 Score 0   GAD 7 : Generalized Anxiety Score 11/28/2020  Nervous, Anxious, on Edge 2  Control/stop worrying 1  Worry too much - different things 1  Trouble relaxing 1  Restless 1  Easily annoyed or irritable 1  Afraid - awful might happen 0  Total GAD 7 Score 7  Anxiety Difficulty Somewhat difficult      Social History   Tobacco Use   Smoking status: Every Day    Packs/day: 0.50    Years: 20.00    Pack years: 10.00    Types: Cigarettes   Smokeless tobacco: Never  Vaping Use   Vaping Use: Never used  Substance Use Topics   Alcohol use: Yes    Alcohol/week: 3.0 standard drinks    Types: 3 Standard drinks or equivalent per week     Comment: maybe 2-3 beers a month   Drug use: Yes    Types: Marijuana    Review of Systems Per HPI unless specifically indicated above     Objective:    BP (!) 109/49   Pulse 72   Ht 6\' 1"  (1.854 m)   Wt 164 lb (74.4 kg)   SpO2 99%   BMI 21.64 kg/m   Wt Readings from Last 3 Encounters:  01/10/21 164 lb (74.4 kg)  11/28/20 156 lb 3.2 oz (70.9 kg)  09/14/20 155 lb (70.3 kg)    Physical Exam Vitals and nursing note reviewed.  Constitutional:      General: He is not in acute distress.    Appearance: Normal appearance. He is well-developed. He is not diaphoretic.     Comments: Well-appearing, comfortable, cooperative  HENT:     Head: Normocephalic and atraumatic.  Eyes:     General:        Right eye: No discharge.        Left eye: No discharge.     Conjunctiva/sclera: Conjunctivae normal.  Cardiovascular:     Rate and Rhythm: Normal rate.  Pulmonary:     Effort: Pulmonary effort is normal.  Skin:    General: Skin is warm and dry.  Findings: No erythema or rash.  Neurological:     Mental Status: He is alert and oriented to person, place, and time.  Psychiatric:        Mood and Affect: Mood normal.        Behavior: Behavior normal.        Thought Content: Thought content normal.     Comments: Well groomed, good eye contact, normal speech and thoughts     Results for orders placed or performed in visit on 06/14/20  CEA  Result Value Ref Range   CEA 3.2 0.0 - 4.7 ng/mL  Comprehensive metabolic panel  Result Value Ref Range   Sodium 138 135 - 145 mmol/L   Potassium 4.1 3.5 - 5.1 mmol/L   Chloride 100 98 - 111 mmol/L   CO2 29 22 - 32 mmol/L   Glucose, Bld 107 (H) 70 - 99 mg/dL   BUN 11 6 - 20 mg/dL   Creatinine, Ser 1.04 0.61 - 1.24 mg/dL   Calcium 9.2 8.9 - 10.3 mg/dL   Total Protein 6.9 6.5 - 8.1 g/dL   Albumin 4.1 3.5 - 5.0 g/dL   AST 24 15 - 41 U/L   ALT 17 0 - 44 U/L   Alkaline Phosphatase 73 38 - 126 U/L   Total Bilirubin 0.6 0.3 - 1.2 mg/dL    GFR, Estimated >60 >60 mL/min   Anion gap 9 5 - 15  CBC with Differential  Result Value Ref Range   WBC 5.5 4.0 - 10.5 K/uL   RBC 4.42 4.22 - 5.81 MIL/uL   Hemoglobin 12.9 (L) 13.0 - 17.0 g/dL   HCT 39.2 39.0 - 52.0 %   MCV 88.7 80.0 - 100.0 fL   MCH 29.2 26.0 - 34.0 pg   MCHC 32.9 30.0 - 36.0 g/dL   RDW 14.6 11.5 - 15.5 %   Platelets 173 150 - 400 K/uL   nRBC 0.0 0.0 - 0.2 %   Neutrophils Relative % 74 %   Neutro Abs 4.1 1.7 - 7.7 K/uL   Lymphocytes Relative 13 %   Lymphs Abs 0.7 0.7 - 4.0 K/uL   Monocytes Relative 10 %   Monocytes Absolute 0.6 0.1 - 1.0 K/uL   Eosinophils Relative 2 %   Eosinophils Absolute 0.1 0.0 - 0.5 K/uL   Basophils Relative 1 %   Basophils Absolute 0.0 0.0 - 0.1 K/uL   Immature Granulocytes 0 %   Abs Immature Granulocytes 0.02 0.00 - 0.07 K/uL      Assessment & Plan:   Problem List Items Addressed This Visit   None Visit Diagnoses     GAD (generalized anxiety disorder)       Relevant Medications   busPIRone (BUSPAR) 10 MG tablet       GAD Improved on Buspar Failed Sertraline low dose. He declines other SSRI, lexapro due to risk of libido/ED side effect Counseled on other option SNRI consider Effexor for limited ED side effect in future. For now we will inc dosing frequency of Buspar to 10mg  TID can consider adjust further if need May take Hydroxyzine PRN  Likely triggered by stress/anxiety - Rash appears improved, can be assoc with urticaria, possible allergy cause, he will try limiting gluten in diet and also consider other dermatologist in future.  No orders of the defined types were placed in this encounter.     Follow up plan: Return in about 3 months (around 04/12/2021) for 3 month follow-up anxiety.   Nobie Putnam, DO  [facility] 01/10/2021, 9:37 AM

## 2021-01-10 NOTE — Patient Instructions (Addendum)
Thank you for coming to the office today.  Dose adjust with Buspar now up to 3 times a day, 10mg  dose 3 times a day or every 4-8 hours.  Try avoiding gluten to see if this is causing the rash and symptoms.  Please schedule a Follow-up Appointment to: Return in about 3 months (around 04/12/2021) for 3 month follow-up anxiety.  If you have any other questions or concerns, please feel free to call the office or send a message through [place]. You may also schedule an earlier appointment if necessary.  Additionally, you may be receiving a survey about your experience at our office within a few days to 1 week by e-mail or mail. We value your feedback.  Nobie Putnam, DO [place]

## 2021-01-12 ENCOUNTER — Encounter: Payer: Self-pay | Admitting: Family Medicine

## 2021-01-12 ENCOUNTER — Ambulatory Visit (INDEPENDENT_AMBULATORY_CARE_PROVIDER_SITE_OTHER): Payer: BC Managed Care – PPO | Admitting: Family Medicine

## 2021-01-12 ENCOUNTER — Ambulatory Visit (LOCAL_COMMUNITY_HEALTH_CENTER): Payer: BC Managed Care – PPO

## 2021-01-12 ENCOUNTER — Other Ambulatory Visit: Payer: Self-pay

## 2021-01-12 VITALS — BP 124/63 | HR 87 | Ht 73.0 in | Wt 166.2 lb

## 2021-01-12 DIAGNOSIS — R52 Pain, unspecified: Secondary | ICD-10-CM | POA: Diagnosis not present

## 2021-01-12 DIAGNOSIS — J0110 Acute frontal sinusitis, unspecified: Secondary | ICD-10-CM | POA: Diagnosis not present

## 2021-01-12 DIAGNOSIS — Z7185 Encounter for immunization safety counseling: Secondary | ICD-10-CM

## 2021-01-12 LAB — POCT INFLUENZA A/B
Influenza A, POC: NEGATIVE
Influenza B, POC: NEGATIVE

## 2021-01-12 MED ORDER — AMOXICILLIN 875 MG PO TABS: 875.0000 mg | ORAL_TABLET | Freq: Two times a day (BID) | ORAL | 0 refills | Status: AC

## 2021-01-12 NOTE — Progress Notes (Signed)
In Nurse Clinic for HPV vaccine. Vaccine not given today as pt reports fever, body aches, cold symptoms. Pt agrees to schedule immunization when feeling better. Pt has dr appt this am to evaluate current symptoms. Josie Saunders, RN

## 2021-01-12 NOTE — Progress Notes (Signed)
Subjective:    Patient ID: Tristan [name], male    DOB: Jun 21, 1980, 40 y.o.   MRN: [id number]  Tristan [name] is a 40 y.o. male presenting on 01/12/2021 for Nasal Congestion and Cough   HPI  Sinusitis Congestion / Sinus Pressure Headache Reports acute onset 2 days ago with congestion and headache, seems fatigued as well, ears popping. He has tried decongestants PRN In past used antibiotic with relief. No sick contact Denies fever chills Admits some body aches Denies nausea vomiting abdominal pain  Depression screen PHQ 2/9 11/28/2020  Decreased Interest 0  Down, Depressed, Hopeless 0  PHQ - 2 Score 0    Social History   Tobacco Use   Smoking status: Every Day    Packs/day: 0.50    Years: 20.00    Pack years: 10.00    Types: Cigarettes   Smokeless tobacco: Never  Vaping Use   Vaping Use: Never used  Substance Use Topics   Alcohol use: Yes    Alcohol/week: 3.0 standard drinks    Types: 3 Standard drinks or equivalent per week    Comment: maybe 2-3 beers a month   Drug use: Yes    Types: Marijuana    Review of Systems Per HPI unless specifically indicated above     Objective:    BP 124/63   Pulse 87   Ht 6\' 1"  (1.854 m)   Wt 166 lb 3.2 oz (75.4 kg)   SpO2 97%   BMI 21.93 kg/m   Wt Readings from Last 3 Encounters:  01/12/21 166 lb 3.2 oz (75.4 kg)  01/10/21 164 lb (74.4 kg)  11/28/20 156 lb 3.2 oz (70.9 kg)    Physical Exam Vitals and nursing note reviewed.  Constitutional:      General: He is not in acute distress.    Appearance: He is well-developed. He is not diaphoretic.     Comments: Well-appearing, comfortable, cooperative  HENT:     Head: Normocephalic and atraumatic.  Eyes:     General:        Right eye: No discharge.        Left eye: No discharge.     Conjunctiva/sclera: Conjunctivae normal.  Neck:     Thyroid: No thyromegaly.  Cardiovascular:     Rate and Rhythm: Normal rate and regular rhythm.     Pulses: Normal pulses.     Heart  sounds: Normal heart sounds. No murmur heard. Pulmonary:     Effort: Pulmonary effort is normal. No respiratory distress.     Breath sounds: Normal breath sounds. No wheezing or rales.  Musculoskeletal:        General: Normal range of motion.     Cervical back: Normal range of motion and neck supple.  Lymphadenopathy:     Cervical: No cervical adenopathy.  Skin:    General: Skin is warm and dry.     Findings: No erythema or rash.  Neurological:     Mental Status: He is alert and oriented to person, place, and time. Mental status is at baseline.  Psychiatric:        Behavior: Behavior normal.     Comments: Well groomed, good eye contact, normal speech and thoughts   Results for orders placed or performed in visit on 01/12/21  POCT Influenza A/B  Result Value Ref Range   Influenza A, POC Negative Negative   Influenza B, POC Negative Negative      Assessment & Plan:   Problem List  Items Addressed This Visit   None Visit Diagnoses     Acute non-recurrent frontal sinusitis    -  Primary   Relevant Medications   amoxicillin (AMOXIL) 875 MG tablet   Body aches       Relevant Orders   POCT Influenza A/B (Completed)       Consistent with acute frontal rhinosinusitis, likely initially viral URI allergic rhinitis component with worsening concern for bacterial infection based on prior history with developing sinus infection.  Plan: Rapid flu test today is NEGATIVE  1. Reassurance, likely self-limited - no indication for antibiotics at this time - however if not improving may start Amoxicillin 875 BID x 10 days if not improved or worsening crtieria 2. Use nasal saline or flonase OTC 3. Mucinex decongestant sudafed if need 4. Theraflu OTC options Return criteria reviewed   Meds ordered this encounter  Medications   amoxicillin (AMOXIL) 875 MG tablet    Sig: Take 1 tablet (875 mg total) by mouth 2 (two) times daily for 10 days.    Dispense:  20 tablet    Refill:  0       Follow up plan: Return if symptoms worsen or fail to improve.   Nobie Putnam, [facility] 01/12/2021, 10:02 AM

## 2021-01-12 NOTE — Patient Instructions (Addendum)
Thank you for coming to the office today.  1. It sounds like you have a Sinusitis (Bacterial Infection) - this most likely started as an Upper Respiratory Virus that has settled into an infection. Allergies can also cause this. - Start Amoxicillin 1 pill twice daily (breakfast and dinner, with food and plenty of water) for 10 days, complete entire course, do not stop early even if feeling better - Recommend Mucinex / Sudafed decongestant Recommend to start keep using Nasal Saline spray multiple times a day to help flush out congestion and clear sinuses - Improve hydration by drinking plenty of clear fluids (water, gatorade) to reduce secretions and thin congestion - Congestion draining down throat can cause irritation. May try warm herbal tea with honey, cough drops - Can take Tylenol or Ibuprofen as needed for fevers - May continue over the counter cold medicine as you are, I would not use any decongestant or mucinex longer than 7 days.  If you develop persistent fever >101F for at least 3 consecutive days, headaches with sinus pain or pressure or persistent earache, please schedule a follow-up evaluation within next few days to week.   Please schedule a Follow-up Appointment to: Return if symptoms worsen or fail to improve.  If you have any other questions or concerns, please feel free to call the office or send a message through [place]. You may also schedule an earlier appointment if necessary.  Additionally, you may be receiving a survey about your experience at our office within a few days to 1 week by e-mail or mail. We value your feedback.  Nobie Putnam, DO [place]

## 2021-02-14 ENCOUNTER — Encounter: Payer: Self-pay | Admitting: Internal Medicine

## 2021-02-14 ENCOUNTER — Other Ambulatory Visit: Payer: Self-pay

## 2021-02-14 ENCOUNTER — Ambulatory Visit (INDEPENDENT_AMBULATORY_CARE_PROVIDER_SITE_OTHER): Payer: BC Managed Care – PPO | Admitting: Internal Medicine

## 2021-02-14 VITALS — BP 107/58 | HR 81 | Temp 97.5°F | Resp 17 | Ht 73.0 in | Wt 166.6 lb

## 2021-02-14 DIAGNOSIS — M545 Low back pain, unspecified: Secondary | ICD-10-CM

## 2021-02-14 MED ORDER — CYCLOBENZAPRINE HCL 10 MG PO TABS: 10.0000 mg | ORAL_TABLET | Freq: Every evening | ORAL | 0 refills | Status: DC | PRN

## 2021-02-14 MED ORDER — TRAMADOL HCL 50 MG PO TABS: 50.0000 mg | ORAL_TABLET | Freq: Three times a day (TID) | ORAL | 0 refills | Status: AC | PRN

## 2021-02-14 NOTE — Progress Notes (Signed)
Subjective:    Patient ID: Tristan [name], male    DOB: 11-20-1980, 40 y.o.   MRN: [id number]  HPI  Patient presents the clinic today with complaint of back pain.  This started 1.65 weeks ago.  He describes the pain as achy but the pain can be sharp when he lifts something. The pain radiates into his buttocks. He denies loss of bowel or bladder control. He denies numbness, tingling or weakness in his lower extremities. He has had some back pain in the past but he reports this resolved quickly. He has never had back surgery. He does heavy lifting at work and does not wear a back brace.  Review of Systems     Past Medical History:  Diagnosis Date   Abdominal pain    Anemia    Asthma    Colon cancer ([place])    Family history of breast cancer    Family history of colon cancer    GERD (gastroesophageal reflux disease)    Nausea    Poor appetite    Weight loss     Current Outpatient Medications  Medication Sig Dispense Refill   busPIRone (BUSPAR) 10 MG tablet Take 1 tablet (10 mg total) by mouth 3 (three) times daily as needed (anxiety). 270 tablet 1   hydrOXYzine (ATARAX/VISTARIL) 10 MG tablet Take 1-2 tablets (10-20 mg total) by mouth 3 (three) times daily as needed. 60 tablet 2   triamcinolone cream (KENALOG) 0.5 % Apply 1 application topically 2 (two) times daily. To affected areas, for up to 2 weeks. As needed 30 g 3   No current facility-administered medications for this visit.   Facility-Administered Medications Ordered in Other Visits  Medication Dose Route Frequency Provider Last Rate Last Admin   sodium chloride flush (NS) 0.9 % injection 10 mL  10 mL Intravenous PRN Sindy Guadeloupe, MD   10 mL at 11/09/19 1537    No Known Allergies  Family History  Problem Relation Age of Onset   Diabetes Mother 43       Type 1   Breast cancer Mother 14   Healthy Father    Colon polyps Father    Ulcerative colitis Paternal Aunt    Multiple sclerosis Paternal Aunt    Dementia  Paternal Uncle    Breast cancer Maternal Grandmother 54   Breast cancer Paternal Grandmother 74   Rectal cancer Maternal Great-grandmother    Colon cancer Cousin     Social History   Socioeconomic History   Marital status: Single    Spouse name: Not on file   Number of children: Not on file   Years of education: Not on file   Highest education level: Not on file  Occupational History   Not on file  Tobacco Use   Smoking status: Every Day    Packs/day: 0.50    Years: 20.00    Pack years: 10.00    Types: Cigarettes   Smokeless tobacco: Never  Vaping Use   Vaping Use: Never used  Substance and Sexual Activity   Alcohol use: Yes    Alcohol/week: 3.0 standard drinks    Types: 3 Standard drinks or equivalent per week    Comment: maybe 2-3 beers a month   Drug use: Yes    Types: Marijuana   Sexual activity: Yes    Birth control/protection: Condom  Other Topics Concern   Not on file  Social History Narrative   Not on file   Social Determinants of  Health   Financial Resource Strain: Not on file  Food Insecurity: Not on file  Transportation Needs: Not on file  Physical Activity: Not on file  Stress: Not on file  Social Connections: Not on file  Intimate Partner Violence: Not on file     Constitutional: Denies fever, malaise, fatigue, headache or abrupt weight changes.  Respiratory: Denies difficulty breathing, shortness of breath, cough or sputum production.   Cardiovascular: Denies chest pain, chest tightness, palpitations or swelling in the hands or feet.  Gastrointestinal: Denies abdominal pain, bloating, constipation, diarrhea or blood in the stool.  GU: Denies urgency, frequency, pain with urination, burning sensation, blood in urine, odor or discharge. Musculoskeletal: Patient reports low back pain.  Denies decrease in range of motion, difficulty with gait, or joint swelling.  Neurological: Denies numbness, tingling, weakness, or problems with balance and  coordination.    No other specific complaints in a complete review of systems (except as listed in HPI above).  Objective:   Physical Exam   BP (!) 107/58 (BP Location: Right Arm, Patient Position: Sitting, Cuff Size: Normal)   Pulse 81   Temp (!) 97.5 F (36.4 C) (Temporal)   Resp 17   Ht 6\' 1"  (1.854 m)   Wt 166 lb 9.6 oz (75.6 kg)   SpO2 100%   BMI 21.98 kg/m   Wt Readings from Last 3 Encounters:  01/12/21 166 lb 3.2 oz (75.4 kg)  01/10/21 164 lb (74.4 kg)  11/28/20 156 lb 3.2 oz (70.9 kg)    General: Appears his stated age, well developed, well nourished in NAD. Skin: Warm, dry and intact. No rashes noted. Cardiovascular: Normal rate and rhythm. S1,S2 noted.  No murmur, rubs or gallops noted.  Pulmonary/Chest: Normal effort and positive vesicular breath sounds. No respiratory distress. No wheezes, rales or ronchi noted.  Musculoskeletal: Normal flexion, extension, rotation lateral bending of the spine.  No bony tenderness noted over lumbar spine.  Pain with palpation of bilateral paralumbar muscles.  Strength 5/5 BLE.  No difficulty with gait.  Neurological: Alert and oriented.  BMET    Component Value Date/Time   NA 138 06/14/2020 1348   K 4.1 06/14/2020 1348   CL 100 06/14/2020 1348   CO2 29 06/14/2020 1348   GLUCOSE 107 (H) 06/14/2020 1348   BUN 11 06/14/2020 1348   CREATININE 1.04 06/14/2020 1348   CALCIUM 9.2 06/14/2020 1348   GFRNONAA >60 06/14/2020 1348   GFRAA >60 11/09/2019 1525    Lipid Panel  No results found for: CHOL, TRIG, HDL, CHOLHDL, VLDL, LDLCALC  CBC    Component Value Date/Time   WBC 5.5 06/14/2020 1348   RBC 4.42 06/14/2020 1348   HGB 12.9 (L) 06/14/2020 1348   HCT 39.2 06/14/2020 1348   PLT 173 06/14/2020 1348   MCV 88.7 06/14/2020 1348   MCH 29.2 06/14/2020 1348   MCHC 32.9 06/14/2020 1348   RDW 14.6 06/14/2020 1348   LYMPHSABS 0.7 06/14/2020 1348   MONOABS 0.6 06/14/2020 1348   EOSABS 0.1 06/14/2020 1348   BASOSABS 0.0  06/14/2020 1348    Hgb A1C No results found for: HGBA1C         Assessment & Plan:   Acute low back pain without sciatica:  Failed NSAIDs OTC Encourage stretching exercises Rx for Tramadol 50 mg every 8 hours as needed for severe pain Recommend Tylenol 1000 mg twice daily from mild to moderate pain Rx for Flexeril 10 mg nightly as needed-sedation caution given Work note  provided  Return precautions discussed  Webb Silversmith, NP This visit occurred during the SARS-CoV-2 public health emergency.  Safety protocols were in place, including screening questions prior to the visit, additional usage of staff PPE, and extensive cleaning of exam room while observing appropriate contact time as indicated for disinfecting solutions.

## 2021-02-14 NOTE — Patient Instructions (Signed)

## 2021-02-22 ENCOUNTER — Ambulatory Visit
Admission: RE | Admit: 2021-02-22 | Discharge: 2021-02-22 | Disposition: A | Discharge status: Home / Self Care | Payer: BC Managed Care – PPO | Source: Ambulatory Visit | Attending: Internal Medicine | Admitting: Internal Medicine

## 2021-02-22 ENCOUNTER — Ambulatory Visit (INDEPENDENT_AMBULATORY_CARE_PROVIDER_SITE_OTHER): Payer: BC Managed Care – PPO | Admitting: Internal Medicine

## 2021-02-22 ENCOUNTER — Encounter: Payer: Self-pay | Admitting: Internal Medicine

## 2021-02-22 ENCOUNTER — Other Ambulatory Visit: Payer: Self-pay

## 2021-02-22 ENCOUNTER — Ambulatory Visit
Admission: RE | Admit: 2021-02-22 | Discharge: 2021-02-22 | Disposition: A | Discharge status: Home / Self Care | Payer: BC Managed Care – PPO | Attending: Internal Medicine | Admitting: Internal Medicine

## 2021-02-22 VITALS — BP 110/54 | HR 85 | Ht 73.0 in | Wt 166.8 lb

## 2021-02-22 DIAGNOSIS — M545 Low back pain, unspecified: Secondary | ICD-10-CM | POA: Insufficient documentation

## 2021-02-22 MED ORDER — HYDROCODONE-ACETAMINOPHEN 5-325 MG PO TABS: 1.0000 | ORAL_TABLET | Freq: Three times a day (TID) | ORAL | 0 refills | Status: DC | PRN

## 2021-02-22 NOTE — Patient Instructions (Signed)

## 2021-02-22 NOTE — Progress Notes (Signed)
Subjective:    Patient ID: Tristan [name], male    DOB: June 10, 1980, 40 y.o.   MRN: [id number]  HPI  Pt presents to the clinic today for follow up of back pain. He reorts this started 2-3 weeks ago. He describes the pain as sharp. The pain is worse with lifting. The pain no longer radiates into his buttocks. He denies numbness, tingling or weakness of his lower extremities. He was seen 12/6 for the same, diagnosed with low back pain without sciatica. No xray was obtained at that time. He was given a RX for Flexeril and Tramadol. He reports those medication have not touched his pain.  Review of Systems     Past Medical History:  Diagnosis Date   Abdominal pain    Anemia    Asthma    Colon cancer ([place])    Family history of breast cancer    Family history of colon cancer    GERD (gastroesophageal reflux disease)    Nausea    Poor appetite    Weight loss     Current Outpatient Medications  Medication Sig Dispense Refill   busPIRone (BUSPAR) 10 MG tablet Take 1 tablet (10 mg total) by mouth 3 (three) times daily as needed (anxiety). 270 tablet 1   cyclobenzaprine (FLEXERIL) 10 MG tablet Take 1 tablet (10 mg total) by mouth at bedtime as needed for muscle spasms. 20 tablet 0   hydrOXYzine (ATARAX/VISTARIL) 10 MG tablet Take 1-2 tablets (10-20 mg total) by mouth 3 (three) times daily as needed. 60 tablet 2   triamcinolone cream (KENALOG) 0.5 % Apply 1 application topically 2 (two) times daily. To affected areas, for up to 2 weeks. As needed 30 g 3   No current facility-administered medications for this visit.   Facility-Administered Medications Ordered in Other Visits  Medication Dose Route Frequency Provider Last Rate Last Admin   sodium chloride flush (NS) 0.9 % injection 10 mL  10 mL Intravenous PRN Sindy Guadeloupe, MD   10 mL at 11/09/19 1537    No Known Allergies  Family History  Problem Relation Age of Onset   Diabetes Mother 38       Type 1   Breast cancer Mother 75    Healthy Father    Colon polyps Father    Ulcerative colitis Paternal Aunt    Multiple sclerosis Paternal Aunt    Dementia Paternal Uncle    Breast cancer Maternal Grandmother 42   Breast cancer Paternal Grandmother 39   Rectal cancer Maternal Great-grandmother    Colon cancer Cousin     Social History   Socioeconomic History   Marital status: Single    Spouse name: Not on file   Number of children: Not on file   Years of education: Not on file   Highest education level: Not on file  Occupational History   Not on file  Tobacco Use   Smoking status: Every Day    Packs/day: 0.50    Years: 20.00    Pack years: 10.00    Types: Cigarettes   Smokeless tobacco: Never  Vaping Use   Vaping Use: Never used  Substance and Sexual Activity   Alcohol use: Yes    Alcohol/week: 3.0 standard drinks    Types: 3 Standard drinks or equivalent per week    Comment: maybe 2-3 beers a month   Drug use: Yes    Types: Marijuana   Sexual activity: Yes    Birth control/protection: Condom  Other Topics Concern   Not on file  Social History Narrative   Not on file   Social Determinants of Health   Financial Resource Strain: Not on file  Food Insecurity: Not on file  Transportation Needs: Not on file  Physical Activity: Not on file  Stress: Not on file  Social Connections: Not on file  Intimate Partner Violence: Not on file     Constitutional: Denies fever, malaise, fatigue, headache or abrupt weight changes.  Respiratory: Denies difficulty breathing, shortness of breath, cough or sputum production.   Cardiovascular: Denies chest pain, chest tightness, palpitations or swelling in the hands or feet.  Gastrointestinal: Denies abdominal pain, bloating, constipation, diarrhea or blood in the stool.  GU: Denies urgency, frequency, pain with urination, burning sensation, blood in urine, odor or discharge. Musculoskeletal: Pt reports low back pain. Denies decrease in range of motion, difficulty  with gait, or joint swelling.  Skin: Denies redness, rashes, lesions or ulcercations.  Neurological: Denies numbness, tingling, weakness or problems with balance and coordination.   No other specific complaints in a complete review of systems (except as listed in HPI above).  Objective:   Physical Exam  BP (!) 110/54 (BP Location: Right Arm, Patient Position: Sitting, Cuff Size: Normal)    Pulse 85    Ht 6\' 1"  (1.854 m)    Wt 166 lb 12.8 oz (75.7 kg)    SpO2 100%    BMI 22.01 kg/m   Wt Readings from Last 3 Encounters:  02/14/21 166 lb 9.6 oz (75.6 kg)  01/12/21 166 lb 3.2 oz (75.4 kg)  01/10/21 164 lb (74.4 kg)    General: Appears his stated age, well developed, well nourished in NAD. Skin: Warm, dry and intact.  Cardiovascular: Normal rate and rhythm.  Pulmonary/Chest: Normal effort and positive vesicular breath sounds. Musculoskeletal: Normal flexion, extension and rotation of the spine. Bony tenderness noted over  the lumbar spine. Strength 5/5 BLE. No difficulty with gait.  Neurological: Alert and oriented.    BMET    Component Value Date/Time   NA 138 06/14/2020 1348   K 4.1 06/14/2020 1348   CL 100 06/14/2020 1348   CO2 29 06/14/2020 1348   GLUCOSE 107 (H) 06/14/2020 1348   BUN 11 06/14/2020 1348   CREATININE 1.04 06/14/2020 1348   CALCIUM 9.2 06/14/2020 1348   GFRNONAA >60 06/14/2020 1348   GFRAA >60 11/09/2019 1525    Lipid Panel  No results found for: CHOL, TRIG, HDL, CHOLHDL, VLDL, LDLCALC  CBC    Component Value Date/Time   WBC 5.5 06/14/2020 1348   RBC 4.42 06/14/2020 1348   HGB 12.9 (L) 06/14/2020 1348   HCT 39.2 06/14/2020 1348   PLT 173 06/14/2020 1348   MCV 88.7 06/14/2020 1348   MCH 29.2 06/14/2020 1348   MCHC 32.9 06/14/2020 1348   RDW 14.6 06/14/2020 1348   LYMPHSABS 0.7 06/14/2020 1348   MONOABS 0.6 06/14/2020 1348   EOSABS 0.1 06/14/2020 1348   BASOSABS 0.0 06/14/2020 1348    Hgb A1C No results found for: HGBA1C          Assessment & Plan:   Midline Low Back Pain without Sciatica:  Xray lumbar spine today Back exercises given RX for Hydrocodone 5-325 mg Q8H prn x 5 days- sedation and addiction caution given Work note provided  Will follow up after imaging with further recommendation and treatment plan  Webb Silversmith, NP This visit occurred during the SARS-CoV-2 public health emergency.  Safety protocols  were in place, including screening questions prior to the visit, additional usage of staff PPE, and extensive cleaning of exam room while observing appropriate contact time as indicated for disinfecting solutions.

## 2021-02-23 ENCOUNTER — Encounter: Payer: Self-pay | Admitting: Internal Medicine

## 2021-02-23 DIAGNOSIS — M545 Low back pain, unspecified: Secondary | ICD-10-CM

## 2021-03-08 ENCOUNTER — Other Ambulatory Visit: Payer: Self-pay

## 2021-03-08 ENCOUNTER — Ambulatory Visit (LOCAL_COMMUNITY_HEALTH_CENTER): Payer: BLUE CROSS/BLUE SHIELD

## 2021-03-08 ENCOUNTER — Ambulatory Visit: Payer: BLUE CROSS/BLUE SHIELD

## 2021-03-08 DIAGNOSIS — Z23 Encounter for immunization: Secondary | ICD-10-CM

## 2021-03-08 DIAGNOSIS — Z719 Counseling, unspecified: Secondary | ICD-10-CM

## 2021-03-08 NOTE — Progress Notes (Signed)
Patient in nurse clinic for HPV 2nd dose. Patient tolerated well. [facility] updated and copy given to patient. Ranae Palms, RN

## 2021-04-06 ENCOUNTER — Other Ambulatory Visit: Payer: Self-pay

## 2021-04-06 ENCOUNTER — Ambulatory Visit: Payer: Self-pay | Admitting: Nurse Practitioner

## 2021-04-13 ENCOUNTER — Ambulatory Visit: Payer: Self-pay | Admitting: Family Medicine

## 2021-04-13 ENCOUNTER — Other Ambulatory Visit: Payer: Self-pay

## 2021-04-13 DIAGNOSIS — A630 Anogenital (venereal) warts: Secondary | ICD-10-CM

## 2021-04-13 NOTE — Progress Notes (Signed)
Pt here for Cryo treatment only.  Pt declined condoms.  Windle Guard, RN

## 2021-04-13 NOTE — Addendum Note (Signed)
Addended by: Junious Dresser on: 04/13/2021 03:48 PM   Modules accepted: Level of Service

## 2021-04-13 NOTE — Progress Notes (Signed)
SGean Larose [id number] y.o. comes into clinic today for cryotx.  Denies any problems with previous treatments.  Reports that the HPV have improved since the last treatment.  Desires to proceed with cryotx today.  O: genital warts on penis   A: 1. Genital warts  P: WDWN male in NAD, A&O x 3; skin is warm and dry with genital warts.  No edema, erythema, or ulcerative lesions present today.  -Cryo treatment in 3 freeze/thaw cycles today.  Patient tolerated procedure well. -Reviewed with patient after-care instructions and when to call clinic. -No sex until area has completely healed. -Rec condoms with all sex. -RTC in 10-14 days for next treatment if needed.   Junious Dresser, FNP

## 2021-06-02 ENCOUNTER — Other Ambulatory Visit: Payer: Self-pay

## 2021-06-02 ENCOUNTER — Ambulatory Visit: Payer: Self-pay | Admitting: Nurse Practitioner

## 2021-06-02 DIAGNOSIS — A630 Anogenital (venereal) warts: Secondary | ICD-10-CM

## 2021-06-02 NOTE — Progress Notes (Signed)
Pt here for treatment only. Seen by provider and no testing.  ?B WiedenheftRN ?

## 2021-06-02 NOTE — Progress Notes (Addendum)
S: 40 year old male in clinic today for crytox.  Denies any current problems or signs or symptoms.  Has had previous treatments in the past without difficulty.  HPV improvement noted with treatment.     O: genital warts on penis    A: 1. Genital warts   P: WDWN male in NAD, A&O x 3; skin is warm and dry with genital warts.  No edema, erythema, or ulcerative lesions present today.  -Cryo treatment in 3 freeze/thaw cycles today.  Patient tolerated procedure well. -Reviewed with patient after-care instructions and when to call clinic. -No sex until area has completely healed. -Rec condoms with all sex. -RTC in 10-14 days for next treatment if needed. Annajulia Lewing, FNP  

## 2021-07-04 IMAGING — US US RENAL
2 series · 14 of 25 positions shown · non-contrast
Comparison: CT abdomen/pelvis 01/21/2019

CLINICAL DATA: Acute kidney failure, unspecified.

EXAM:
RENAL / URINARY TRACT ULTRASOUND COMPLETE

[Series 1: us renal · 13 of 27 slices shown (1 of 2)]
[im 1/27]
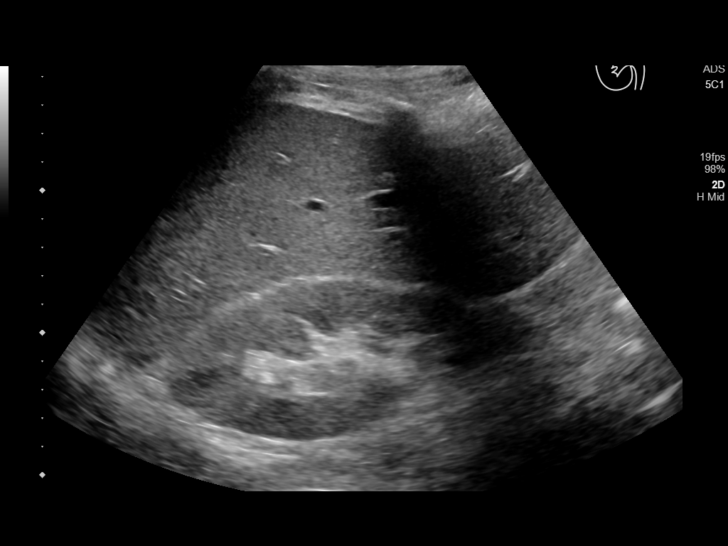
[im 3/27]
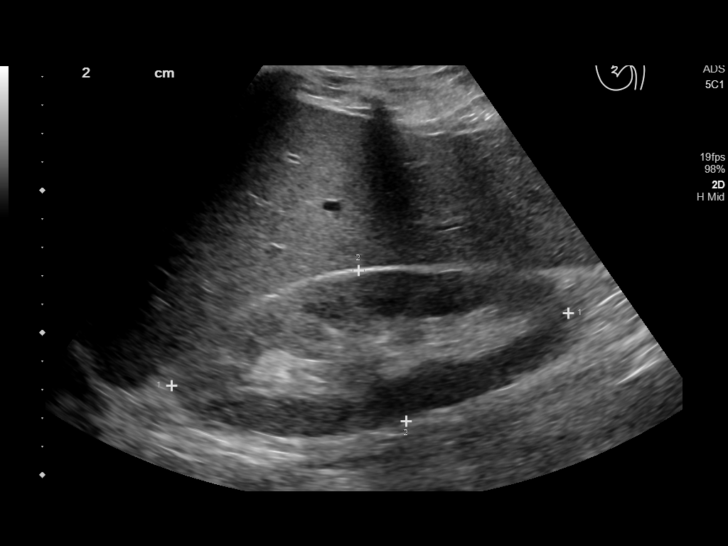
[im 5/27]
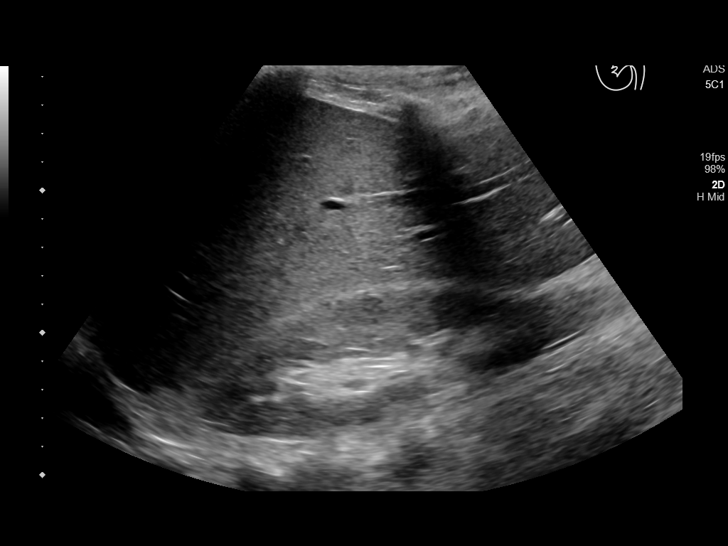
[im 7/27]
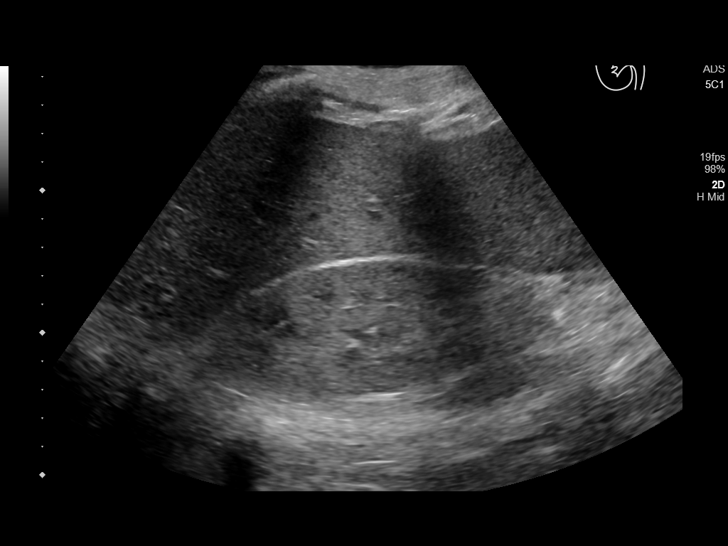
[im 10/27]
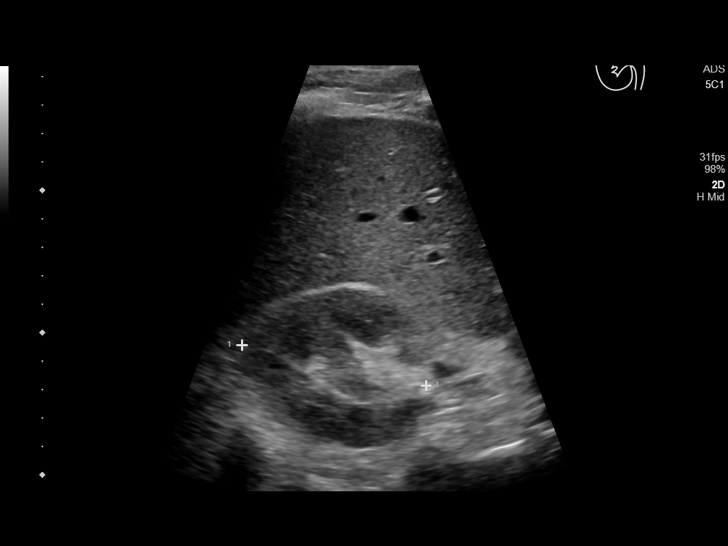
[im 11/27]
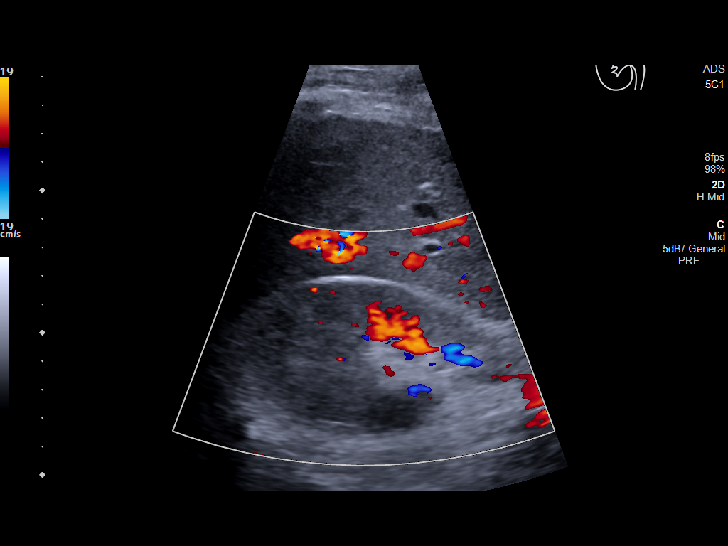
[im 13/27]
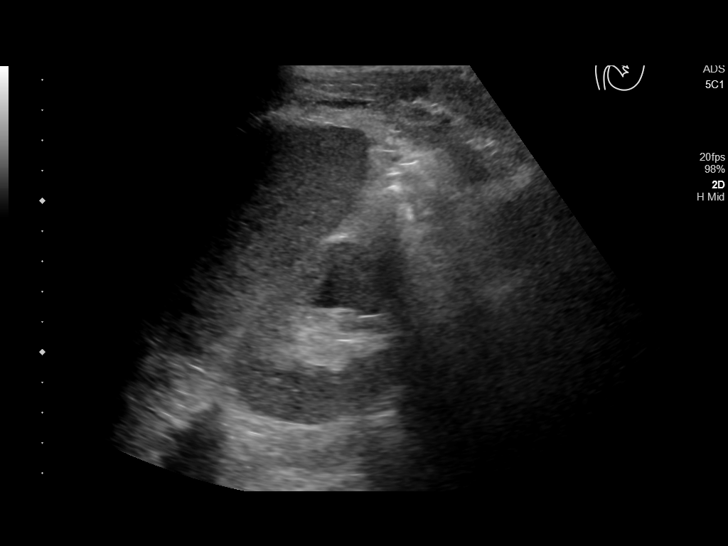
[im 15/27]
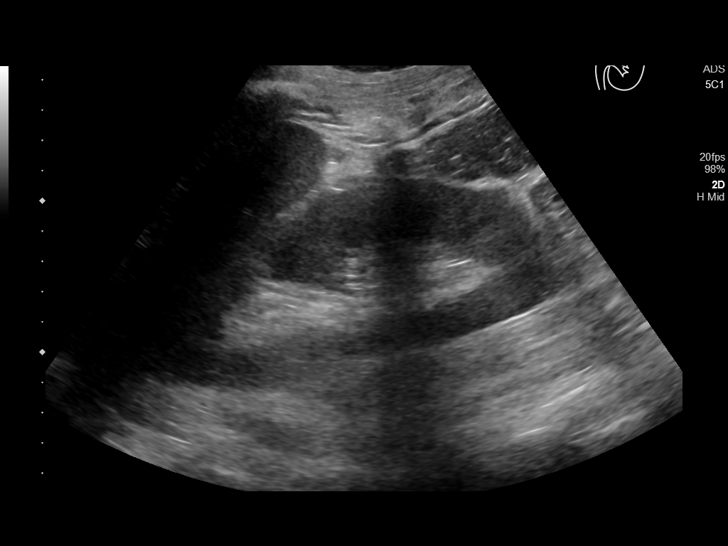
[im 17/27]
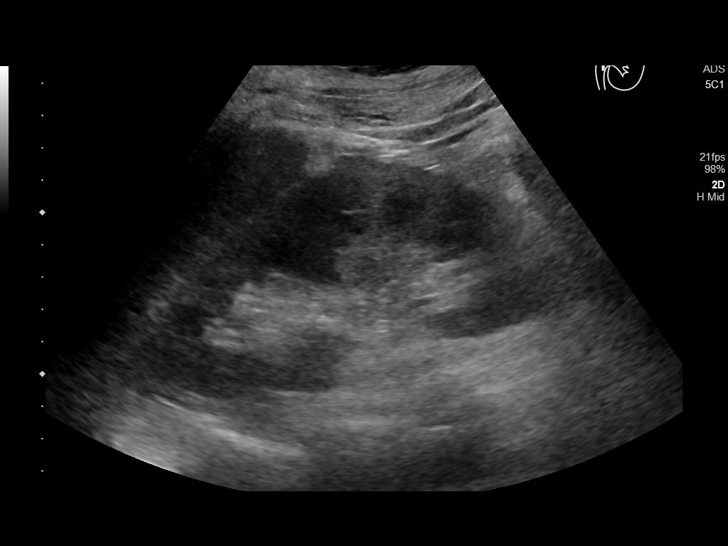
[im 19/27]
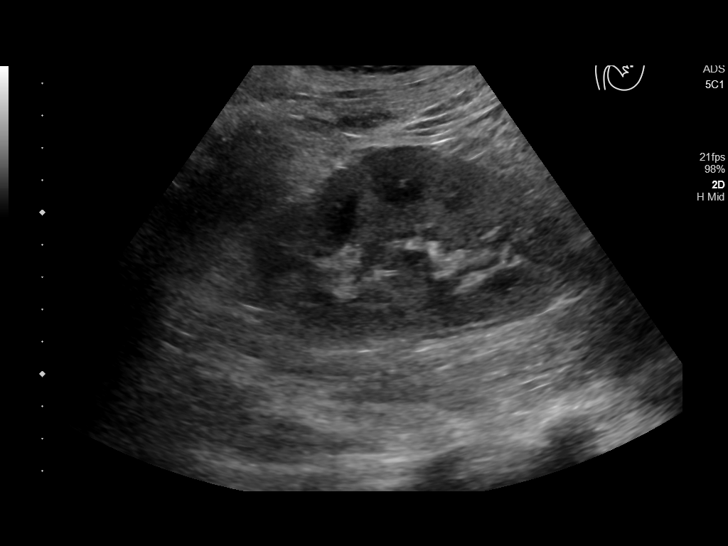
[im 21/27]
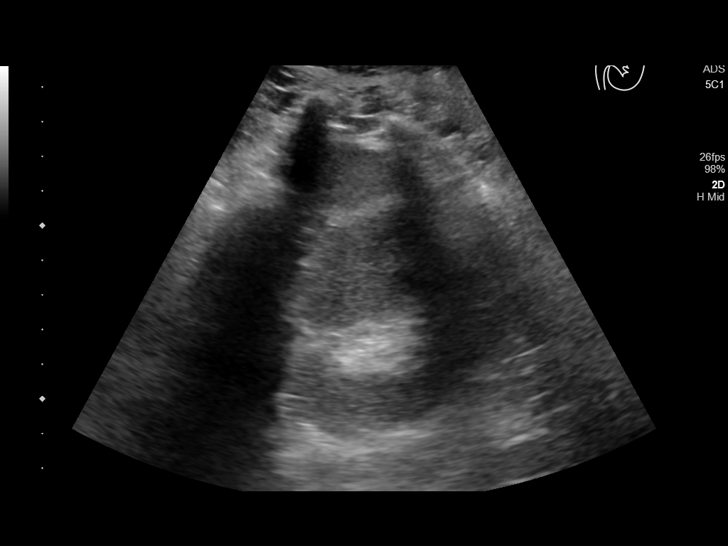
[im 23/27]
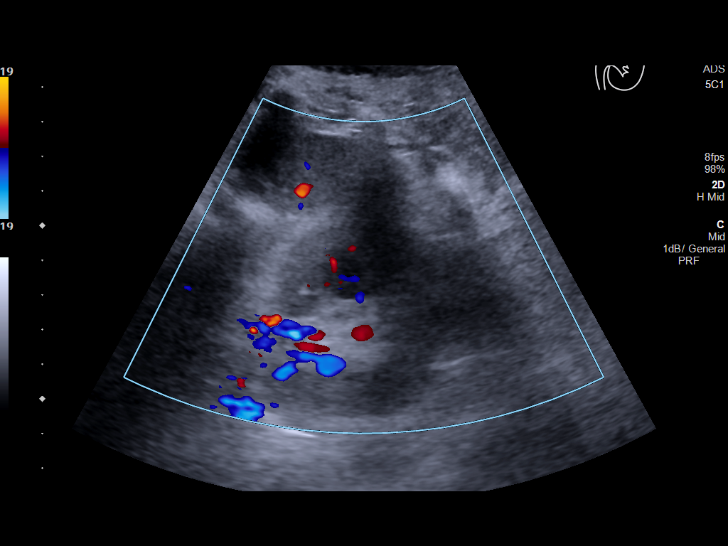
[im 25/27]
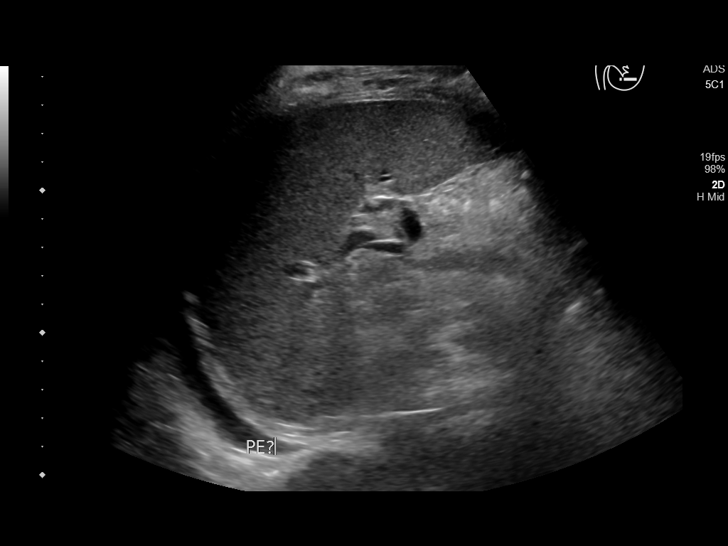

[Series 1001: us renal · 1 of 1 slices shown (2 of 2)]
[im 1/1]
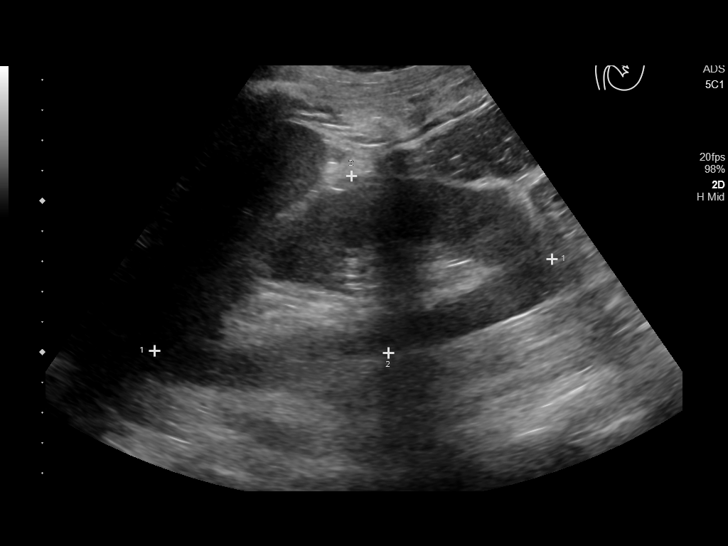

[14 of 25 positions shown; findings below may reference images not displayed]

FINDINGS: Right Kidney:

Renal measurements: 14.2 x 5.6 x 6.6 cm = volume: 279.9 mL .
Echogenicity within normal limits. No mass or hydronephrosis
visualized.

Left Kidney:

Renal measurements: 13.4 x 6.0 x 5.4 cm = volume: 228.7 mL.
Echogenicity within normal limits. No mass or hydronephrosis
visualized.

Bladder:

The bladder is not visualized.

Other: Probable left pleural effusion.
IMPRESSION: No hydronephrosis.  Renal cortical echogenicity is unremarkable.

The bladder is not visualized.

Probable left pleural effusion

## 2021-07-12 ENCOUNTER — Ambulatory Visit: Payer: Self-pay | Admitting: Nurse Practitioner

## 2021-07-12 ENCOUNTER — Encounter: Payer: Self-pay | Admitting: Nurse Practitioner

## 2021-07-12 DIAGNOSIS — A630 Anogenital (venereal) warts: Secondary | ICD-10-CM

## 2021-07-12 NOTE — Progress Notes (Signed)
S: 40 year old male in clinic today for crytox.  Denies any current problems or signs or symptoms.  Has had previous treatments in the past without difficulty.  HPV improvement noted with treatment.     O: genital warts on penis    A: 1. Genital warts   P: WDWN male in NAD, A&O x 3; skin is warm and dry with genital warts.  No edema, erythema, or ulcerative lesions present today.  -Cryo treatment in 3 freeze/thaw cycles today.  Patient tolerated procedure well. -Reviewed with patient after-care instructions and when to call clinic. -No sex until area has completely healed. -Rec condoms with all sex. -RTC in 10-14 days for next treatment if needed. Jaisean Monteforte, FNP  

## 2021-07-12 NOTE — Progress Notes (Signed)
Patient here for tx only.  ?

## 2021-07-20 ENCOUNTER — Ambulatory Visit: Payer: Self-pay | Admitting: Nurse Practitioner

## 2021-07-20 ENCOUNTER — Encounter: Payer: Self-pay | Admitting: Nurse Practitioner

## 2021-07-20 DIAGNOSIS — A630 Anogenital (venereal) warts: Secondary | ICD-10-CM

## 2021-07-20 NOTE — Progress Notes (Signed)
S: 40 year old male in clinic today for crytox.  Denies any current problems or signs or symptoms.  Has had previous treatments in the past without difficulty.  HPV improvement noted with treatment.     O: genital warts on penis    A: 1. Genital warts   P: WDWN male in NAD, A&O x 3; skin is warm and dry with genital warts.  No edema, erythema, or ulcerative lesions present today.  -Cryo treatment in 3 freeze/thaw cycles today.  Patient tolerated procedure well. -Reviewed with patient after-care instructions and when to call clinic. -No sex until area has completely healed. -Rec condoms with all sex. -RTC in 10-14 days for next treatment if needed. Jerre Vandrunen, FNP  

## 2021-07-20 NOTE — Progress Notes (Signed)
Pt is here for treatment.  Condoms declined.  Windle Guard, RN ? ?

## 2021-07-22 IMAGING — CT CT CHEST W/ CM
3 of 5 series · 14 of 36 positions shown, 16 images · IV contrast (omnipaque)
Comparison: AP CT on 01/21/2019

CLINICAL DATA: Newly diagnosed right colon carcinoma. Status post
right colectomy. Worsening abdominal pain for several days, with
nausea, vomiting, and diarrhea.

EXAM:
CT CHEST, ABDOMEN, AND PELVIS WITH CONTRAST
TECHNIQUE: Multidetector CT imaging of the chest, abdomen and pelvis was
performed following the standard protocol during bolus
administration of intravenous contrast.
CONTRAST:  75mL OMNIPAQUE IOHEXOL 300 MG/ML  SOLN

[Series 2: axials cap 5.00 · axial · 0.81mm/px · z∈[-1560,-970]mm · 8 of 152 slices shown, 10 images]
[im 17/152  mediastinal]
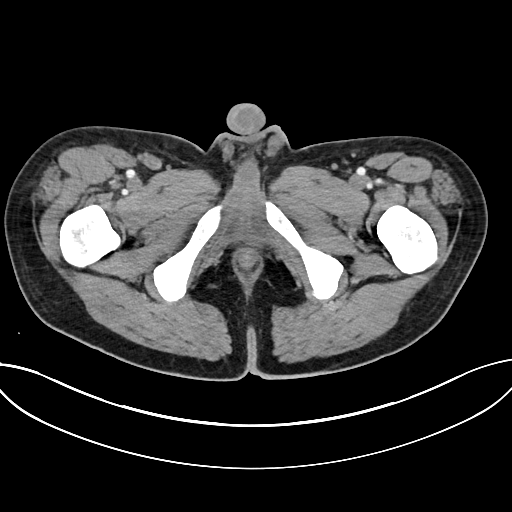
[im 17/152  lung]
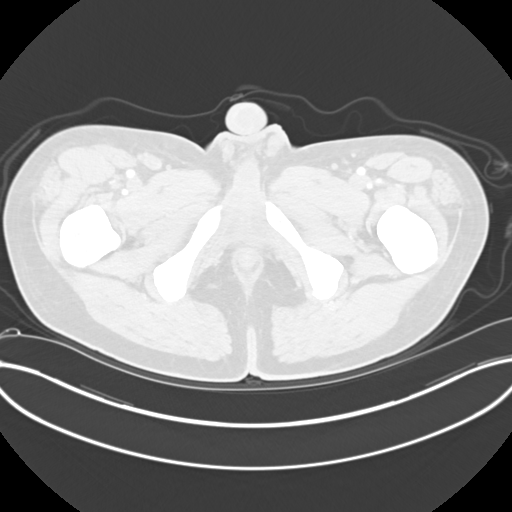
[im 34/152  lung]
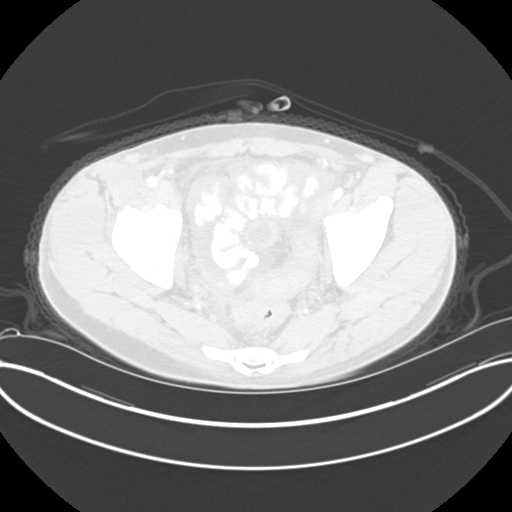
[im 51/152  lung]
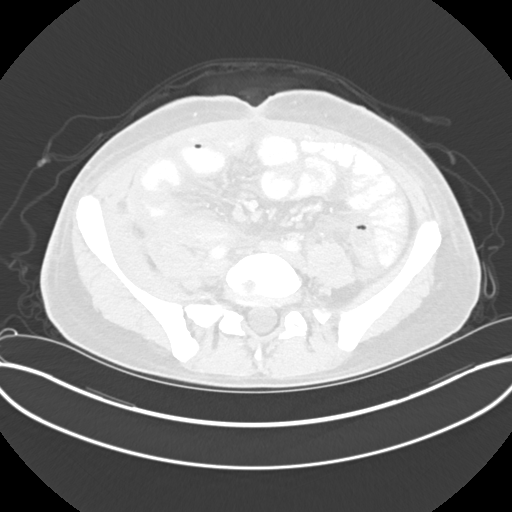
[im 68/152  lung]
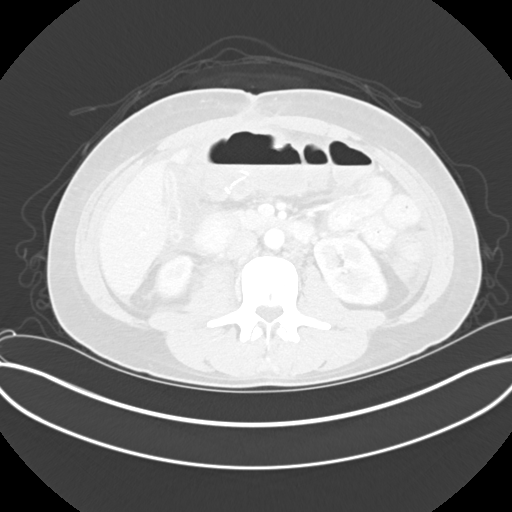
[im 84/152  mediastinal]
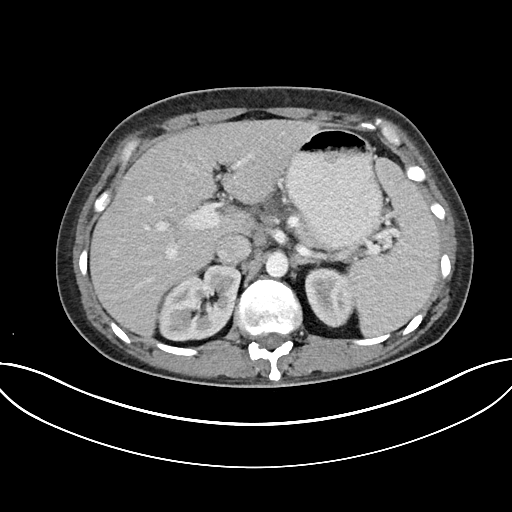
[im 84/152  lung]
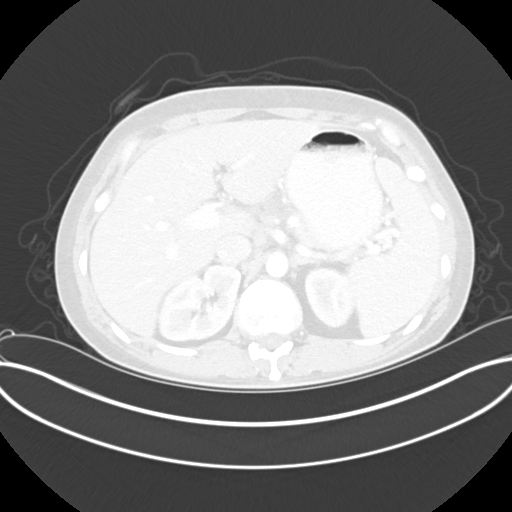
[im 101/152  lung]
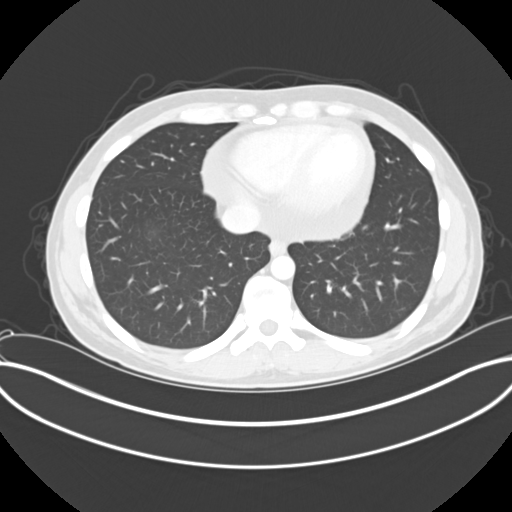
[im 118/152  lung]
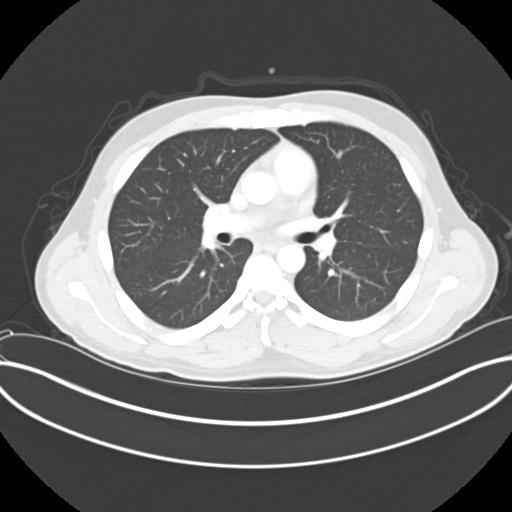
[im 135/152  lung]
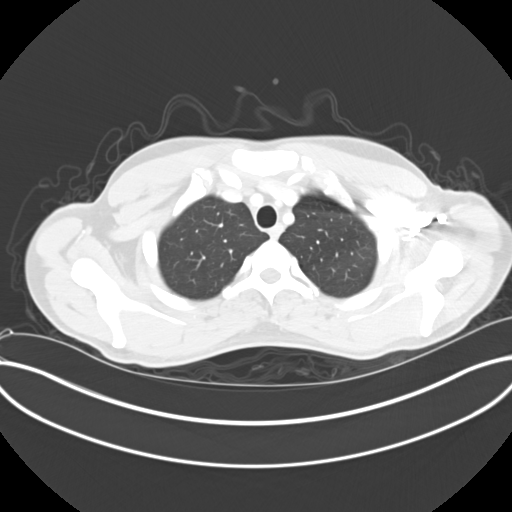

[Series 3: lungs cap 2.00 · axial · 0.81mm/px · z∈[-1216,-1116]mm · 3 of 183 slices shown]
[im 17/183  lung]
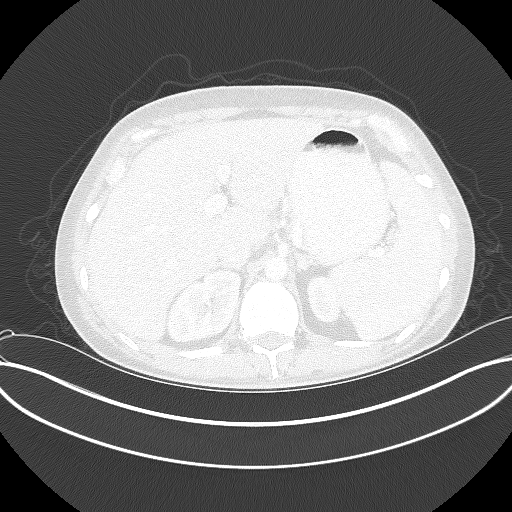
[im 34/183  lung]
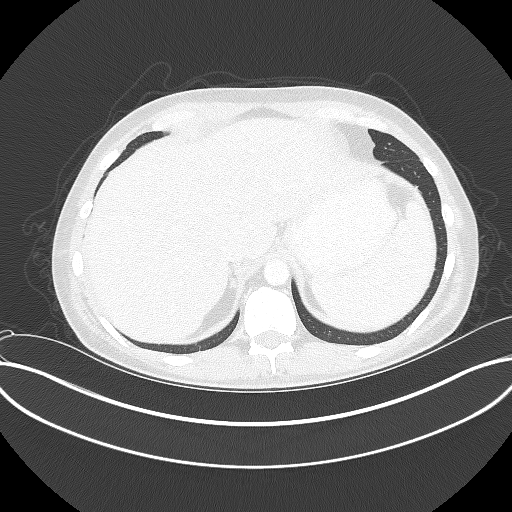
[im 67/183  lung]
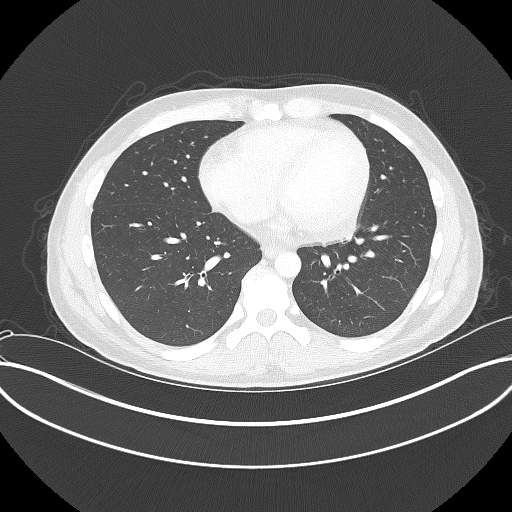

[Series 4: coronals cap 2.00 cor · coronal · 0.81mm/px · 3 of 127 slices shown]
[im 26/127  lung]
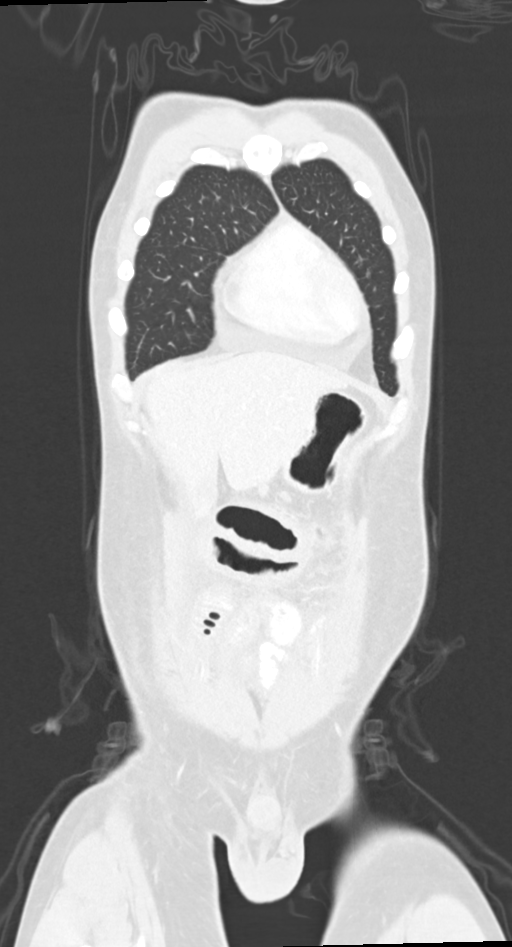
[im 51/127  lung]
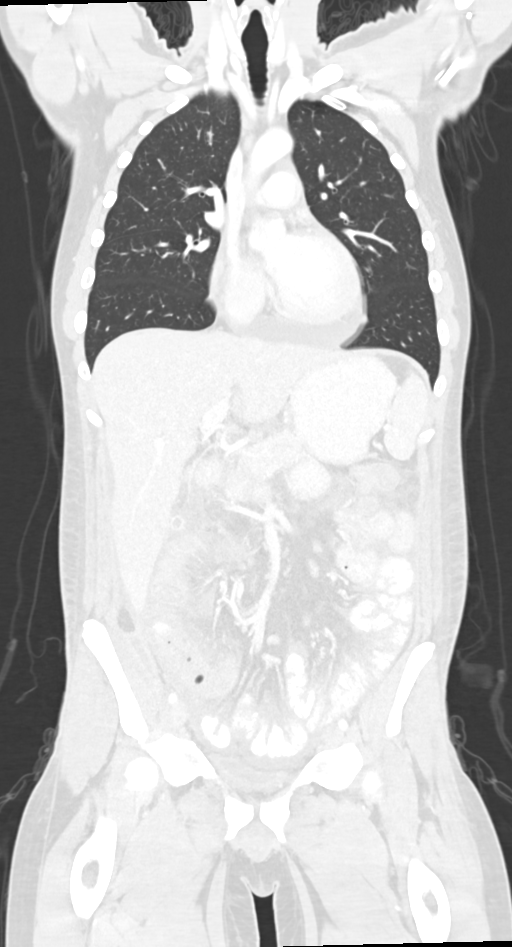
[im 76/127  lung]
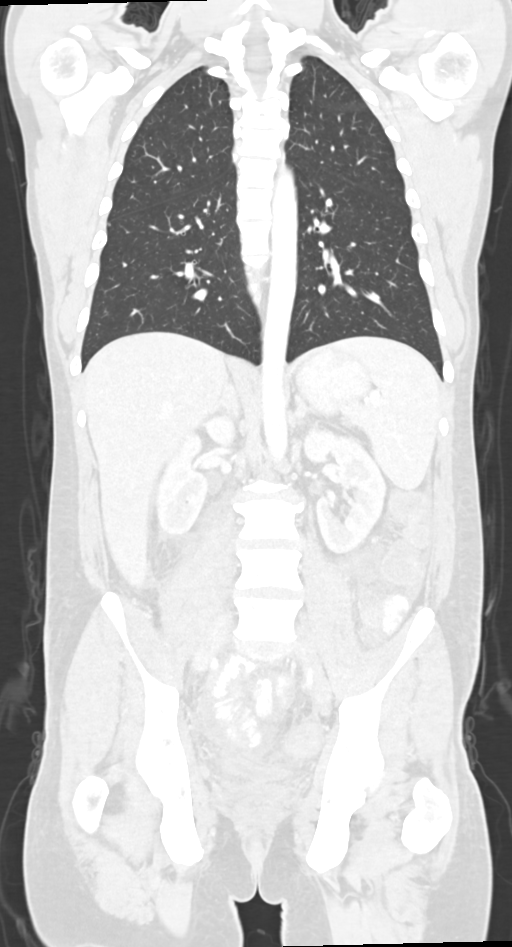

[14 of 36 positions shown; findings below may reference images not displayed]

FINDINGS: CT CHEST FINDINGS

Cardiovascular: Small pericardial effusion seen, also visible on
prior abdomen CT.

Mediastinum/Lymph Nodes: No masses or pathologically enlarged lymph
nodes identified.

Lungs/Pleura: No pulmonary infiltrate or mass identified. No
effusion present.

Musculoskeletal:  No suspicious bone lesions identified.

CT ABDOMEN AND PELVIS FINDINGS

Hepatobiliary: No masses identified. Gallbladder is nearly
completely flaps. No evidence of biliary ductal dilatation

Pancreas:  No mass or inflammatory changes.

Spleen:  Within normal limits in size and appearance.

Adrenals/Urinary tract:  No masses or hydronephrosis.

Stomach/Bowel: Patient has undergone right colectomy since previous
study. Decreased dilatation of small bowel loops is seen. Mild
diffuse small bowel wall thickening is again seen which is greatest
involving the distal small bowel in the right and, consistent with
enteritis. Diffuse mesenteric soft tissue stranding is again noted
with small amount of pelvic ascites, however there is no evidence of
abscess.

Vascular/Lymphatic: Shotty sub-cm lymph nodes are seen in the small
bowel mesentery, similar to previous study. No pathologically
enlarged lymph nodes identified. No abdominal aortic aneurysm.

Reproductive:  No mass or other significant abnormality identified.

Other:  None.

Musculoskeletal: No suspicious bone lesions identified. Bilateral L5
pars defects again seen, without associated spondylolisthesis.
IMPRESSION: 1. Postop changes from right colectomy. Decreased small bowel
dilatation since previous study. No evidence of abscess.
2. Persistent diffuse small bowel wall thickening, consistent with
enteritis.
3. Diffuse mesenteric edema, and minimal ascites decreased since
prior exam.
4. Stable shotty sub-cm lymph nodes in central small bowel
mesentery, which are nonspecific. These may be reactive in etiology,
however metastatic disease cannot definitely be excluded.
5. Small pericardial effusion, also visible on prior abdomen CT.

## 2021-07-22 IMAGING — CT CT ABD-PELV W/ CM
3 of 5 series · 14 of 46 positions shown, 16 images · IV contrast (omnipaque)
Comparison: AP CT on 01/21/2019

CLINICAL DATA: Newly diagnosed right colon carcinoma. Status post
right colectomy. Worsening abdominal pain for several days, with
nausea, vomiting, and diarrhea.

EXAM:
CT CHEST, ABDOMEN, AND PELVIS WITH CONTRAST
TECHNIQUE: Multidetector CT imaging of the chest, abdomen and pelvis was
performed following the standard protocol during bolus
administration of intravenous contrast.
CONTRAST:  75mL OMNIPAQUE IOHEXOL 300 MG/ML  SOLN

[Series 2: axials cap 5.00 · axial · 0.81mm/px · z∈[-1565,-965]mm · 9 of 152 slices shown, 11 images]
[im 16/152  soft-tissue]
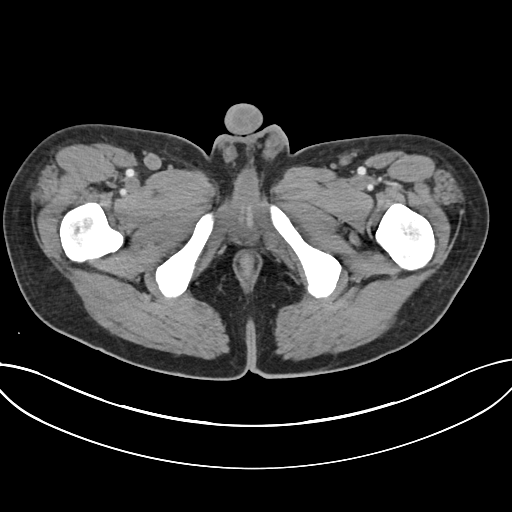
[im 16/152  bone]
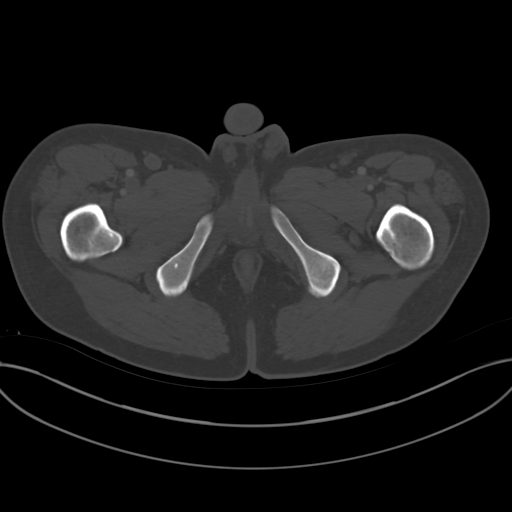
[im 31/152  soft-tissue]
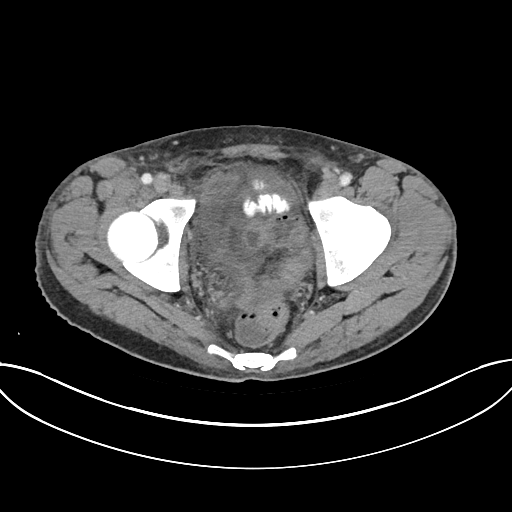
[im 46/152  soft-tissue]
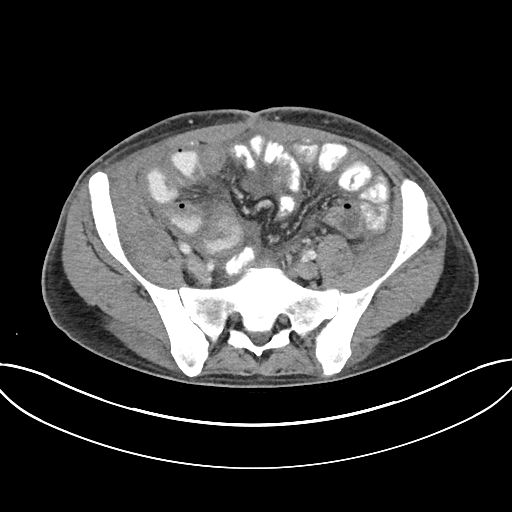
[im 61/152  soft-tissue]
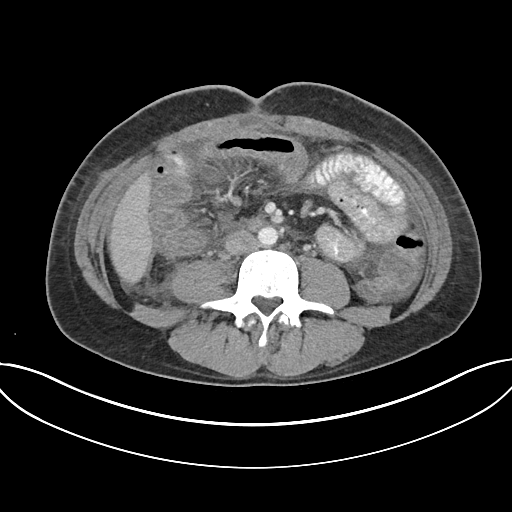
[im 76/152  soft-tissue]
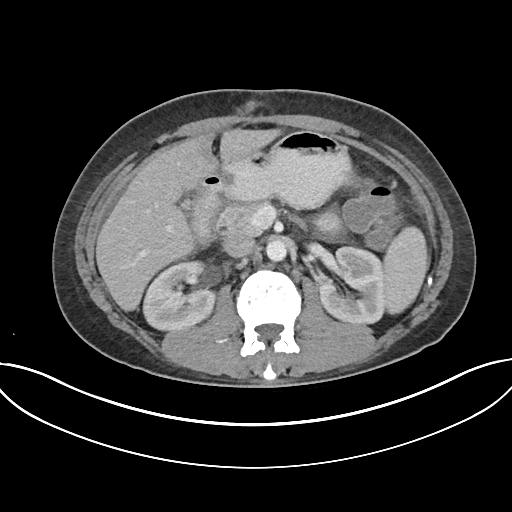
[im 91/152  soft-tissue]
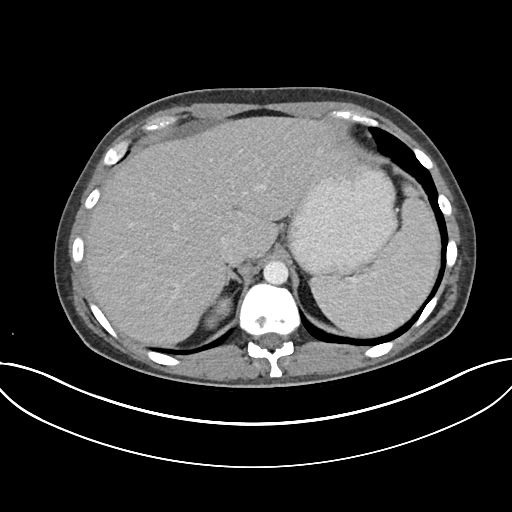
[im 106/152  soft-tissue]
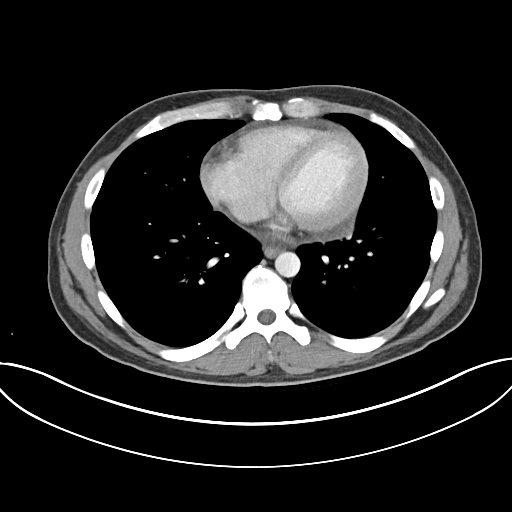
[im 121/152  soft-tissue]
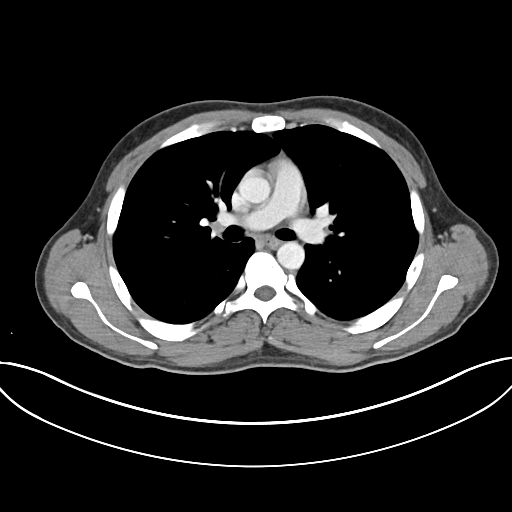
[im 136/152  soft-tissue]
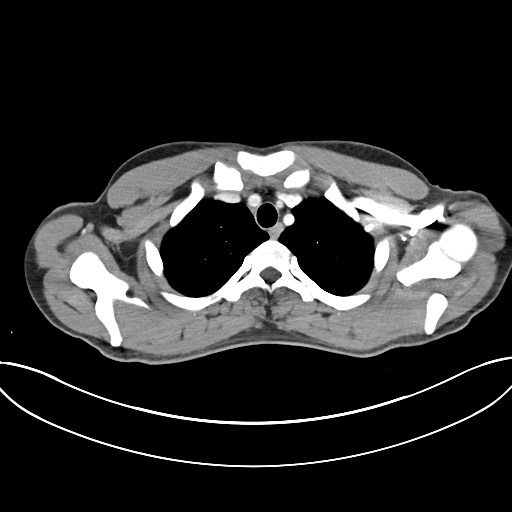
[im 136/152  bone]
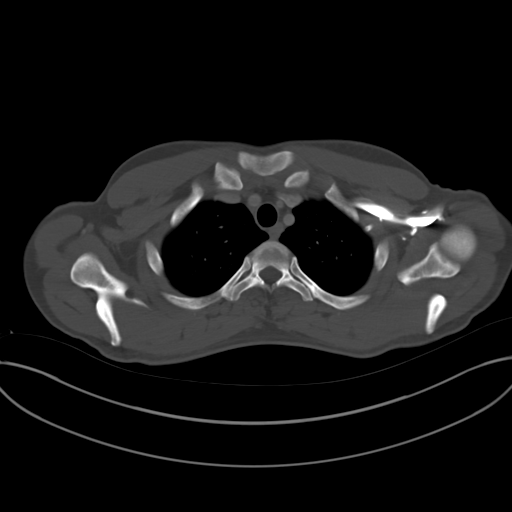

[Series 3: lungs cap 2.00 · axial · 0.81mm/px · z∈[-1220,-1164]mm · 2 of 183 slices shown]
[im 15/183  soft-tissue]
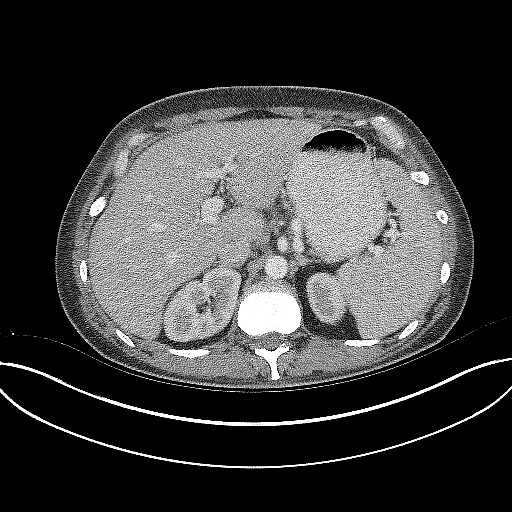
[im 43/183  soft-tissue]
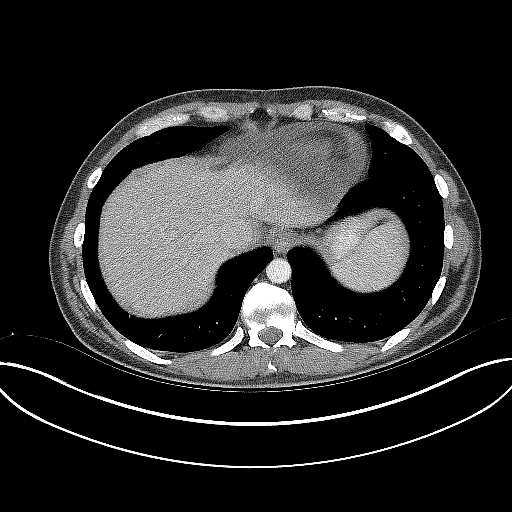

[Series 4: coronals cap 2.00 cor · coronal · 0.81mm/px · 3 of 127 slices shown]
[im 43/127  soft-tissue]
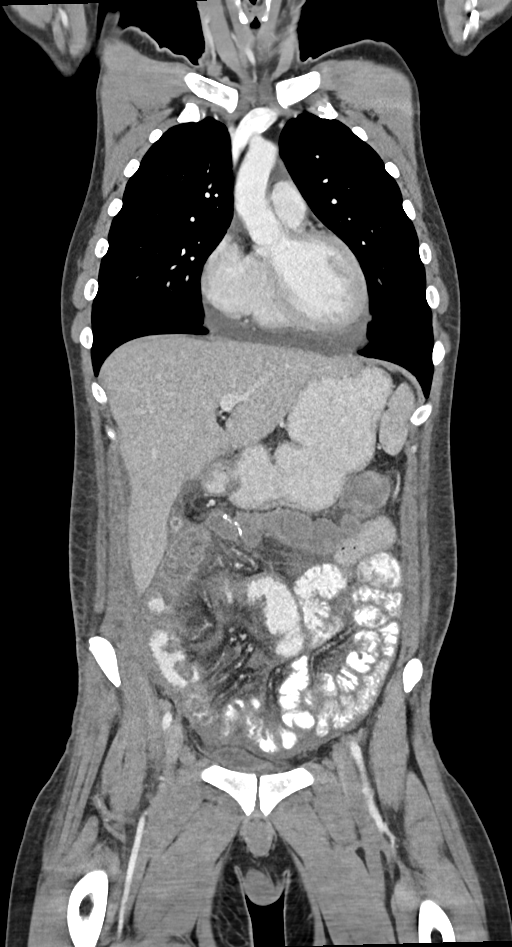
[im 57/127  soft-tissue]
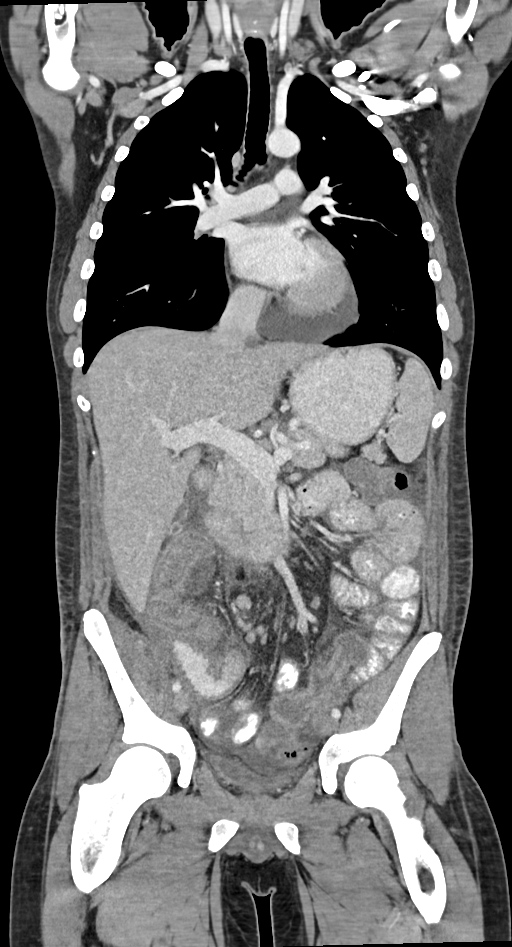
[im 71/127  soft-tissue]
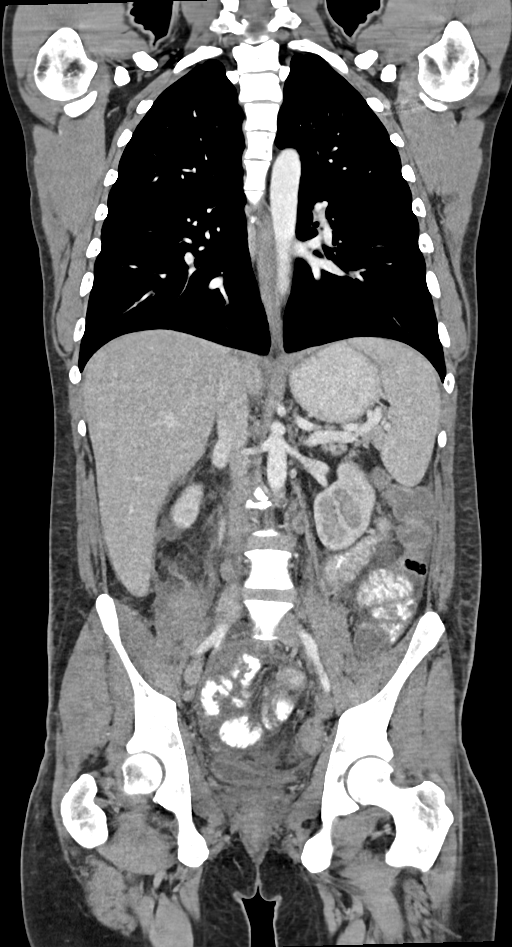

[14 of 46 positions shown; findings below may reference images not displayed]

FINDINGS: CT CHEST FINDINGS

Cardiovascular: Small pericardial effusion seen, also visible on
prior abdomen CT.

Mediastinum/Lymph Nodes: No masses or pathologically enlarged lymph
nodes identified.

Lungs/Pleura: No pulmonary infiltrate or mass identified. No
effusion present.

Musculoskeletal:  No suspicious bone lesions identified.

CT ABDOMEN AND PELVIS FINDINGS

Hepatobiliary: No masses identified. Gallbladder is nearly
completely flaps. No evidence of biliary ductal dilatation

Pancreas:  No mass or inflammatory changes.

Spleen:  Within normal limits in size and appearance.

Adrenals/Urinary tract:  No masses or hydronephrosis.

Stomach/Bowel: Patient has undergone right colectomy since previous
study. Decreased dilatation of small bowel loops is seen. Mild
diffuse small bowel wall thickening is again seen which is greatest
involving the distal small bowel in the right and, consistent with
enteritis. Diffuse mesenteric soft tissue stranding is again noted
with small amount of pelvic ascites, however there is no evidence of
abscess.

Vascular/Lymphatic: Shotty sub-cm lymph nodes are seen in the small
bowel mesentery, similar to previous study. No pathologically
enlarged lymph nodes identified. No abdominal aortic aneurysm.

Reproductive:  No mass or other significant abnormality identified.

Other:  None.

Musculoskeletal: No suspicious bone lesions identified. Bilateral L5
pars defects again seen, without associated spondylolisthesis.
IMPRESSION: 1. Postop changes from right colectomy. Decreased small bowel
dilatation since previous study. No evidence of abscess.
2. Persistent diffuse small bowel wall thickening, consistent with
enteritis.
3. Diffuse mesenteric edema, and minimal ascites decreased since
prior exam.
4. Stable shotty sub-cm lymph nodes in central small bowel
mesentery, which are nonspecific. These may be reactive in etiology,
however metastatic disease cannot definitely be excluded.
5. Small pericardial effusion, also visible on prior abdomen CT.

## 2021-08-03 ENCOUNTER — Ambulatory Visit (LOCAL_COMMUNITY_HEALTH_CENTER): Payer: Self-pay

## 2021-08-03 ENCOUNTER — Ambulatory Visit: Payer: Self-pay | Admitting: Family Medicine

## 2021-08-03 DIAGNOSIS — Z23 Encounter for immunization: Secondary | ICD-10-CM

## 2021-08-03 DIAGNOSIS — A630 Anogenital (venereal) warts: Secondary | ICD-10-CM

## 2021-08-03 NOTE — Progress Notes (Signed)
Tristan [name] [id number] y.o. comes into clinic today for cryotx.  Denies any problems with previous treatments.  Reports that the HPV have improved since the last treatment.  Desires to proceed with cryotx today. WDWN male in NAD, A&O x 3; skin is warm and dry with HPV.  No edema, erythema, or ulcerative lesions present today. Cryo treatment in 3 freeze/thaw cycles today.  Patient tolerated procedure well. Reviewed with patient after-care instructions and when to call clinic. No sex until area has completely healed. Rec condoms with all sex RTC in 1 month for next treatment if needed.  Pt to e seen for HPV vaccine today.  See RN note.   Junious Dresser, FNP

## 2021-08-03 NOTE — Progress Notes (Signed)
No treatment required from nursing per provider. Pt to nurse clinic for HPV vaccine. Verbalizes understanding of follow up. Keitha Butte RN

## 2021-08-03 NOTE — Progress Notes (Signed)
In nurse clinic for HPV vaccine #3. Vaccine administered and tolerated well. Updated [facility] copy given and explained. Josie Saunders, RN

## 2021-10-10 ENCOUNTER — Other Ambulatory Visit: Payer: Self-pay

## 2021-10-17 ENCOUNTER — Ambulatory Visit: Payer: Self-pay | Admitting: Family Medicine

## 2021-10-17 DIAGNOSIS — A630 Anogenital (venereal) warts: Secondary | ICD-10-CM

## 2021-10-17 MED ORDER — IMIQUIMOD 5 % EX CREA: TOPICAL_CREAM | CUTANEOUS | 1 refills | Status: DC

## 2021-10-17 NOTE — Progress Notes (Signed)
Pt here for treatment of HPV only.  Condoms declined.  Windle Guard, RN

## 2021-10-25 ENCOUNTER — Other Ambulatory Visit: Payer: Self-pay

## 2021-10-25 NOTE — Progress Notes (Signed)
S:  Client declines STD screening  He is here today for cryo treatment.  His last treatment was 07/2021.  Client states that his wart have increased in size and number.    O:  Penis: several cluster of lesions noted on upper shaft and mid-shaft.  No discharge noted,  non-tender lesions.  A:  HPV treatment  P:  Cryotherapy to lesions.        Discussed options for treatment- offered RX  for Imquimod to use at home.  Client agrees to treatment.  Co to use imiquimod qod x 3 days/week x 8-12 weeks. RX -Imiquimod use 3 times/ week x 1 RF.  Co to use condoms always.

## 2021-10-27 ENCOUNTER — Ambulatory Visit: Payer: Self-pay | Admitting: Family Medicine

## 2021-10-27 DIAGNOSIS — B977 Papillomavirus as the cause of diseases classified elsewhere: Secondary | ICD-10-CM

## 2021-10-27 NOTE — Progress Notes (Signed)
S:  Client here today for cryo for HPV lesions.   States he bought Aldara ($170) and has used on lesions 1-2 times about 4 days ago.  States that he would like another cryo on lesions.  After today he'll use the Aldara for HPV treatment.  Denies concerns or problems with the lesions- no pain or discharge.  States that the lesions are smaller since last week's cryo treatment.  O: lesions are smaller, no irritation/pain on exam.  A: HPV treatment  P:  Cryo to lesions as per SO.  C client to wait 1 week before using the Aldara treatment 3 times/week.  Co to abstain from sex on days he applies Aldara.  Co to use condoms when he is sexually active.

## 2021-10-27 NOTE — Progress Notes (Signed)
Pt here for treatment of HPV only.  Condoms declined.

## 2021-11-22 ENCOUNTER — Ambulatory Visit: Payer: Self-pay | Admitting: Nurse Practitioner

## 2021-11-22 ENCOUNTER — Encounter: Payer: Self-pay | Admitting: Nurse Practitioner

## 2021-11-22 ENCOUNTER — Ambulatory Visit: Payer: Self-pay

## 2021-11-22 DIAGNOSIS — A630 Anogenital (venereal) warts: Secondary | ICD-10-CM

## 2021-11-22 NOTE — Progress Notes (Signed)
S: 41 year old male in clinic today for crytox.  Denies any current problems or signs or symptoms.  Has had previous treatments in the past without difficulty.  HPV improvement noted with treatment.     O: genital warts on penis    A: 1. Genital warts   P: WDWN male in NAD, A&O x 3; skin is warm and dry with genital warts.  No edema, erythema, or ulcerative lesions present today.  -Cryo treatment in 3 freeze/thaw cycles today.  Patient tolerated procedure well. -Reviewed with patient after-care instructions and when to call clinic. -No sex until area has completely healed. -Rec condoms with all sex. -RTC in 10-14 days for next treatment if needed. Gregary Cromer, FNP

## 2021-12-27 ENCOUNTER — Ambulatory Visit: Payer: Self-pay | Admitting: Nurse Practitioner

## 2021-12-27 DIAGNOSIS — A630 Anogenital (venereal) warts: Secondary | ICD-10-CM

## 2021-12-27 NOTE — Progress Notes (Signed)
S: 41 year old male in clinic today for crytox.  Denies any current problems or signs or symptoms.  Has had previous treatments in the past without difficulty.  HPV improvement noted with treatment.     O: genital warts on penis    A: 1. Genital warts   P: WDWN male in NAD, A&O x 3; skin is warm and dry with genital warts.  No edema, erythema, or ulcerative lesions present today.  -Cryo treatment in 3 freeze/thaw cycles today.  Patient tolerated procedure well. -Reviewed with patient after-care instructions and when to call clinic. -No sex until area has completely healed. -Rec condoms with all sex. -RTC in 10-14 days for next treatment if needed. Gregary Cromer, FNP

## 2021-12-28 ENCOUNTER — Encounter: Payer: Self-pay | Admitting: Nurse Practitioner

## 2022-01-08 ENCOUNTER — Encounter (INDEPENDENT_AMBULATORY_CARE_PROVIDER_SITE_OTHER): Payer: Self-pay

## 2022-01-22 ENCOUNTER — Ambulatory Visit: Payer: Self-pay

## 2022-01-23 ENCOUNTER — Encounter: Payer: Self-pay | Admitting: Oncology

## 2022-01-23 ENCOUNTER — Ambulatory Visit: Payer: Self-pay

## 2022-02-15 ENCOUNTER — Encounter: Payer: Self-pay | Admitting: Oncology

## 2022-02-19 ENCOUNTER — Ambulatory Visit: Payer: Self-pay | Admitting: Family Medicine

## 2022-02-19 ENCOUNTER — Encounter: Payer: Self-pay | Admitting: Family Medicine

## 2022-02-19 DIAGNOSIS — A630 Anogenital (venereal) warts: Secondary | ICD-10-CM

## 2022-02-19 NOTE — Progress Notes (Signed)
[facility] Department   41 yo Male presents to clinic for treatment of genital warts. HPV improved with previous treatment (on 12/27/21). States he has one cluster of warts that he "can't seem to get rid of".  Physical General: Well appearing male Skin: warm, dry Genital: 1 active wart visualized and treated near the base of the penis. Two other adjacent areas of scarring/ hyperkeratinized skin seen. Did not treat these areas as they do not appear to be active warts.   Genital Warts Cryo treatment today in 3 freeze/thaw cycles. Tolerated produced well. No sex until area healed. Encouraged condom use.   RTC in 10-14 days for next treatment if needed.  Sharlet Salina, [place]

## 2022-05-03 ENCOUNTER — Encounter: Payer: Self-pay | Admitting: Physician Assistant

## 2022-05-03 ENCOUNTER — Ambulatory Visit: Payer: Self-pay | Admitting: Physician Assistant

## 2022-05-03 DIAGNOSIS — Z113 Encounter for screening for infections with a predominantly sexual mode of transmission: Secondary | ICD-10-CM

## 2022-05-03 NOTE — Progress Notes (Signed)
[facility] Department     43 yo man presents to clinic for treatment of genital warts. HPV improved with previous treatment (on 02/19/22). States he has one cluster of warts that he "can't seem to get rid of".   Physical General: Well appearing male Skin: warm, dry Genital: 1 86m wart visualized and treated near the base of the penis. Two other adjacent areas of scarring/ hyperkeratinized skin (369mand 29m29m Also treated these areas they may be emerging warts.    Genital Warts Cryo treatment today in 3 freeze/thaw cycles. Pt tolerated produced well. No sex until area healed. Encouraged condom use. Note pt states he may be getting health insurance next month and may prefer self treatment at home - consider imiquimod Rx in future if pt desires. He states he cannot afford today.    RTC in 10-14 days for next treatment if needed. AnnLora HavensA-C

## 2022-05-21 ENCOUNTER — Ambulatory Visit: Payer: Self-pay | Admitting: Family Medicine

## 2022-05-21 ENCOUNTER — Encounter: Payer: Self-pay | Admitting: Family Medicine

## 2022-05-21 DIAGNOSIS — A630 Anogenital (venereal) warts: Secondary | ICD-10-CM

## 2022-05-21 NOTE — Progress Notes (Signed)
[facility] Department     42 yo man presents to clinic for treatment of genital warts. HPV improved with previous treatment (on 05/03/22)   Physical General: Well appearing male Skin: warm, dry Genital: 1 81m wart visualized and treated near the base of the penis. Two other adjacent areas of scarring/ hyperkeratinized skin (342mand 29m62m Also treated these areas they may be emerging warts.    Genital Warts Cryo treatment today in 3 freeze/thaw cycles. Pt tolerated produced well. No sex until area healed. Encouraged condom use.     RTC in 10-14 days for next treatment if needed. [facility]

## 2022-06-08 ENCOUNTER — Ambulatory Visit
Admission: EM | Admit: 2022-06-08 | Discharge: 2022-06-08 | Disposition: A | Discharge status: Home / Self Care | Payer: BC Managed Care – PPO | Attending: Emergency Medicine | Admitting: Emergency Medicine

## 2022-06-08 ENCOUNTER — Encounter: Payer: Self-pay | Admitting: Oncology

## 2022-06-08 ENCOUNTER — Encounter: Payer: Self-pay | Admitting: Emergency Medicine

## 2022-06-08 DIAGNOSIS — R062 Wheezing: Secondary | ICD-10-CM

## 2022-06-08 DIAGNOSIS — J069 Acute upper respiratory infection, unspecified: Secondary | ICD-10-CM

## 2022-06-08 MED ORDER — BENZONATATE 100 MG PO CAPS: 100.0000 mg | ORAL_CAPSULE | Freq: Three times a day (TID) | ORAL | 0 refills | Status: DC

## 2022-06-08 MED ORDER — PREDNISONE 20 MG PO TABS: 40.0000 mg | ORAL_TABLET | Freq: Every day | ORAL | 0 refills | Status: DC

## 2022-06-08 MED ORDER — GUAIFENESIN-CODEINE 100-10 MG/5ML PO SOLN: 5.0000 mL | Freq: Four times a day (QID) | ORAL | 0 refills | Status: DC | PRN

## 2022-06-08 NOTE — ED Provider Notes (Signed)
[facility]    CSN: [id number] Arrival date & time: 06/08/22  [id number]      History   Chief Complaint Chief Complaint  Patient presents with   Cough   Nasal Congestion    HPI Tristan [name] is a 42 y.o. male.   Patient presents for evaluation of nasal congestion, rhinorrhea, sore throat, body aches, nonproductive cough, chest congestion, shortness of breath and wheezing present for 2 days.  Wheezing is experienced at baseline, slightly worsened.  Shortness of breath is experienced when lying flat, having difficulty sleeping at nighttime.  Sore throat and bodyaches have resolved.  No known sick contacts prior.  Tolerating food and liquids.  Has attempted use of DayQuil.  History of asthma.  Daily tobacco use.    Past Medical History:  Diagnosis Date   Abdominal pain    Anemia    Asthma    Colon cancer ([place])    Family history of breast cancer    Family history of colon cancer    GERD (gastroesophageal reflux disease)    Nausea    Poor appetite    Weight loss     Patient Active Problem List   Diagnosis Date Noted   Personal history of colon cancer 04/18/2020   Family history of breast cancer    Family history of colon cancer    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[place]) 02/10/2019   Iron deficiency anemia 02/10/2019   Genital warts 07/15/2015    Past Surgical History:  Procedure Laterality Date   COLONOSCOPY N/A 01/30/2019   Procedure: COLONOSCOPY;  Surgeon: Jules Husbands, MD;  Location: [facility] ORS;  Service: General;  Laterality: N/A;   COLONOSCOPY WITH PROPOFOL N/A 08/16/2016   Procedure: COLONOSCOPY WITH PROPOFOL;  Surgeon: [name], MD;  Location: [facility] ENDOSCOPY;  Service: Endoscopy;  Laterality: N/A;   COLONOSCOPY WITH PROPOFOL N/A 01/30/2019   Procedure: COLONOSCOPY WITH PROPOFOL;  Surgeon: [name], MD;  Location: [facility] ENDOSCOPY;  Service: Gastroenterology;  Laterality: N/A;  TRAVEL CASE TO O.R. - PROCEDURE TO START AT 7:30 AM IN O.R.    COLONOSCOPY WITH PROPOFOL N/A 05/03/2020   Procedure: COLONOSCOPY WITH PROPOFOL;  Surgeon: [name], MD;  Location: [facility] ENDOSCOPY;  Service: Gastroenterology;  Laterality: N/A;   LAPAROSCOPIC RIGHT COLECTOMY N/A 01/30/2019   Procedure: LAPAROSCOPIC HAND ASSISTED RIGHT COLECTOMY CONVERTED TO OPEN PROCEDURE;  Surgeon: Jules Husbands, MD;  Location: [facility] ORS;  Service: General;  Laterality: N/A;   NO PAST SURGERIES     PORTACATH PLACEMENT Right 03/04/2019   Procedure: INSERTION PORT-A-CATH;  Surgeon: Jules Husbands, MD;  Location: [facility] ORS;  Service: General;  Laterality: Right;       Home Medications    Prior to Admission medications   Not on File    Family History Family History  Problem Relation Age of Onset   Diabetes Mother 53       Type 1   Breast cancer Mother 14   Healthy Father    Colon polyps Father    Ulcerative colitis Paternal Aunt    Multiple sclerosis Paternal Aunt    Dementia Paternal Uncle    Breast cancer Maternal Grandmother 85   Breast cancer Paternal Grandmother 63   Rectal cancer Maternal Great-grandmother    Colon cancer Cousin     Social History Social History   Tobacco Use   Smoking status: Every Day    Packs/day: 0.50    Years: 20.00    Additional pack years: 0.00  Total pack years: 10.00    Types: Cigarettes   Smokeless tobacco: Never  Vaping Use   Vaping Use: Never used  Substance Use Topics   Alcohol use: Yes    Alcohol/week: 3.0 standard drinks of alcohol    Types: 3 Standard drinks or equivalent per week    Comment: maybe 2-3 beers a month   Drug use: Yes    Types: Marijuana     Allergies   Patient has no known allergies.   Review of Systems Review of Systems  HENT:  Positive for congestion, rhinorrhea and sore throat. Negative for dental problem, drooling, ear discharge, ear pain, facial swelling, hearing loss, mouth sores, nosebleeds, postnasal drip, sinus pressure, sinus pain, sneezing, tinnitus, trouble swallowing  and voice change.   Respiratory:  Positive for cough, choking, shortness of breath and wheezing. Negative for apnea, chest tightness and stridor.   Cardiovascular: Negative.   Gastrointestinal: Negative.   Musculoskeletal:  Positive for myalgias. Negative for arthralgias, back pain, gait problem, joint swelling, neck pain and neck stiffness.  Skin: Negative.      Physical Exam Triage Vital Signs ED Triage Vitals  Enc Vitals Group     BP 06/08/22 0830 125/75     Pulse Rate 06/08/22 0830 91     Resp 06/08/22 0830 14     Temp 06/08/22 0830 98.2 F (36.8 C)     Temp Source 06/08/22 0830 Oral     SpO2 06/08/22 0830 98 %     Weight 06/08/22 0829 170 lb (77.1 kg)     Height 06/08/22 0829 6\' 1"  (1.854 m)     Head Circumference --      Peak Flow --      Pain Score 06/08/22 0829 0     Pain Loc --      Pain Edu? --      Excl. in [place]? --    No data found.  Updated Vital Signs BP 125/75 (BP Location: Right Arm)   Pulse 91   Temp 98.2 F (36.8 C) (Oral)   Resp 14   Ht 6\' 1"  (1.854 m)   Wt 170 lb (77.1 kg)   SpO2 98%   BMI 22.43 kg/m   Visual Acuity Right Eye Distance:   Left Eye Distance:   Bilateral Distance:    Right Eye Near:   Left Eye Near:    Bilateral Near:     Physical Exam Constitutional:      Appearance: Normal appearance.  HENT:     Head: Normocephalic.     Right Ear: Tympanic membrane, ear canal and external ear normal.     Left Ear: Tympanic membrane, ear canal and external ear normal.     Nose: Congestion and rhinorrhea present.     Mouth/Throat:     Mouth: Mucous membranes are moist.     Pharynx: Posterior oropharyngeal erythema present.  Cardiovascular:     Rate and Rhythm: Normal rate and regular rhythm.     Pulses: Normal pulses.     Heart sounds: Normal heart sounds.  Pulmonary:     Effort: Pulmonary effort is normal.     Breath sounds: Wheezing present.  Skin:    General: Skin is warm and dry.  Neurological:     Mental Status: He is alert  and oriented to person, place, and time. Mental status is at baseline.  Psychiatric:        Mood and Affect: Mood normal.  Behavior: Behavior normal.      UC Treatments / Results  Labs (all labs ordered are listed, but only abnormal results are displayed) Labs Reviewed - No data to display  EKG   Radiology No results found.  Procedures Procedures (including critical care time)  Medications Ordered in UC Medications - No data to display  Initial Impression / Assessment and Plan / UC Course  I have reviewed the triage vital signs and the nursing notes.  Pertinent labs & imaging results that were available during my care of the patient were reviewed by me and considered in my medical decision making (see chart for details).  Viral URI with cough, wheezing  Declined Viral testing, vital signs are stable patient is in no signs of distress nontoxic-appearing, wheezing is heard to auscultation, low suspicion for pneumonia therefore imaging deferred prescribed prednisone, Tessalon and guaifenesin codeine for outpatient use, PDMP reviewed, low risk, recommended continued use of over-the-counter medications if deemed helpful, may follow-up with his urgent care as needed Final Clinical Impressions(s) / UC Diagnoses   Final diagnoses:  None   Discharge Instructions   None    ED Prescriptions   None    PDMP not reviewed this encounter.   Hans Eden, [place] 06/08/22 [phone]

## 2022-06-08 NOTE — ED Triage Notes (Signed)
Patient c/o cough, chest congestion, runny nose, head pressure that started on Tuesday.  Patient denies fevers. Patient does not want to be tested for Flu or COVID.

## 2022-06-08 NOTE — Discharge Instructions (Signed)
Your symptoms today are most likely being caused by a virus and should steadily improve in time it can take up to 7 to 10 days before you truly start to see a turnaround however things will get better  On exam I am able to hear wheezing, begin prednisone every morning with food for 5 days to help reduce inflammation to your airway  You may use Tessalon Perles every 8 hours as needed to help calm your coughing  You may use cough syrup every 6 hours as needed to help with your cough congestion, please be mindful this will make you feel sleepy    You can take Tylenol and/or Ibuprofen as needed for fever reduction and pain relief.   For cough: honey 1/2 to 1 teaspoon (you can dilute the honey in water or another fluid).  You can also use guaifenesin and dextromethorphan for cough. You can use a humidifier for chest congestion and cough.  If you don't have a humidifier, you can sit in the bathroom with the hot shower running.      For sore throat: try warm salt water gargles, cepacol lozenges, throat spray, warm tea or water with lemon/honey, popsicles or ice, or OTC cold relief medicine for throat discomfort.   For congestion: take a daily anti-histamine like Zyrtec, Claritin, and a oral decongestant, such as pseudoephedrine.  You can also use Flonase 1-2 sprays in each nostril daily.   It is important to stay hydrated: drink plenty of fluids (water, gatorade/powerade/pedialyte, juices, or teas) to keep your throat moisturized and help further relieve irritation/discomfort.

## 2022-07-18 ENCOUNTER — Other Ambulatory Visit: Payer: Self-pay | Admitting: Hematology and Oncology

## 2022-07-19 ENCOUNTER — Ambulatory Visit: Payer: Self-pay

## 2022-08-08 ENCOUNTER — Ambulatory Visit: Payer: Self-pay

## 2023-03-14 NOTE — Telephone Encounter (Signed)
 Error

## 2023-04-23 ENCOUNTER — Encounter: Payer: Self-pay | Admitting: Emergency Medicine

## 2023-04-23 ENCOUNTER — Emergency Department
Admission: EM | Admit: 2023-04-23 | Discharge: 2023-04-23 | Disposition: A | Discharge status: Home / Self Care | Payer: Managed Care, Other (non HMO) | Attending: Emergency Medicine | Admitting: Emergency Medicine

## 2023-04-23 ENCOUNTER — Other Ambulatory Visit: Payer: Self-pay

## 2023-04-23 ENCOUNTER — Emergency Department: Payer: Managed Care, Other (non HMO)

## 2023-04-23 DIAGNOSIS — J45909 Unspecified asthma, uncomplicated: Secondary | ICD-10-CM | POA: Insufficient documentation

## 2023-04-23 DIAGNOSIS — S0993XA Unspecified injury of face, initial encounter: Secondary | ICD-10-CM | POA: Diagnosis present

## 2023-04-23 DIAGNOSIS — T07XXXA Unspecified multiple injuries, initial encounter: Secondary | ICD-10-CM

## 2023-04-23 DIAGNOSIS — Y990 Civilian activity done for income or pay: Secondary | ICD-10-CM | POA: Diagnosis not present

## 2023-04-23 DIAGNOSIS — S0181XA Laceration without foreign body of other part of head, initial encounter: Secondary | ICD-10-CM | POA: Diagnosis not present

## 2023-04-23 DIAGNOSIS — S01111A Laceration without foreign body of right eyelid and periocular area, initial encounter: Secondary | ICD-10-CM | POA: Insufficient documentation

## 2023-04-23 DIAGNOSIS — S01511A Laceration without foreign body of lip, initial encounter: Secondary | ICD-10-CM | POA: Diagnosis not present

## 2023-04-23 MED ORDER — LIDOCAINE HCL (PF) 1 % IJ SOLN
5.0000 mL | Freq: Once | INTRAMUSCULAR | Status: AC
  Administered 2023-04-23: 5 mL
  Filled 2023-04-23: qty 5

## 2023-04-23 MED ORDER — ACETAMINOPHEN 500 MG PO TABS
1000.0000 mg | ORAL_TABLET | Freq: Once | ORAL | Status: AC
  Administered 2023-04-23: 1000 mg via ORAL
  Filled 2023-04-23: qty 2

## 2023-04-23 MED ORDER — LIDOCAINE-EPINEPHRINE-TETRACAINE (LET) TOPICAL GEL
3.0000 mL | Freq: Once | TOPICAL | Status: AC
  Administered 2023-04-23: 3 mL via TOPICAL
  Filled 2023-04-23: qty 3
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
## 2023-04-23 NOTE — Discharge Instructions (Addendum)
Keep sutures dry for first 24 hrs. Keep suture site of clean & dry. Gently use soap & water after first 24 hrs. DO NOT USE alcohol, hydrogen peroxide etc, to clean skin. You may cover the incision with clean gauze & replace it after your daily shower for your comfort. If you have skin tapes (Steristrips) or skin glue (Dermabond) on your incision, leave them in place. They will fall off on their own like a scab in a few weeks.  You may trim any edges that curl up with clean scissors.   Gargle with warm salt water.  Return to ED or follow-up with your PCP in 7 to 10 days for suture removal.  Consider a liquid diet for the first 2 days to prevent further injury to lip injury.  Your CT head is normal.  However please follow these head concussion precautions stated below:  Limit screen time and loud music before bed. Sleep in a dark room and keep to a fixed bedtime and wake-up schedule. Avoid excessive physical activity and strenuous mental activities. Do not drive for at least 24 hours after a concussive injury. Wear protective sports equipment and your seatbelt when traveling to prevent another concussion. Apply ice or a cold pack to the affected area.  Take Tylenol and ibuprofen for pain/headache as needed

## 2023-08-07 ENCOUNTER — Ambulatory Visit (INDEPENDENT_AMBULATORY_CARE_PROVIDER_SITE_OTHER): Payer: Self-pay | Admitting: Surgery

## 2023-08-07 ENCOUNTER — Encounter: Payer: Self-pay | Admitting: Surgery

## 2023-08-07 VITALS — BP 132/89 | HR 102 | Temp 98.5°F | Ht 73.0 in | Wt 163.8 lb

## 2023-08-07 DIAGNOSIS — R1031 Right lower quadrant pain: Secondary | ICD-10-CM

## 2023-08-07 DIAGNOSIS — Z1211 Encounter for screening for malignant neoplasm of colon: Secondary | ICD-10-CM

## 2023-08-07 NOTE — Progress Notes (Signed)
 Outpatient Surgical Follow Up  08/07/2023  Tristan [name] is an 43 y.o. male.   Chief Complaint  Patient presents with   Follow-up    Right groin bulge/pain    HPI:   Patient underwent right hemicolectomy on 01/30/2019.  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  With invasion of visceral peritoneum with findings of peritonitis adhesions and abscesses.  Metastatic carcinoma in 2 out of 245 regional lymph nodes.  Margins were negative.  PT4PN1.  Appendix encased in adhesions with adjacent abscess.  MSI testing showed low probability of MSI high. 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  Ki-67 less than 3% Recovered well from right colectomy and has no issues so far  Adjuvant FOLFOX chemotherapy completed on 08/04/2019.  Patient is undergoing adjuvant radiation treatment as well given T4 lesion.  Neuroendocrine tumor is being monitored.  He completed radiation therapy as well. Recently endorses some intermittent right lower quadrant abdominal pain discomfort.  THe Pain is intermittent, mild no specific alleviating or aggravating factors. No recents images. Recent colonoscopy 2012 patent anastomosis and no other concerning lesions  Past Medical History:  Diagnosis Date   Abdominal pain    Anemia    Asthma    Colon cancer (HCC)    Family history of breast cancer    Family history of colon cancer    GERD (gastroesophageal reflux disease)    Nausea    Poor appetite    Weight loss     Past Surgical History:  Procedure Laterality Date   COLONOSCOPY N/A 01/30/2019   Procedure: COLONOSCOPY;  Surgeon: Alben Alma, MD;  Location: [facility] ORS;  Service: General;  Laterality: N/A;   COLONOSCOPY WITH PROPOFOL  N/A 08/16/2016   Procedure: COLONOSCOPY WITH PROPOFOL ;  Surgeon: Luke Salaam, MD;  Location: [facility] ENDOSCOPY;  Service: Endoscopy;  Laterality: N/A;   COLONOSCOPY WITH PROPOFOL   N/A 01/30/2019   Procedure: COLONOSCOPY WITH PROPOFOL ;  Surgeon: Luke Salaam, MD;  Location: [facility] ENDOSCOPY;  Service: Gastroenterology;  Laterality: N/A;  TRAVEL CASE TO O.R. - PROCEDURE TO START AT 7:30 AM IN O.R.   COLONOSCOPY WITH PROPOFOL  N/A 05/03/2020   Procedure: COLONOSCOPY WITH PROPOFOL ;  Surgeon: Luke Salaam, MD;  Location: [facility] ENDOSCOPY;  Service: Gastroenterology;  Laterality: N/A;   LAPAROSCOPIC RIGHT COLECTOMY N/A 01/30/2019   Procedure: LAPAROSCOPIC HAND ASSISTED RIGHT COLECTOMY CONVERTED TO OPEN PROCEDURE;  Surgeon: Alben Alma, MD;  Location: [facility] ORS;  Service: General;  Laterality: N/A;   NO PAST SURGERIES     PORTACATH PLACEMENT Right 03/04/2019   Procedure: INSERTION PORT-A-CATH;  Surgeon: Alben Alma, MD;  Location: [facility] ORS;  Service: General;  Laterality: Right;    Family History  Problem Relation Age of Onset   Diabetes Mother 72       Type 1   Breast cancer Mother 61   Healthy Father    Colon polyps Father    Ulcerative colitis Paternal Aunt    Multiple sclerosis Paternal Aunt    Dementia Paternal Uncle    Breast cancer Maternal Grandmother 67   Breast cancer Paternal Grandmother 99   Rectal cancer Maternal Great-grandmother    Colon cancer Cousin     Social History:  reports that he has been smoking cigarettes. He has a 10 pack-year smoking history. He has never used smokeless tobacco. He reports current alcohol use of about 3.0 standard drinks of alcohol per week. He reports current drug use. Drug:  Marijuana.  Allergies: No Known Allergies  Medications reviewed.    ROS Full ROS performed and is otherwise negative other than what is stated in HPI   BP 132/89   Pulse (!) 102   Temp 98.5 F (36.9 C) (Oral)   Ht 6\' 1"  (1.854 m)   Wt 163 lb 12.8 oz (74.3 kg)   SpO2 94%   BMI 21.61 kg/m   Physical Exam Vitals and nursing note reviewed.  Constitutional:      General: He is not in acute distress.    Appearance: Normal appearance. He is  normal weight. He is not toxic-appearing.  Cardiovascular:     Rate and Rhythm: Normal rate and regular rhythm.     Heart sounds: No murmur heard. Pulmonary:     Effort: Pulmonary effort is normal. No respiratory distress.     Breath sounds: Normal breath sounds. No stridor. No wheezing.  Abdominal:     General: Abdomen is flat. There is no distension.     Palpations: Abdomen is soft. There is no mass.     Tenderness: There is no abdominal tenderness. There is no guarding or rebound.     Hernia: No hernia is present.  Musculoskeletal:        General: No swelling or tenderness. Normal range of motion.     Cervical back: Normal range of motion and neck supple. No rigidity or tenderness.  Lymphadenopathy:     Cervical: No cervical adenopathy.  Skin:    General: Skin is warm and dry.     Capillary Refill: Capillary refill takes less than 2 seconds.  Neurological:     General: No focal deficit present.     Mental Status: He is alert and oriented to person, place, and time.  Psychiatric:        Mood and Affect: Mood normal.        Behavior: Behavior normal.        Thought Content: Thought content normal.        Judgment: Judgment normal.    Assessment/Plan: 43 year old male with history of adenocarcinoma of the right colon status post right colectomy radiation therapy and adjuvant chemotherapy.  He presents now with right lower quadrant abdominal pain.  He needs complete workup.  Will start with a CT scan of the abdomen pelvis, colonoscopy and basic lab work to include a CEA CBC and CMP.  Exam I cannot really palpate hernias although is limited due to scarring and prior radiation changes. No need For urgent intervention or hospitalization at this time I spent 40 minutes in this encounter doing extensive review of medical records, images studies, coordinating his care, placing orders and performing appropriate documentation.   Evelia Hipp, MD [facility] General Surgeon

## 2023-08-07 NOTE — Patient Instructions (Addendum)
 We have scheduled you for a CT Scan of your Abdomen and Pelvis with contrast. This has been scheduled at [facility] on 08/14/2023 . Please arrive there by 12:45 pm for a 3 pm . If you need to reschedule your Scan, you may do so by calling [phone]. Please let us  know if you reschedule your scan as we have to get authorization from your insurance for this.    A referral has been placed to [facility] for a Colonoscopy. They will call you.   Please go to [facility] to have labs drawn. You do not need an appointment.

## 2023-08-12 ENCOUNTER — Telehealth: Payer: Self-pay

## 2023-08-12 ENCOUNTER — Other Ambulatory Visit: Payer: Self-pay

## 2023-08-12 DIAGNOSIS — Z85038 Personal history of other malignant neoplasm of large intestine: Secondary | ICD-10-CM

## 2023-08-12 DIAGNOSIS — Z800 Family history of malignant neoplasm of digestive organs: Secondary | ICD-10-CM

## 2023-08-12 MED ORDER — NA SULFATE-K SULFATE-MG SULF 17.5-3.13-1.6 GM/177ML PO SOLN: 1.0000 | Freq: Once | ORAL | 0 refills | Status: AC

## 2023-08-12 NOTE — Telephone Encounter (Signed)
 Gastroenterology Pre-Procedure Review  Request Date: 08/21/23 Requesting Physician: Dr. Cornel Diesel  PATIENT REVIEW QUESTIONS: The patient responded to the following health history questions as indicated:    1. Are you having any GI issues? no 2. Do you have a personal history of Polyps? Personal history of colon cancer 2020.  Last colonoscopy 05/03/20 performed by Dr. Antony Baumgartner recommended repeat in 3 years 11/13/2018 office visit noted that pt does have "a very tight and tortuous sigmoid colon" 3. Do you have a family history of Colon Cancer or Polyps? yes (family history of colon cancer noted specific family member not recalled) 4. Diabetes Mellitus? no 5. Joint replacements in the past 12 months?no 6. Major health problems in the past 3 months?no 7. Any artificial heart valves, MVP, or defibrillator?no    MEDICATIONS & ALLERGIES:    Patient reports the following regarding taking any anticoagulation/antiplatelet therapy:   Plavix, Coumadin, Eliquis, Xarelto, Lovenox , Pradaxa, Brilinta, or Effient? no Aspirin? no  Patient confirms/reports the following medications:  No current outpatient medications on file.   No current facility-administered medications for this visit.    Patient confirms/reports the following allergies:  No Known Allergies  No orders of the defined types were placed in this encounter.   AUTHORIZATION INFORMATION Primary Insurance: 1D#: Group #:  Secondary Insurance: 1D#: Group #:  SCHEDULE INFORMATION: Date: 08/21/23 Time: Location: [facility]

## 2023-08-14 ENCOUNTER — Ambulatory Visit: Payer: Self-pay

## 2023-08-14 ENCOUNTER — Ambulatory Visit
Admission: RE | Admit: 2023-08-14 | Discharge: 2023-08-14 | Disposition: A | Discharge status: Home / Self Care | Source: Ambulatory Visit | Attending: Surgery | Admitting: Surgery

## 2023-08-14 DIAGNOSIS — R1031 Right lower quadrant pain: Secondary | ICD-10-CM | POA: Diagnosis present

## 2023-08-14 MED ORDER — IOHEXOL 300 MG/ML  SOLN
100.0000 mL | Freq: Once | INTRAMUSCULAR | Status: AC | PRN
  Administered 2023-08-14: 100 mL via INTRAVENOUS

## 2023-08-14 NOTE — Telephone Encounter (Signed)
-----   Message from [facility] sent at 08/14/2023  3:32 PM EDT ----- Please let him know CT did not show anything acute ----- Message ----- From: Interface, Rad Results In Sent: 08/14/2023   3:08 PM EDT To: Alben Alma, MD

## 2023-08-14 NOTE — Telephone Encounter (Signed)
 Called and spoke with the patient and let him know nothing worry some was found on CT. He is aware to have his lab work completed.

## 2023-08-21 ENCOUNTER — Encounter
Admission: RE | Disposition: A | Discharge status: Home / Self Care | Payer: Self-pay | Source: Home / Self Care | Attending: General Surgery

## 2023-08-21 ENCOUNTER — Ambulatory Visit
Admission: RE | Admit: 2023-08-21 | Discharge: 2023-08-21 | Disposition: A | Discharge status: Home / Self Care | Attending: General Surgery | Admitting: General Surgery

## 2023-08-21 ENCOUNTER — Ambulatory Visit: Admitting: Certified Registered Nurse Anesthetist

## 2023-08-21 DIAGNOSIS — Z1211 Encounter for screening for malignant neoplasm of colon: Secondary | ICD-10-CM | POA: Diagnosis present

## 2023-08-21 DIAGNOSIS — Q438 Other specified congenital malformations of intestine: Secondary | ICD-10-CM | POA: Diagnosis not present

## 2023-08-21 DIAGNOSIS — Z800 Family history of malignant neoplasm of digestive organs: Secondary | ICD-10-CM

## 2023-08-21 DIAGNOSIS — Z85038 Personal history of other malignant neoplasm of large intestine: Secondary | ICD-10-CM | POA: Diagnosis not present

## 2023-08-21 DIAGNOSIS — F17210 Nicotine dependence, cigarettes, uncomplicated: Secondary | ICD-10-CM | POA: Diagnosis not present

## 2023-08-21 HISTORY — PX: COLONOSCOPY: SHX5424

## 2023-08-21 SURGERY — COLONOSCOPY
Anesthesia: General

## 2023-08-21 MED ORDER — SODIUM CHLORIDE 0.9 % IV SOLN
INTRAVENOUS | Status: DC
  Administered 2023-08-21: 20 mL/h via INTRAVENOUS

## 2023-08-21 MED ORDER — PROPOFOL 10 MG/ML IV BOLUS
INTRAVENOUS | Status: AC
  Filled 2023-08-21: qty 20

## 2023-08-21 MED ORDER — PROPOFOL 500 MG/50ML IV EMUL
INTRAVENOUS | Status: DC | PRN
  Administered 2023-08-21: 180 ug/kg/min via INTRAVENOUS

## 2023-08-21 MED ORDER — LIDOCAINE HCL (CARDIAC) PF 100 MG/5ML IV SOSY
PREFILLED_SYRINGE | INTRAVENOUS | Status: DC | PRN
  Administered 2023-08-21: 50 mg via INTRAVENOUS

## 2023-08-21 MED ORDER — PROPOFOL 10 MG/ML IV BOLUS
INTRAVENOUS | Status: DC | PRN
Start: 2023-08-21 — End: 2023-08-21
  Administered 2023-08-21: 60 mg via INTRAVENOUS

## 2023-08-21 MED ORDER — MIDAZOLAM HCL 2 MG/2ML IJ SOLN
INTRAMUSCULAR | Status: AC
  Filled 2023-08-21: qty 2

## 2023-08-21 MED ORDER — DEXMEDETOMIDINE HCL IN NACL 80 MCG/20ML IV SOLN
INTRAVENOUS | Status: DC | PRN
  Administered 2023-08-21: 8 ug via INTRAVENOUS

## 2023-08-21 MED ORDER — MIDAZOLAM HCL 2 MG/2ML IJ SOLN
INTRAMUSCULAR | Status: DC | PRN
  Administered 2023-08-21: 2 mg via INTRAVENOUS

## 2023-08-21 NOTE — Anesthesia Preprocedure Evaluation (Addendum)
 Anesthesia Evaluation  Patient identified by MRN, date of birth, ID band Patient awake    Reviewed: Allergy & Precautions, NPO status , Patient's Chart, lab work & pertinent test results  History of Anesthesia Complications Negative for: history of anesthetic complications  Airway Mallampati: I   Neck ROM: Full    Dental  (+) Missing   Pulmonary asthma , Current Smoker (1/2 - 1 ppd)Patient did not abstain from smoking.   Pulmonary exam normal breath sounds clear to auscultation       Cardiovascular Exercise Tolerance: Good negative cardio ROS Normal cardiovascular exam Rhythm:Regular Rate:Normal     Neuro/Psych Daily marijuana use    GI/Hepatic ,GERD  ,,Colon CA   Endo/Other  negative endocrine ROS    Renal/GU negative Renal ROS     Musculoskeletal   Abdominal   Peds  Hematology  (+) Blood dyscrasia, anemia   Anesthesia Other Findings   Reproductive/Obstetrics                             Anesthesia Physical Anesthesia Plan  ASA: 2  Anesthesia Plan: General   Post-op Pain Management:    Induction: Intravenous  PONV Risk Score and Plan: 1 and Propofol  infusion, TIVA and Treatment may vary due to age or medical condition  Airway Management Planned: Natural Airway  Additional Equipment:   Intra-op Plan:   Post-operative Plan:   Informed Consent: I have reviewed the patients History and Physical, chart, labs and discussed the procedure including the risks, benefits and alternatives for the proposed anesthesia with the patient or authorized representative who has indicated his/her understanding and acceptance.       Plan Discussed with: CRNA  Anesthesia Plan Comments: (LMA/GETA backup discussed.  Patient consented for risks of anesthesia including but not limited to:  - adverse reactions to medications - damage to eyes, teeth, lips or other oral mucosa - nerve damage  due to positioning  - sore throat or hoarseness - damage to heart, brain, nerves, lungs, other parts of body or loss of life  Informed patient about role of CRNA in peri- and intra-operative care.  Patient voiced understanding.)       Anesthesia Quick Evaluation

## 2023-08-21 NOTE — Discharge Instructions (Signed)
YOU HAD AN ENDOSCOPIC PROCEDURE TODAY: Refer to the procedure report that was given to you for any specific questions about what was found during the examination.  If the procedure report does not answer your questions, please call your gastroenterologist to clarify. ° °YOU SHOULD EXPECT: Some feelings of bloating in the abdomen. Passage of more gas than usual.  Walking can help get rid of the air that was put into your GI tract during the procedure and reduce the bloating. If you had a lower endoscopy (such as a colonoscopy or flexible sigmoidoscopy) you may notice spotting of blood in your stool or on the toilet paper.  ° °DIET: Your first meal following the procedure should be a light meal and then it is ok to progress to your normal diet.  A half-sandwich or bowl of soup is an example of a good first meal.  Heavy or fried foods are harder to digest and may make you feel nasueas or bloated.  Drink plenty of fluids but you should avoid alcoholic beverages for 24 hours. ° °ACTIVITY: Your care partner should take you home directly after the procedure.  You should plan to take it easy, moving slowly for the rest of the day.  You can resume normal activity the day after the procedure however you should NOT DRIVE, make legal decisions or use heavy machinery for 24 hours (because of the sedation medicines used during the test).   ° °SYMPTOMS TO REPORT IMMEDIATELY  °A gastroenterologist can be reached at any hour.  Please call your doctor's office for any of the following symptoms: ° °· Following lower endoscopy (colonoscopy, flexible sigmoidoscopy) ° Excessive amounts of blood in the stool ° Significant tenderness, worsening of abdominal pains ° Swelling of the abdomen that is new, acute ° Fever of 100° or higher °· Following upper endoscopy (EGD, EUS, ERCP) ° Vomiting of blood or coffee ground material ° New, significant abdominal pain ° New, significant chest pain or pain under the shoulder blades ° Painful or  persistently difficult swallowing ° New shortness of breath ° Black, tarry-looking stools ° °FOLLOW UP: °If any biopsies were taken you will be contacted by phone or by letter within the next 1-3 weeks.  Call your gastroenterologist if you have not heard about the biopsies in 3 weeks.  ° °Please also call your gastroenterologist's office with any specific questions about appointments or follow up tests. °

## 2023-08-21 NOTE — Anesthesia Procedure Notes (Signed)
 Procedure Name: MAC Date/Time: 08/21/2023 11:21 AM  Performed by: Delice Felt, CRNAPre-anesthesia Checklist: Patient identified, Emergency Drugs available, Suction available and Patient being monitored Patient Re-evaluated:Patient Re-evaluated prior to induction Oxygen Delivery Method: Nasal cannula Induction Type: IV induction Placement Confirmation: positive ETCO2

## 2023-08-21 NOTE — Transfer of Care (Signed)
 Immediate Anesthesia Transfer of Care Note  Patient: Tristan [name]  Procedure(s) Performed: COLONOSCOPY  Patient Location: PACU and Endoscopy Unit  Anesthesia Type:General  Level of Consciousness: drowsy and patient cooperative  Airway & Oxygen Therapy: Patient Spontanous Breathing  Post-op Assessment: Report given to RN and Post -op Vital signs reviewed and stable  Post vital signs: Reviewed and stable  Last Vitals:  Vitals Value Taken Time  BP 91/45 08/21/23 1222  Temp 36.2 C 08/21/23 1222  Pulse 68 08/21/23 1222  Resp 18 08/21/23 1222  SpO2 59 % 08/21/23 1222    Last Pain:  Vitals:   08/21/23 1222  TempSrc: Temporal  PainSc: Asleep         Complications: No notable events documented.

## 2023-08-21 NOTE — Op Note (Addendum)
 [facility] Patient Name: Tristan [name] Procedure Date: 08/21/2023 11:22 AM MRN: [id number] Account #: [id number] Date of Birth: 1981/01/07 Admit Type: Outpatient Age: 43 Room: [facility] ENDO ROOM 1 Gender: Male Note Status: Supervisor Override Instrument Name: [id number] Procedure:             Colonoscopy Indications:           High risk colon cancer surveillance: Personal history                         of colon cancer Providers:             Jayson KIDD. Marinda, MD Referring MD:          No Local Md, MD (Referring MD) Medicines:             See the Anesthesia note for documentation of the                         administered medications Complications:         No immediate complications. Procedure:             Pre-Anesthesia Assessment:                        - Prior to the procedure, a History and Physical was                         performed, and patient medications and allergies were                         reviewed. The patient's tolerance of previous                         anesthesia was also reviewed. The risks and benefits                         of the procedure and the sedation options and risks                         were discussed with the patient. All questions were                         answered, and informed consent was obtained. Prior                         Anticoagulants: The patient has taken no anticoagulant                         or antiplatelet agents. ASA Grade Assessment: III - A                         patient with severe systemic disease. After reviewing                         the risks and benefits, the patient was deemed in                         satisfactory condition to undergo the procedure.  After obtaining informed consent, the colonoscope was                         passed under direct vision. Throughout the procedure,                         the patient's blood pressure, pulse, and  oxygen                         saturations were monitored continuously. The                         Colonoscope was introduced through the anus and                         advanced to the the ileocolonic anastomosis. The                         colonoscopy was technically difficult and complex due                         to significant looping. Successful completion of the                         procedure was aided by withdrawing the scope and                         replacing with the pediatric endoscope. The patient                         tolerated the procedure well. The quality of the bowel                         preparation was good. Findings:      The entire examined colon appeared normal. Impression:            - The entire examined colon is normal.                        - No specimens collected.                        - Tortuous colon. Recommendation:        - Repeat colonoscopy in 5 years for surveillance based                         on personal history of colon cancer. Procedure Code(s):     --- Professional ---                        [phone], Colonoscopy, flexible; diagnostic, including                         collection of specimen(s) by brushing or washing, when                         performed (separate procedure) CPT copyright 2022 American Medical Association. All rights reserved. The codes documented in this report are preliminary and upon coder review may  be revised to meet  current compliance requirements. Attending Participation:      This operation could not have been safely performed (without       compromising the technical results or length of the procedure) without       the assistance of a skilled surgical assistant. A surgical assistant was       medically necessary for positioning, retraction, and instrumentation. Jayson MALVA Endow, MD 08/21/2023 12:26:21 PM Number of Addenda: 0 Note Initiated On: 08/21/2023 11:22 AM Scope Withdrawal Time: 0 hours 6  minutes 44 seconds  Total Procedure Duration: 0 hours 43 minutes 20 seconds  Estimated Blood Loss:  Estimated blood loss: none.      [facility]

## 2023-08-21 NOTE — H&P (Signed)
 Primary Care Physician:  Patient, No Pcp Per Primary Gastroenterologist:  Dr. Cornel Diesel  Pre-Procedure History & Physical: HPI:  Tristan [name] is a 43 y.o. male is here for an colonoscopy.   Past Medical History:  Diagnosis Date   Abdominal pain    Anemia    Asthma    Colon cancer (HCC)    Family history of breast cancer    Family history of colon cancer    GERD (gastroesophageal reflux disease)    Nausea    Poor appetite    Weight loss     Past Surgical History:  Procedure Laterality Date   COLONOSCOPY N/A 01/30/2019   Procedure: COLONOSCOPY;  Surgeon: Alben Alma, MD;  Location: [facility] ORS;  Service: General;  Laterality: N/A;   COLONOSCOPY WITH PROPOFOL  N/A 08/16/2016   Procedure: COLONOSCOPY WITH PROPOFOL ;  Surgeon: Luke Salaam, MD;  Location: [facility] ENDOSCOPY;  Service: Endoscopy;  Laterality: N/A;   COLONOSCOPY WITH PROPOFOL  N/A 01/30/2019   Procedure: COLONOSCOPY WITH PROPOFOL ;  Surgeon: Luke Salaam, MD;  Location: [facility] ENDOSCOPY;  Service: Gastroenterology;  Laterality: N/A;  TRAVEL CASE TO O.R. - PROCEDURE TO START AT 7:30 AM IN O.R.   COLONOSCOPY WITH PROPOFOL  N/A 05/03/2020   Procedure: COLONOSCOPY WITH PROPOFOL ;  Surgeon: Luke Salaam, MD;  Location: [facility] ENDOSCOPY;  Service: Gastroenterology;  Laterality: N/A;   LAPAROSCOPIC RIGHT COLECTOMY N/A 01/30/2019   Procedure: LAPAROSCOPIC HAND ASSISTED RIGHT COLECTOMY CONVERTED TO OPEN PROCEDURE;  Surgeon: Alben Alma, MD;  Location: [facility] ORS;  Service: General;  Laterality: N/A;   NO PAST SURGERIES     PORTACATH PLACEMENT Right 03/04/2019   Procedure: INSERTION PORT-A-CATH;  Surgeon: Alben Alma, MD;  Location: [facility] ORS;  Service: General;  Laterality: Right;    Prior to Admission medications   Not on File    Allergies as of 08/12/2023   (No Known Allergies)    Family History  Problem Relation Age of Onset   Diabetes Mother 51       Type 1   Breast cancer Mother 3   Healthy Father    Colon polyps Father     Ulcerative colitis Paternal Aunt    Multiple sclerosis Paternal Aunt    Dementia Paternal Uncle    Breast cancer Maternal Grandmother 12   Breast cancer Paternal Grandmother 87   Rectal cancer Maternal Great-grandmother    Colon cancer Cousin     Social History   Socioeconomic History   Marital status: Single    Spouse name: Not on file   Number of children: Not on file   Years of education: Not on file   Highest education level: Not on file  Occupational History   Not on file  Tobacco Use   Smoking status: Every Day    Current packs/day: 0.50    Average packs/day: 0.5 packs/day for 20.0 years (10.0 ttl pk-yrs)    Types: Cigarettes   Smokeless tobacco: Never  Vaping Use   Vaping status: Never Used  Substance and Sexual Activity   Alcohol use: Yes    Alcohol/week: 3.0 standard drinks of alcohol    Types: 3 Standard drinks or equivalent per week    Comment: maybe 2-3 beers a month   Drug use: Yes    Types: Marijuana   Sexual activity: Yes    Birth control/protection: Condom  Other Topics Concern   Not on file  Social History Narrative   Not on file   Social Drivers of Health   Financial  Resource Strain: Not on file  Food Insecurity: Not on file  Transportation Needs: Not on file  Physical Activity: Not on file  Stress: Not on file  Social Connections: Not on file  Intimate Partner Violence: Not on file    Review of Systems: See HPI, otherwise negative ROS  Physical Exam: BP 126/80   Pulse 72   Temp (!) 96.9 F (36.1 C) (Temporal)   Resp 20   Ht 6' 1 (1.854 m)   Wt 71.1 kg   SpO2 100%   BMI 20.69 kg/m  General:   Alert,  pleasant and cooperative in NAD Head:  Normocephalic and atraumatic. Neck:  Supple; no masses or thyromegaly. Lungs:  Clear throughout to auscultation.    Heart:  Regular rate and rhythm. Abdomen:  Soft, nontender and nondistended. Normal bowel sounds, without guarding, and without rebound.   Neurologic:  Alert and  oriented x4;   grossly normal neurologically.  Impression/Plan: CHRISTHOPER BUSBEE is here for an colonoscopy to be performed for history of colon cancer  Risks, benefits, limitations, and alternatives regarding  colonoscopy have been reviewed with the patient.  Questions have been answered.  All parties agreeable.   Barrett Lick, MD  08/21/2023, 11:27 AM

## 2023-08-21 NOTE — Anesthesia Postprocedure Evaluation (Signed)
 Anesthesia Post Note  Patient: Tristan [name]  Procedure(s) Performed: COLONOSCOPY  Patient location during evaluation: Endoscopy Anesthesia Type: General Level of consciousness: awake and alert Pain management: pain level controlled Vital Signs Assessment: post-procedure vital signs reviewed and stable Respiratory status: spontaneous breathing, nonlabored ventilation, respiratory function stable and patient connected to nasal cannula oxygen Cardiovascular status: blood pressure returned to baseline and stable Postop Assessment: no apparent nausea or vomiting Anesthetic complications: no   No notable events documented.   Last Vitals:  Vitals:   08/21/23 1222 08/21/23 1232  BP: (!) 91/45 (!) 101/53  Pulse: 68 71  Resp: 18 14  Temp: (!) 36.2 C (!) 36.2 C  SpO2: (!) 59% (!) 67%    Last Pain:  Vitals:   08/21/23 1232  TempSrc: Temporal  PainSc: 0-No pain                 Nancey Awkward

## 2023-08-22 ENCOUNTER — Encounter: Payer: Self-pay | Admitting: General Surgery
